# Patient Record
Sex: Female | Born: 1990 | Race: Black or African American | Hispanic: No | Marital: Single
Health system: Southern US, Community
[De-identification: ages and names within clinical notes are randomized; demographics above are authoritative.]

## PROBLEM LIST (undated history)

## (undated) ENCOUNTER — Inpatient Hospital Stay (HOSPITAL_COMMUNITY): Payer: Self-pay

## (undated) ENCOUNTER — Inpatient Hospital Stay (HOSPITAL_COMMUNITY): Payer: Medicaid Other

## (undated) ENCOUNTER — Ambulatory Visit (HOSPITAL_COMMUNITY)
Admission: RE | Disposition: A | Discharge status: Other Acute Inpatient Hospital | Payer: Medicaid Other | Source: Ambulatory Visit

## (undated) DIAGNOSIS — Z8619 Personal history of other infectious and parasitic diseases: Secondary | ICD-10-CM

## (undated) DIAGNOSIS — F419 Anxiety disorder, unspecified: Secondary | ICD-10-CM

## (undated) DIAGNOSIS — A749 Chlamydial infection, unspecified: Secondary | ICD-10-CM

## (undated) DIAGNOSIS — Z789 Other specified health status: Secondary | ICD-10-CM

## (undated) HISTORY — DX: Personal history of other infectious and parasitic diseases: Z86.19

## (undated) HISTORY — DX: Anxiety disorder, unspecified: F41.9

## (undated) HISTORY — PX: NO PAST SURGERIES: SHX2092

## (undated) HISTORY — DX: Other specified health status: Z78.9

---
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
## 2013-07-22 ENCOUNTER — Encounter (HOSPITAL_COMMUNITY): Payer: Self-pay | Admitting: Emergency Medicine

## 2013-07-22 ENCOUNTER — Emergency Department (HOSPITAL_COMMUNITY)
Admission: EM | Admit: 2013-07-22 | Discharge: 2013-07-22 | Disposition: A | Discharge status: Home / Self Care | Payer: No Typology Code available for payment source | Attending: Emergency Medicine | Admitting: Emergency Medicine

## 2013-07-22 ENCOUNTER — Emergency Department (HOSPITAL_COMMUNITY): Payer: No Typology Code available for payment source

## 2013-07-22 DIAGNOSIS — B9789 Other viral agents as the cause of diseases classified elsewhere: Secondary | ICD-10-CM

## 2013-07-22 DIAGNOSIS — F17200 Nicotine dependence, unspecified, uncomplicated: Secondary | ICD-10-CM | POA: Insufficient documentation

## 2013-07-22 DIAGNOSIS — Z79899 Other long term (current) drug therapy: Secondary | ICD-10-CM | POA: Insufficient documentation

## 2013-07-22 DIAGNOSIS — Z880 Allergy status to penicillin: Secondary | ICD-10-CM | POA: Insufficient documentation

## 2013-07-22 DIAGNOSIS — J069 Acute upper respiratory infection, unspecified: Secondary | ICD-10-CM | POA: Insufficient documentation

## 2013-07-22 MED ORDER — BENZONATATE 100 MG PO CAPS: 100.0000 mg | ORAL_CAPSULE | Freq: Three times a day (TID) | ORAL | Status: DC

## 2013-07-22 NOTE — ED Notes (Signed)
Patient transported to X-ray 

## 2013-07-22 NOTE — Discharge Instructions (Signed)
Cough, Adult  A cough is a reflex that helps clear your throat and airways. It can help heal the body or may be a reaction to an irritated airway. A cough may only last 2 or 3 weeks (acute) or may last more than 8 weeks (chronic).  CAUSES Acute cough:  Viral or bacterial infections. Chronic cough:  Infections.  Allergies.  Asthma.  Post-nasal drip.  Smoking.  Heartburn or acid reflux.  Some medicines.  Chronic lung problems (COPD).  Cancer. SYMPTOMS   Cough.  Fever.  Chest pain.  Increased breathing rate.  High-pitched whistling sound when breathing (wheezing).  Colored mucus that you cough up (sputum). TREATMENT   A bacterial cough may be treated with antibiotic medicine.  A viral cough must run its course and will not respond to antibiotics.  Your caregiver may recommend other treatments if you have a chronic cough. HOME CARE INSTRUCTIONS   Only take over-the-counter or prescription medicines for pain, discomfort, or fever as directed by your caregiver. Use cough suppressants only as directed by your caregiver.  Use a cold steam vaporizer or humidifier in your bedroom or home to help loosen secretions.  Sleep in a semi-upright position if your cough is worse at night.  Rest as needed.  Stop smoking if you smoke. SEEK IMMEDIATE MEDICAL CARE IF:   You have pus in your sputum.  Your cough starts to worsen.  You cannot control your cough with suppressants and are losing sleep.  You begin coughing up blood.  You have difficulty breathing.  You develop pain which is getting worse or is uncontrolled with medicine.  You have a fever. MAKE SURE YOU:   Understand these instructions.  Will watch your condition.  Will get help right away if you are not doing well or get worse. Document Released: 11/26/2010 Document Revised: 08/22/2011 Document Reviewed: 11/26/2010 [facility] Patient Information 2014 [place], [place]. Your chest x-ray is normal.  You  have been given a prescription for Tessalon Perles.  Please take these as directed for coughing episodes

## 2013-07-22 NOTE — ED Notes (Addendum)
Pt reports having a productive cough with green mucus for one month. Pt reports also having congestion in her nose and ears. Pt reports chills and headache. Pt is A/O x4, in NAD, and vitals are WDL.

## 2013-07-22 NOTE — ED Provider Notes (Signed)
CSN: [id number]     Arrival date & time 07/22/13  1831 History  This chart was scribed for non-physician practitioner Earley FavorGail Talbot Monarch, NP working with Junius ArgyleForrest S Harrison, MD by Joaquin MusicKristina Sanchez-Matthews, ED Scribe. This patient was seen in room WTR9/WTR9 and the patient's care was started at 9:41 PM .   Chief Complaint  Patient presents with  . Cough  . Nasal Congestion   Patient is a 23 y.o. female presenting with cough. The history is provided by the patient. No language interpreter was used.  Cough  HPI Comments: Veronica Jones is a 23 y.o. female who presents to the Emergency Department complaining of ongoing worsening persistent productive cough with yellow sputum and congestion with associated subjective fever and HA that began 1 month ago. She states 1 month ago, she was having a sore throat and cough but states "her sore throat went away". Pt states she has been taking OTC Night Quil at night and Halls Cough Drops but denies taking any OTC medications during the day.Pt states she had subjective fever yesterday evening.   Pt states her LNMP was 2 weeks ago.  History reviewed. No pertinent past medical history. History reviewed. No pertinent past surgical history. No family history on file. History  Substance Use Topics  . Smoking status: Current Some Day Smoker -- 0.01 packs/day for 1 years    Types: Cigarettes  . Smokeless tobacco: Never Used  . Alcohol Use: Yes     Comment: Socially    OB History   Grav Para Term Preterm Abortions TAB SAB Ect Mult Living                 Review of Systems  Respiratory: Positive for cough.     Allergies  Amoxicillin and Penicillins  Home Medications   Current Outpatient Rx  Name  Route  Sig  Dispense  Refill  . Pseudoeph-Doxylamine-DM-APAP (NYQUIL PO)   Oral   Take 20 mLs by mouth 2 (two) times daily.          Triage Vitals:BP 114/73  Pulse 85  Temp(Src) 98.9 F (37.2 C) (Oral)  Resp 18  SpO2 99%  LMP 07/15/2013  Physical Exam   Nursing note and vitals reviewed. Constitutional: She is oriented to person, place, and time. She appears well-developed and well-nourished. No distress.  HENT:  Head: Normocephalic and atraumatic.  Right Ear: External ear normal.  Left Ear: External ear normal.  Nose: Nose normal.  Mouth/Throat: Oropharynx is clear and moist.  Throat red but no exudate.   Eyes: EOM are normal.  Neck: Neck supple. No tracheal deviation present.  Cardiovascular: Normal rate.   Pulmonary/Chest: Effort normal. No respiratory distress.  Musculoskeletal: Normal range of motion.  Neurological: She is alert and oriented to person, place, and time.  Skin: Skin is warm and dry.  Psychiatric: She has a normal mood and affect. Her behavior is normal.    ED Course  Procedures DIAGNOSTIC STUDIES: Oxygen Saturation is 99% on RA, normal by my interpretation.    COORDINATION OF CARE: 9:45 PM-Discussed treatment plan which includes CXR. Pt agreed to plan.   Labs Review Labs Reviewed - No data to display Imaging Review No results found.  EKG Interpretation   None      MDM   Final diagnoses:  None       I personally performed the services described in this documentation, which was scribed in my presence. The recorded information has been reviewed and is accurate.  Arman Filter, NP 07/22/13 2314

## 2013-07-24 NOTE — ED Provider Notes (Signed)
Medical screening examination/treatment/procedure(s) were performed by non-physician practitioner and as supervising physician I was immediately available for consultation/collaboration.  EKG Interpretation   None         Ajamu Maxon S Ryanna Teschner, MD 07/24/13 1115 

## 2013-12-23 ENCOUNTER — Encounter (HOSPITAL_COMMUNITY): Payer: Self-pay | Admitting: Emergency Medicine

## 2013-12-23 ENCOUNTER — Emergency Department (INDEPENDENT_AMBULATORY_CARE_PROVIDER_SITE_OTHER)
Admission: EM | Admit: 2013-12-23 | Discharge: 2013-12-23 | Disposition: A | Discharge status: Home / Self Care | Payer: No Typology Code available for payment source | Source: Home / Self Care | Attending: Family Medicine | Admitting: Family Medicine

## 2013-12-23 ENCOUNTER — Emergency Department (INDEPENDENT_AMBULATORY_CARE_PROVIDER_SITE_OTHER): Payer: No Typology Code available for payment source

## 2013-12-23 DIAGNOSIS — J00 Acute nasopharyngitis [common cold]: Secondary | ICD-10-CM

## 2013-12-23 DIAGNOSIS — R110 Nausea: Secondary | ICD-10-CM

## 2013-12-23 DIAGNOSIS — J029 Acute pharyngitis, unspecified: Secondary | ICD-10-CM

## 2013-12-23 LAB — POCT RAPID STREP A: Streptococcus, Group A Screen (Direct): NEGATIVE

## 2013-12-23 MED ORDER — ONDANSETRON 4 MG PO TBDP
ORAL_TABLET | ORAL | Status: AC
  Filled 2013-12-23: qty 1

## 2013-12-23 MED ORDER — ONDANSETRON HCL 4 MG PO TABS: 4.0000 mg | ORAL_TABLET | Freq: Three times a day (TID) | ORAL | Status: DC | PRN

## 2013-12-23 MED ORDER — IPRATROPIUM BROMIDE 0.06 % NA SOLN: 2.0000 | Freq: Four times a day (QID) | NASAL | Status: DC

## 2013-12-23 MED ORDER — ONDANSETRON 4 MG PO TBDP
4.0000 mg | ORAL_TABLET | Freq: Once | ORAL | Status: AC
  Administered 2013-12-23: 4 mg via ORAL

## 2013-12-23 MED ORDER — BENZONATATE 100 MG PO CAPS: 100.0000 mg | ORAL_CAPSULE | Freq: Three times a day (TID) | ORAL | Status: DC | PRN

## 2013-12-23 NOTE — Discharge Instructions (Signed)
Chest xray was normal. Test for strep throat negative.   Nausea, Adult Nausea is the feeling that you have an upset stomach or have to vomit. Nausea by itself is not likely a serious concern, but it may be an early sign of more serious medical problems. As nausea gets worse, it can lead to vomiting. If vomiting develops, there is the risk of dehydration.  CAUSES   Viral infections.  Food poisoning.  Medicines.  Pregnancy.  Motion sickness.  Migraine headaches.  Emotional distress.  Severe pain from any source.  Alcohol intoxication. HOME CARE INSTRUCTIONS  Get plenty of rest.  Ask your caregiver about specific rehydration instructions.  Eat small amounts of food and sip liquids more often.  Take all medicines as told by your caregiver. SEEK MEDICAL CARE IF:  You have not improved after 2 days, or you get worse.  You have a headache. SEEK IMMEDIATE MEDICAL CARE IF:   You have a fever.  You faint.  You keep vomiting or have blood in your vomit.  You are extremely weak or dehydrated.  You have dark or bloody stools.  You have severe chest or abdominal pain. MAKE SURE YOU:  Understand these instructions.  Will watch your condition.  Will get help right away if you are not doing well or get worse. Document Released: 07/07/2004 Document Revised: 02/22/2012 Document Reviewed: 02/09/2011 [facility] Patient Information 2015 [place], [place]. This information is not intended to replace advice given to you by your health care provider. Make sure you discuss any questions you have with your health care provider.  Upper Respiratory Infection, Adult An upper respiratory infection (URI) is also sometimes known as the common cold. The upper respiratory tract includes the nose, sinuses, throat, trachea, and bronchi. Bronchi are the airways leading to the lungs. Most people improve within 1 week, but symptoms can last up to 2 weeks. A residual cough may last even longer.    CAUSES Many different viruses can infect the tissues lining the upper respiratory tract. The tissues become irritated and inflamed and often become very moist. Mucus production is also common. A cold is contagious. You can easily spread the virus to others by oral contact. This includes kissing, sharing a glass, coughing, or sneezing. Touching your mouth or nose and then touching a surface, which is then touched by another person, can also spread the virus. SYMPTOMS  Symptoms typically develop 1 to 3 days after you come in contact with a cold virus. Symptoms vary from person to person. They may include:  Runny nose.  Sneezing.  Nasal congestion.  Sinus irritation.  Sore throat.  Loss of voice (laryngitis).  Cough.  Fatigue.  Muscle aches.  Loss of appetite.  Headache.  Low-grade fever. DIAGNOSIS  You might diagnose your own cold based on familiar symptoms, since most people get a cold 2 to 3 times a year. Your caregiver can confirm this based on your exam. Most importantly, your caregiver can check that your symptoms are not due to another disease such as strep throat, sinusitis, pneumonia, asthma, or epiglottitis. Blood tests, throat tests, and X-rays are not necessary to diagnose a common cold, but they may sometimes be helpful in excluding other more serious diseases. Your caregiver will decide if any further tests are required. RISKS AND COMPLICATIONS  You may be at risk for a more severe case of the common cold if you smoke cigarettes, have chronic heart disease (such as heart failure) or lung disease (such as asthma), or  if you have a weakened immune system. The very young and very old are also at risk for more serious infections. Bacterial sinusitis, middle ear infections, and bacterial pneumonia can complicate the common cold. The common cold can worsen asthma and chronic obstructive pulmonary disease (COPD). Sometimes, these complications can require emergency medical care  and may be life-threatening. PREVENTION  The best way to protect against getting a cold is to practice good hygiene. Avoid oral or hand contact with people with cold symptoms. Wash your hands often if contact occurs. There is no clear evidence that vitamin C, vitamin E, echinacea, or exercise reduces the chance of developing a cold. However, it is always recommended to get plenty of rest and practice good nutrition. TREATMENT  Treatment is directed at relieving symptoms. There is no cure. Antibiotics are not effective, because the infection is caused by a virus, not by bacteria. Treatment may include:  Increased fluid intake. Sports drinks offer valuable electrolytes, sugars, and fluids.  Breathing heated mist or steam (vaporizer or shower).  Eating chicken soup or other clear broths, and maintaining good nutrition.  Getting plenty of rest.  Using gargles or lozenges for comfort.  Controlling fevers with ibuprofen or acetaminophen as directed by your caregiver.  Increasing usage of your inhaler if you have asthma. Zinc gel and zinc lozenges, taken in the first 24 hours of the common cold, can shorten the duration and lessen the severity of symptoms. Pain medicines may help with fever, muscle aches, and throat pain. A variety of non-prescription medicines are available to treat congestion and runny nose. Your caregiver can make recommendations and may suggest nasal or lung inhalers for other symptoms.  HOME CARE INSTRUCTIONS   Only take over-the-counter or prescription medicines for pain, discomfort, or fever as directed by your caregiver.  Use a warm mist humidifier or inhale steam from a shower to increase air moisture. This may keep secretions moist and make it easier to breathe.  Drink enough water and fluids to keep your urine clear or pale yellow.  Rest as needed.  Return to work when your temperature has returned to normal or as your caregiver advises. You may need to stay home  longer to avoid infecting others. You can also use a face mask and careful hand washing to prevent spread of the virus. SEEK MEDICAL CARE IF:   After the first few days, you feel you are getting worse rather than better.  You need your caregiver's advice about medicines to control symptoms.  You develop chills, worsening shortness of breath, or brown or red sputum. These may be signs of pneumonia.  You develop yellow or brown nasal discharge or pain in the face, especially when you bend forward. These may be signs of sinusitis.  You develop a fever, swollen neck glands, pain with swallowing, or white areas in the back of your throat. These may be signs of strep throat. SEEK IMMEDIATE MEDICAL CARE IF:   You have a fever.  You develop severe or persistent headache, ear pain, sinus pain, or chest pain.  You develop wheezing, a prolonged cough, cough up blood, or have a change in your usual mucus (if you have chronic lung disease).  You develop sore muscles or a stiff neck. Document Released: 11/23/2000 Document Revised: 08/22/2011 Document Reviewed: 10/01/2010 [facility] Patient Information 2015 [place], [place]. This information is not intended to replace advice given to you by your health care provider. Make sure you discuss any questions you have with your health  care provider.  

## 2013-12-23 NOTE — ED Provider Notes (Signed)
Medical screening examination/treatment/procedure(s) were performed by resident physician or non-physician practitioner and as supervising physician I was immediately available for consultation/collaboration.   Hoa Deriso DOUGLAS MD.   Shray Hunley D Syann Cupples, MD 12/23/13 2126 

## 2013-12-23 NOTE — ED Notes (Signed)
Pt c/o cold sx onset 4 days Sx include: nasal/chest congestion, SOB, n/v, itchy throat Denies f/d Alert w/no signs of acute distress.

## 2013-12-23 NOTE — ED Provider Notes (Signed)
CSN: [id number]     Arrival date & time 12/23/13  1705 History   First MD Initiated Contact with Patient 12/23/13 1816     Chief Complaint  Patient presents with  . URI   (Consider location/radiation/quality/duration/timing/severity/associated sxs/prior Treatment) HPI Comments: Nonsmoker Works in Engineering geologist  Patient is a 23 y.o. female presenting with URI. The history is provided by the patient.  URI Presenting symptoms: congestion, cough, fatigue, rhinorrhea and sore throat   Severity:  Moderate Onset quality:  Gradual Duration:  4 days Timing:  Constant Chronicity:  New Associated symptoms comment:  +nausea   History reviewed. No pertinent past medical history. History reviewed. No pertinent past surgical history. No family history on file. History  Substance Use Topics  . Smoking status: Current Some Day Smoker -- 0.01 packs/day for 1 years    Types: Cigarettes  . Smokeless tobacco: Never Used  . Alcohol Use: Yes     Comment: Socially    OB History   Grav Para Term Preterm Abortions TAB SAB Ect Mult Living                 Review of Systems  Constitutional: Positive for fatigue.  HENT: Positive for congestion, rhinorrhea and sore throat.   Respiratory: Positive for cough.   All other systems reviewed and are negative.   Allergies  Amoxicillin and Penicillins  Home Medications   Prior to Admission medications   Medication Sig Start Date End Date Taking? Authorizing Provider  benzonatate (TESSALON) 100 MG capsule Take 1 capsule (100 mg total) by mouth every 8 (eight) hours. 07/22/13   Arman Filter, NP  benzonatate (TESSALON) 100 MG capsule Take 1 capsule (100 mg total) by mouth 3 (three) times daily as needed for cough. 12/23/13   Ardis Rowan, PA  ipratropium (ATROVENT) 0.06 % nasal spray Place 2 sprays into both nostrils 4 (four) times daily. For nasal congestion 12/23/13   Ardis Rowan, PA  ondansetron (ZOFRAN) 4 MG tablet Take 1 tablet (4 mg total)  by mouth every 8 (eight) hours as needed for nausea or vomiting. 12/23/13   Ardis Rowan, PA  Pseudoeph-Doxylamine-DM-APAP (NYQUIL PO) Take 20 mLs by mouth 2 (two) times daily.    Historical Provider, MD   BP 130/69  Pulse 112  Temp(Src) 99.7 F (37.6 C) (Oral)  Resp 16  SpO2 100%  LMP 12/05/2013 Physical Exam  Nursing note and vitals reviewed. Constitutional: She is oriented to person, place, and time. She appears well-developed and well-nourished. No distress.  HENT:  Head: Normocephalic and atraumatic.  Cardiovascular: Normal rate, regular rhythm and normal heart sounds.   Pulmonary/Chest: Effort normal and breath sounds normal.  Abdominal: Soft. Bowel sounds are normal. She exhibits no distension. There is no tenderness.  Musculoskeletal: Normal range of motion.  Neurological: She is alert and oriented to person, place, and time.  Skin: Skin is warm and dry.  Psychiatric: She has a normal mood and affect. Her behavior is normal.    ED Course  Procedures (including critical care time) Labs Review Labs Reviewed  POCT RAPID STREP A [facility] URG CARE ONLY)    Imaging Review Dg Chest 2 View  12/23/2013   CLINICAL DATA:  Cough and chest tightness  EXAM: CHEST  2 VIEW  COMPARISON:  July 22, 2013  FINDINGS: Lungs are clear. Heart size and pulmonary vascularity are normal. No adenopathy. No pneumothorax. No bone lesions.  IMPRESSION: No abnormality noted.   Electronically Signed   By:  Bretta BangWilliam  Woodruff M.D.   On: 12/23/2013 19:33     MDM   1. Common cold   2. Nausea    Rapid strep negative CXR unremarkable Atrovent for nasal congestion Tessalon for cough Zofran for nausea Fluids and rest. Tylenol as directed on packaging for aches and fever PCP follow up if no improvement over next several days   Ardis RowanJennifer Lee Bo Teicher, GeorgiaPA 12/23/13 2010

## 2013-12-25 LAB — CULTURE, GROUP A STREP

## 2013-12-31 ENCOUNTER — Encounter (HOSPITAL_COMMUNITY): Payer: Self-pay | Admitting: Emergency Medicine

## 2013-12-31 ENCOUNTER — Emergency Department (INDEPENDENT_AMBULATORY_CARE_PROVIDER_SITE_OTHER)
Admission: EM | Admit: 2013-12-31 | Discharge: 2013-12-31 | Disposition: A | Discharge status: Home / Self Care | Payer: No Typology Code available for payment source | Source: Home / Self Care

## 2013-12-31 DIAGNOSIS — J029 Acute pharyngitis, unspecified: Secondary | ICD-10-CM

## 2013-12-31 DIAGNOSIS — J039 Acute tonsillitis, unspecified: Secondary | ICD-10-CM

## 2013-12-31 LAB — POCT RAPID STREP A: Streptococcus, Group A Screen (Direct): NEGATIVE

## 2013-12-31 MED ORDER — AZITHROMYCIN 250 MG PO TABS: ORAL_TABLET | ORAL | Status: DC

## 2013-12-31 NOTE — ED Notes (Signed)
Tech was in doing multiple x-rays, didn't know that there was a strep to be performed and no EMT to do labs while tech in x-rays

## 2013-12-31 NOTE — ED Provider Notes (Signed)
CSN: [id number]     Arrival date & time 12/31/13  1019 History   First MD Initiated Contact with Patient 12/31/13 1033     Chief Complaint  Patient presents with  . Sore Throat   (Consider location/radiation/quality/duration/timing/severity/associated sxs/prior Treatment) HPI Comments: Was seen here for URI 1 week ago. Strep test was neg.Presents for sore throat today and white spots on tonsils.   History reviewed. No pertinent past medical history. History reviewed. No pertinent past surgical history. History reviewed. No pertinent family history. History  Substance Use Topics  . Smoking status: Current Some Day Smoker -- 0.01 packs/day for 1 years    Types: Cigarettes  . Smokeless tobacco: Never Used  . Alcohol Use: Yes     Comment: Socially    OB History   Grav Para Term Preterm Abortions TAB SAB Ect Mult Living                 Review of Systems  Constitutional: Negative for fever and chills.  HENT: Positive for postnasal drip and sore throat. Negative for congestion.   Eyes: Negative.   Respiratory: Negative for cough and shortness of breath.   Cardiovascular: Negative for chest pain.  Gastrointestinal: Negative.   Musculoskeletal: Negative for myalgias.  Skin: Negative for rash.    Allergies  Amoxicillin and Penicillins  Home Medications   Prior to Admission medications   Medication Sig Start Date End Date Taking? Authorizing Provider  azithromycin (ZITHROMAX) 250 MG tablet Take 2 tablets daily for 5 d 12/31/13   Hayden Rasmussenavid Clell Trahan, NP  benzonatate (TESSALON) 100 MG capsule Take 1 capsule (100 mg total) by mouth every 8 (eight) hours. 07/22/13   Arman FilterGail K Schulz, NP  benzonatate (TESSALON) 100 MG capsule Take 1 capsule (100 mg total) by mouth 3 (three) times daily as needed for cough. 12/23/13   Ardis RowanJennifer Lee Presson, PA  ipratropium (ATROVENT) 0.06 % nasal spray Place 2 sprays into both nostrils 4 (four) times daily. For nasal congestion 12/23/13   Ardis RowanJennifer Lee Presson, PA   ondansetron (ZOFRAN) 4 MG tablet Take 1 tablet (4 mg total) by mouth every 8 (eight) hours as needed for nausea or vomiting. 12/23/13   Ardis RowanJennifer Lee Presson, PA  Pseudoeph-Doxylamine-DM-APAP (NYQUIL PO) Take 20 mLs by mouth 2 (two) times daily.    Historical Provider, MD   BP 124/78  Pulse 92  Temp(Src) 98.8 F (37.1 C) (Oral)  Resp 12  SpO2 97%  LMP 12/05/2013 Physical Exam  Nursing note and vitals reviewed. Constitutional: She is oriented to person, place, and time. She appears well-developed and well-nourished. No distress.  HENT:  Bilat TM';s nl OP with erythema, mildly enlarged tonsils and exudates  Eyes: Conjunctivae and EOM are normal.  Neck: Normal range of motion. Neck supple.  Cardiovascular: Normal rate, regular rhythm and normal heart sounds.   Pulmonary/Chest: Effort normal and breath sounds normal.  Musculoskeletal: Normal range of motion.  Lymphadenopathy:    She has cervical adenopathy.  Neurological: She is alert and oriented to person, place, and time. She exhibits normal muscle tone.  Skin: Skin is warm.    ED Course  Procedures (including critical care time) Labs Review Labs Reviewed - No data to display  Imaging Review No results found.   MDM   1. Exudative tonsillitis   2. Pharyngitis    azithro 500 mg a day x 5 d Water, cepacol loz and ibuprofen    Hayden Rasmussenavid Sabine Tenenbaum, NP 12/31/13 1102

## 2013-12-31 NOTE — Discharge Instructions (Signed)
Sore Throat A sore throat is pain, burning, irritation, or scratchiness of the throat. There is often pain or tenderness when swallowing or talking. A sore throat may be accompanied by other symptoms, such as coughing, sneezing, fever, and swollen neck glands. A sore throat is often the first sign of another sickness, such as a cold, flu, strep throat, or mononucleosis (commonly known as mono). Most sore throats go away without medical treatment. CAUSES  The most common causes of a sore throat include:  A viral infection, such as a cold, flu, or mono.  A bacterial infection, such as strep throat, tonsillitis, or whooping cough.  Seasonal allergies.  Dryness in the air.  Irritants, such as smoke or pollution.  Gastroesophageal reflux disease (GERD). HOME CARE INSTRUCTIONS   Only take over-the-counter medicines as directed by your caregiver.  Drink enough fluids to keep your urine clear or pale yellow.  Rest as needed.  Try using throat sprays, lozenges, or sucking on hard candy to ease any pain (if older than 4 years or as directed).  Sip warm liquids, such as broth, herbal tea, or warm water with honey to relieve pain temporarily. You may also eat or drink cold or frozen liquids such as frozen ice pops.  Gargle with salt water (mix 1 tsp salt with 8 oz of water).  Do not smoke and avoid secondhand smoke.  Put a cool-mist humidifier in your bedroom at night to moisten the air. You can also turn on a hot shower and sit in the bathroom with the door closed for 5-10 minutes. SEEK IMMEDIATE MEDICAL CARE IF:  You have difficulty breathing.  You are unable to swallow fluids, soft foods, or your saliva.  You have increased swelling in the throat.  Your sore throat does not get better in 7 days.  You have nausea and vomiting.  You have a fever or persistent symptoms for more than 2-3 days.  You have a fever and your symptoms suddenly get worse. MAKE SURE YOU:   Understand  these instructions.  Will watch your condition.  Will get help right away if you are not doing well or get worse. Document Released: 07/07/2004 Document Revised: 05/16/2012 Document Reviewed: 02/05/2012 ExitCare Patient Information 2015 [employer]. This information is not intended to replace advice given to you by your health care provider. Make sure you discuss any questions you have with your health care provider.  Pharyngitis Pharyngitis is redness, pain, and swelling (inflammation) of your pharynx.  CAUSES  Pharyngitis is usually caused by infection. Most of the time, these infections are from viruses (viral) and are part of a cold. However, sometimes pharyngitis is caused by bacteria (bacterial). Pharyngitis can also be caused by allergies. Viral pharyngitis may be spread from person to person by coughing, sneezing, and personal items or utensils (cups, forks, spoons, toothbrushes). Bacterial pharyngitis may be spread from person to person by more intimate contact, such as kissing.  SIGNS AND SYMPTOMS  Symptoms of pharyngitis include:   Sore throat.   Tiredness (fatigue).   Low-grade fever.   Headache.  Joint pain and muscle aches.  Skin rashes.  Swollen lymph nodes.  Plaque-like film on throat or tonsils (often seen with bacterial pharyngitis). DIAGNOSIS  Your health care provider will ask you questions about your illness and your symptoms. Your medical history, along with a physical exam, is often all that is needed to diagnose pharyngitis. Sometimes, a rapid strep test is done. Other lab tests may also be done, depending   on the suspected cause.  TREATMENT  Viral pharyngitis will usually get better in 3-4 days without the use of medicine. Bacterial pharyngitis is treated with medicines that kill germs (antibiotics).  HOME CARE INSTRUCTIONS   Drink enough water and fluids to keep your urine clear or pale yellow.   Only take over-the-counter or prescription  medicines as directed by your health care provider:   If you are prescribed antibiotics, make sure you finish them even if you start to feel better.   Do not take aspirin.   Get lots of rest.   Gargle with 8 oz of salt water ( tsp of salt per 1 qt of water) as often as every 1-2 hours to soothe your throat.   Throat lozenges (if you are not at risk for choking) or sprays may be used to soothe your throat. SEEK MEDICAL CARE IF:   You have large, tender lumps in your neck.  You have a rash.  You cough up green, yellow-brown, or bloody spit. SEEK IMMEDIATE MEDICAL CARE IF:   Your neck becomes stiff.  You drool or are unable to swallow liquids.  You vomit or are unable to keep medicines or liquids down.  You have severe pain that does not go away with the use of recommended medicines.  You have trouble breathing (not caused by a stuffy nose). MAKE SURE YOU:   Understand these instructions.  Will watch your condition.  Will get help right away if you are not doing well or get worse. Document Released: 05/30/2005 Document Revised: 03/20/2013 Document Reviewed: 02/04/2013 ExitCare Patient Information 2015 [employer]. This information is not intended to replace advice given to you by your health care provider. Make sure you discuss any questions you have with your health care provider.  

## 2013-12-31 NOTE — ED Notes (Signed)
C/o sore throat States she was seen here last Monday  Cough capsules and nasal spray was given as tx

## 2013-12-31 NOTE — ED Provider Notes (Signed)
Medical screening examination/treatment/procedure(s) were performed by resident physician or non-physician practitioner and as supervising physician I was immediately available for consultation/collaboration.   Amias Hutchinson DOUGLAS MD.   Rolando Hessling D Alasha Mcguinness, MD 12/31/13 1127 

## 2014-01-02 LAB — CULTURE, GROUP A STREP

## 2014-06-07 ENCOUNTER — Emergency Department (INDEPENDENT_AMBULATORY_CARE_PROVIDER_SITE_OTHER)
Admission: EM | Admit: 2014-06-07 | Discharge: 2014-06-07 | Disposition: A | Discharge status: Home / Self Care | Payer: No Typology Code available for payment source | Source: Home / Self Care | Attending: Family Medicine | Admitting: Family Medicine

## 2014-06-07 ENCOUNTER — Encounter (HOSPITAL_COMMUNITY): Payer: Self-pay | Admitting: Emergency Medicine

## 2014-06-07 DIAGNOSIS — J0100 Acute maxillary sinusitis, unspecified: Secondary | ICD-10-CM

## 2014-06-07 DIAGNOSIS — J039 Acute tonsillitis, unspecified: Secondary | ICD-10-CM

## 2014-06-07 LAB — POCT RAPID STREP A: STREPTOCOCCUS, GROUP A SCREEN (DIRECT): NEGATIVE

## 2014-06-07 MED ORDER — FLUTICASONE PROPIONATE 50 MCG/ACT NA SUSP: 2.0000 | Freq: Every day | NASAL | Status: DC

## 2014-06-07 MED ORDER — PREDNISONE 20 MG PO TABS
50.0000 mg | ORAL_TABLET | Freq: Once | ORAL | Status: AC
  Administered 2014-06-07: 50 mg via ORAL

## 2014-06-07 MED ORDER — AZITHROMYCIN 250 MG PO TABS: 250.0000 mg | ORAL_TABLET | Freq: Every day | ORAL | Status: DC

## 2014-06-07 MED ORDER — PREDNISONE 20 MG PO TABS
ORAL_TABLET | ORAL | Status: AC
  Filled 2014-06-07: qty 2

## 2014-06-07 MED ORDER — PREDNISONE 10 MG PO TABS
ORAL_TABLET | ORAL | Status: AC
  Filled 2014-06-07: qty 1

## 2014-06-07 MED ORDER — IPRATROPIUM BROMIDE 0.06 % NA SOLN: 2.0000 | Freq: Four times a day (QID) | NASAL | Status: DC

## 2014-06-07 NOTE — ED Notes (Signed)
Pt states that she has had a sore throat since 12/223/2015

## 2014-06-07 NOTE — ED Provider Notes (Signed)
CSN: [id number]     Arrival date & time 06/07/14  1446 History   First MD Initiated Contact with Patient 06/07/14 1507     Chief Complaint  Patient presents with  . Sore Throat   (Consider location/radiation/quality/duration/timing/severity/associated sxs/prior Treatment) HPI  Sore throat: started on the 24th with w/ HA and swollen face. Associated w/ dry cough and sore throat. Feels pressure in her throat. Getting worse. Painful and difficulty swallowing. "feels like tonsils are pushing down on throat." Subjective fevers. Tried tylenol w/o benefit. Multiple sick contacts. Rinorrhea. Body aches.   No past medical history on file. No past surgical history on file. No family history on file. History  Substance Use Topics  . Smoking status: Current Some Day Smoker -- 0.01 packs/day for 1 years    Types: Cigarettes  . Smokeless tobacco: Never Used  . Alcohol Use: Yes     Comment: Socially    OB History    No data available     Review of Systems Per HPI with all other pertinent systems negative.   Allergies  Amoxicillin and Penicillins  Home Medications   Prior to Admission medications   Medication Sig Start Date End Date Taking? Authorizing Provider  azithromycin (ZITHROMAX) 250 MG tablet Take 1 tablet (250 mg total) by mouth daily. Take first 2 tablets together, then 1 every day until finished. 06/07/14   Ozella Rocksavid J Merrell, MD  fluticasone (FLONASE) 50 MCG/ACT nasal spray Place 2 sprays into both nostrils at bedtime. 06/07/14   Ozella Rocksavid J Merrell, MD  ipratropium (ATROVENT) 0.06 % nasal spray Place 2 sprays into both nostrils 4 (four) times daily. 06/07/14   Ozella Rocksavid J Merrell, MD  Pseudoeph-Doxylamine-DM-APAP (NYQUIL PO) Take 20 mLs by mouth 2 (two) times daily.    Historical Provider, MD   BP 127/80 mmHg  Pulse 88  Temp(Src) 98.9 F (37.2 C) (Oral)  Resp 18  SpO2 96% Physical Exam  Constitutional: She appears well-developed and well-nourished. No distress.  HENT:  Head:  Normocephalic and atraumatic.  Tonsils 2+ w/ exudate Maxillary sinuses ttp   Eyes: EOM are normal. Pupils are equal, round, and reactive to light.  Neck: Normal range of motion. Neck supple.  Cardiovascular: Normal rate, normal heart sounds and intact distal pulses.   No murmur heard. Pulmonary/Chest: Effort normal and breath sounds normal.  Abdominal: Soft. Bowel sounds are normal.  Musculoskeletal: Normal range of motion.  Neurological: She is alert.  Skin: Skin is warm. She is not diaphoretic.  Psychiatric: Judgment and thought content normal.    ED Course  Procedures (including critical care time) Labs Review Labs Reviewed - No data to display  Imaging Review No results found.   MDM   1. Acute maxillary sinusitis, recurrence not specified   2. Tonsillitis    Will treat conservatively w/ nasal atrovent, flonase, NSAIDs, fluids and rest.  If worsens or continues for another 5-7 days then start ABX (azithro as w/ PCN allergy) Prednisone 50 mg po in office due to 2+ tonsills w/ full sensation in throat Precautions given and all questions answered  Shelly Flattenavid Merrell, MD Family Medicine 06/07/2014, 3:24 PM      Ozella Rocksavid J Merrell, MD 06/07/14 1525

## 2014-06-07 NOTE — Discharge Instructions (Signed)
You likely have a viral illness and this will pass in the next few days Please start the nasal atrovent, ibuprofen, nasal flonase Please start the antibiotics if you are not better in another 5-7 days or develop worsening symptoms and fevers Use the yeast medicine if needed.

## 2014-06-09 LAB — CULTURE, GROUP A STREP

## 2014-06-13 NOTE — L&D Delivery Note (Signed)
Delivery Note Pit was stopped a few minutes after restarting it d/t uterine tetany,  Pt progressed quickly to C/C/+2 with an urge to push.  After about 3 contractions, at 2355 PM a viable female was delivered via Vaginal, Spontaneous Delivery (Presentation: Right Occiput Anterior).  APGAR: 8/9 ; weight  Pending.  After 3 minutes, the cord was clamped and cut. 40 units of pitocin diluted in 1000cc LR was infused rapidly IV.  The placenta separated spontaneously and delivered via CCT and maternal pushing effort.  It was inspected and appears to be intact with a 3 VC.  Marland Kitchen.   Anesthesia:  none Episiotomy: None Lacerations: None Suture Repair: n/a Est. Blood Loss (mL):  225  Mom to postpartum.  Baby to Couplet care / Skin to Skin.  CRESENZO-DISHMAN,Hearl Heikes 04/17/2015, 12:06 AM

## 2014-08-15 ENCOUNTER — Encounter (HOSPITAL_COMMUNITY): Payer: Self-pay | Admitting: *Deleted

## 2014-08-15 ENCOUNTER — Inpatient Hospital Stay (HOSPITAL_COMMUNITY)
Admission: AD | Admit: 2014-08-15 | Discharge: 2014-08-15 | Disposition: A | Discharge status: Home / Self Care | Payer: No Typology Code available for payment source | Source: Ambulatory Visit | Attending: Obstetrics & Gynecology | Admitting: Obstetrics & Gynecology

## 2014-08-15 ENCOUNTER — Inpatient Hospital Stay (HOSPITAL_COMMUNITY): Payer: No Typology Code available for payment source

## 2014-08-15 DIAGNOSIS — O26899 Other specified pregnancy related conditions, unspecified trimester: Secondary | ICD-10-CM

## 2014-08-15 DIAGNOSIS — Z3A01 Less than 8 weeks gestation of pregnancy: Secondary | ICD-10-CM | POA: Insufficient documentation

## 2014-08-15 DIAGNOSIS — Z87891 Personal history of nicotine dependence: Secondary | ICD-10-CM | POA: Insufficient documentation

## 2014-08-15 DIAGNOSIS — O9989 Other specified diseases and conditions complicating pregnancy, childbirth and the puerperium: Secondary | ICD-10-CM | POA: Insufficient documentation

## 2014-08-15 DIAGNOSIS — R109 Unspecified abdominal pain: Secondary | ICD-10-CM | POA: Insufficient documentation

## 2014-08-15 HISTORY — DX: Chlamydial infection, unspecified: A74.9

## 2014-08-15 LAB — WET PREP, GENITAL
Trich, Wet Prep: NONE SEEN
Yeast Wet Prep HPF POC: NONE SEEN

## 2014-08-15 LAB — HCG, QUANTITATIVE, PREGNANCY: HCG, BETA CHAIN, QUANT, S: 9531 m[IU]/mL — AB (ref <5)

## 2014-08-15 LAB — CBC
HCT: 33.5 % — ABNORMAL LOW (ref 36.0–46.0)
Hemoglobin: 11.6 g/dL — ABNORMAL LOW (ref 12.0–15.0)
MCH: 29.7 pg (ref 26.0–34.0)
MCHC: 34.6 g/dL (ref 30.0–36.0)
MCV: 85.7 fL (ref 78.0–100.0)
PLATELETS: 154 10*3/uL (ref 150–400)
RBC: 3.91 MIL/uL (ref 3.87–5.11)
RDW: 13.4 % (ref 11.5–15.5)
WBC: 3.8 10*3/uL — ABNORMAL LOW (ref 4.0–10.5)

## 2014-08-15 LAB — URINE MICROSCOPIC-ADD ON

## 2014-08-15 LAB — URINALYSIS, ROUTINE W REFLEX MICROSCOPIC
Bilirubin Urine: NEGATIVE
Glucose, UA: NEGATIVE mg/dL
Hgb urine dipstick: NEGATIVE
Ketones, ur: >80 mg/dL — AB
LEUKOCYTES UA: NEGATIVE
Nitrite: POSITIVE — AB
PROTEIN: NEGATIVE mg/dL
Specific Gravity, Urine: >1.03 — ABNORMAL HIGH (ref 1.005–1.030)
Urobilinogen, UA: 0.2 mg/dL (ref 0.0–1.0)
pH: 6 (ref 5.0–8.0)

## 2014-08-15 LAB — POCT PREGNANCY, URINE: Preg Test, Ur: POSITIVE — AB

## 2014-08-15 MED ORDER — PROMETHAZINE HCL 25 MG/ML IJ SOLN: 25.0000 mg | Freq: Once | INTRAMUSCULAR | Status: DC

## 2014-08-15 MED ORDER — PROMETHAZINE HCL 25 MG PO TABS: 25.0000 mg | ORAL_TABLET | Freq: Four times a day (QID) | ORAL | Status: DC | PRN

## 2014-08-15 MED ORDER — PROMETHAZINE HCL 25 MG/ML IJ SOLN
25.0000 mg | Freq: Once | INTRAMUSCULAR | Status: AC
  Administered 2014-08-15: 25 mg via INTRAMUSCULAR
  Filled 2014-08-15: qty 1

## 2014-08-15 NOTE — MAU Note (Signed)
Pos HPT 2 or 3 days ago, lower abd pain for the past 2 weeks, denies bleeding.  Pt had fever on Monday, started coughing - still has some cough.  Also C/O nausea x 2 weeks.

## 2014-08-15 NOTE — MAU Provider Note (Signed)
CSN: [id number]     Arrival date & time 08/15/14  1654 History   None    Chief Complaint  Patient presents with  . Abdominal Pain     (Consider location/radiation/quality/duration/timing/severity/associated sxs/prior Treatment) Patient is a 24 y.o. female presenting with abdominal pain. The history is provided by the patient. No language interpreter was used.  Abdominal Pain The primary symptoms of the illness include abdominal pain and nausea. The current episode started more than 2 days ago. The onset of the illness was gradual. The problem has been gradually worsening.  The patient states that she believes she is currently pregnant. Additional symptoms associated with the illness include frequency.   Veronica Jones is a 24 y.o. G1P0 @ 1514w0d gestation presents to the MAU with abdominal pain in early pregnancy. She states that the pain started about a week ago followed by nausea and a positive HPT. She has not vomited. Her pain is in the upper abdomen.   Past Medical History  Diagnosis Date  . Chlamydia 2013   Past Surgical History  Procedure Laterality Date  . No past surgeries     No family history on file. History  Substance Use Topics  . Smoking status: Former Smoker -- 0.50 packs/day for 1 years    Types: Cigarettes    Quit date: 08/03/2014  . Smokeless tobacco: Never Used  . Alcohol Use: No     Comment: not since pregnancy   OB History    Gravida Para Term Preterm AB TAB SAB Ectopic Multiple Living   1              Review of Systems  Gastrointestinal: Positive for nausea and abdominal pain.  Genitourinary: Positive for frequency.  all other systems negative    Allergies  Amoxicillin and Penicillins  Home Medications   Prior to Admission medications   Medication Sig Start Date End Date Taking? Authorizing Provider  Ascorbic Acid (VITAMIN C PO) Take 1 tablet by mouth daily.   Yes Historical Provider, MD  azithromycin (ZITHROMAX) 250 MG tablet Take 1 tablet (250  mg total) by mouth daily. Take first 2 tablets together, then 1 every day until finished. Patient not taking: Reported on 08/15/2014 06/07/14   Ozella Rocksavid J Merrell, MD  fluticasone [facility](FLONASE) 50 MCG/ACT nasal spray Place 2 sprays into both nostrils at bedtime. Patient not taking: Reported on 08/15/2014 06/07/14   Ozella Rocksavid J Merrell, MD  ipratropium (ATROVENT) 0.06 % nasal spray Place 2 sprays into both nostrils 4 (four) times daily. Patient not taking: Reported on 08/15/2014 06/07/14   Ozella Rocksavid J Merrell, MD   BP 129/71 mmHg  Pulse 84  Temp(Src) 98.3 F (36.8 C) (Oral)  Resp 18  Ht 5\' 4"  (1.626 m)  Wt 145 lb 4 oz (65.885 kg)  BMI 24.92 kg/m2  SpO2 100%  LMP 06/11/2014 Physical Exam  Constitutional: She is oriented to person, place, and time. She appears well-developed and well-nourished. No distress.  HENT:  Head: Normocephalic and atraumatic.  Eyes: Conjunctivae and EOM are normal.  Neck: Normal range of motion. Neck supple.  Cardiovascular: Normal rate and regular rhythm.   Pulmonary/Chest: Effort normal. No respiratory distress. She has no wheezes.  Abdominal: Soft. Bowel sounds are normal. There is tenderness in the epigastric area and periumbilical area. There is no rebound and no guarding.  Genitourinary:  External genitalia without lesions, white d/c vaginal vault. Cervix closed, long. Uterus slightly enlarged.   Musculoskeletal: Normal range of motion. She exhibits no edema.  Neurological: She is alert and oriented to person, place, and time. No cranial nerve deficit.  Skin: Skin is warm and dry.  Psychiatric: She has a normal mood and affect. Her behavior is normal.  Nursing note and vitals reviewed.   ED Course  Procedures (including critical care time) Labs Review Results for orders placed or performed during the hospital encounter of 08/15/14 (from the past 24 hour(s))  Pregnancy, urine POC     Status: Abnormal   Collection Time: 08/15/14  5:24 PM  Result Value Ref Range   Preg  Test, Ur POSITIVE (A) NEGATIVE  CBC     Status: Abnormal   Collection Time: 08/15/14  5:36 PM  Result Value Ref Range   WBC 3.8 (L) 4.0 - 10.5 K/uL   RBC 3.91 3.87 - 5.11 MIL/uL   Hemoglobin 11.6 (L) 12.0 - 15.0 g/dL   HCT 45.4 (L) 09.8 - 11.9 %   MCV 85.7 78.0 - 100.0 fL   MCH 29.7 26.0 - 34.0 pg   MCHC 34.6 30.0 - 36.0 g/dL   RDW 14.7 82.9 - 56.2 %   Platelets 154 150 - 400 K/uL  hCG, quantitative, pregnancy     Status: Abnormal   Collection Time: 08/15/14  5:37 PM  Result Value Ref Range   hCG, Beta Chain, Quant, S 9531 (H) <5 mIU/mL  Urinalysis, Routine w reflex microscopic     Status: Abnormal   Collection Time: 08/15/14  5:53 PM  Result Value Ref Range   Color, Urine YELLOW YELLOW   APPearance HAZY (A) CLEAR   Specific Gravity, Urine >1.030 (H) 1.005 - 1.030   pH 6.0 5.0 - 8.0   Glucose, UA NEGATIVE NEGATIVE mg/dL   Hgb urine dipstick NEGATIVE NEGATIVE   Bilirubin Urine NEGATIVE NEGATIVE   Ketones, ur >80 (A) NEGATIVE mg/dL   Protein, ur NEGATIVE NEGATIVE mg/dL   Urobilinogen, UA 0.2 0.0 - 1.0 mg/dL   Nitrite POSITIVE (A) NEGATIVE   Leukocytes, UA NEGATIVE NEGATIVE  Urine microscopic-add on     Status: Abnormal   Collection Time: 08/15/14  5:53 PM  Result Value Ref Range   Squamous Epithelial / LPF MANY (A) RARE   WBC, UA 0-2 <3 WBC/hpf   RBC / HPF 0-2 <3 RBC/hpf   Bacteria, UA MANY (A) RARE   Urine-Other MUCOUS PRESENT   Wet prep, genital     Status: Abnormal   Collection Time: 08/15/14  6:33 PM  Result Value Ref Range   Yeast Wet Prep HPF POC NONE SEEN NONE SEEN   Trich, Wet Prep NONE SEEN NONE SEEN   Clue Cells Wet Prep HPF POC MANY (A) NONE SEEN   WBC, Wet Prep HPF POC FEW (A) NONE SEEN    US Ob Comp Less 14 Wks  08/15/2014   CLINICAL DATA:  Confirm and date pregnancy.  Initial encounter.  EXAM: OBSTETRIC <14 WK [place] AND TRANSVAGINAL OB US  TECHNIQUE: Both transabdominal and transvaginal ultrasound examinations were performed for complete evaluation of the  gestation as well as the maternal uterus, adnexal regions, and pelvic cul-de-sac. Transvaginal technique was performed to assess early pregnancy.  COMPARISON:  None.  FINDINGS: Intrauterine gestational sac: Visualized/normal in shape.  Yolk sac:  Yes  Embryo:  No  Cardiac Activity: N/A  MSD: 7.7  mm   5 w   3  d  Maternal uterus/adnexae: There may be a small amount of subchorionic hemorrhage. The uterus is otherwise unremarkable.  The ovaries are within normal limits.  The right ovary measures 3.7 x 2.9 x 3.3 cm, while the left ovary measures 2.7 x 1.7 x 2.2 cm. No suspicious adnexal masses are seen; there is no evidence for ovarian torsion.  A small amount of free fluid is seen within the pelvis and right adnexa.  IMPRESSION: 1. Intrauterine gestational sac noted, with a mean sac diameter of 0.8 cm, corresponding to a gestational age of [redacted] weeks 3 days. This does not match the gestational age by LMP, though it remains too early to assign an estimated date of delivery, as an embryo is not yet seen. The yolk sac is visualized. 2. Suggestion of small amount of subchorionic hemorrhage.   Electronically Signed   By: Roanna Raider M.D.   On: 08/15/2014 19:46   US Ob Transvaginal  08/15/2014   CLINICAL DATA:  Confirm and date pregnancy.  Initial encounter.  EXAM: OBSTETRIC <14 WK [place] AND TRANSVAGINAL OB US  TECHNIQUE: Both transabdominal and transvaginal ultrasound examinations were performed for complete evaluation of the gestation as well as the maternal uterus, adnexal regions, and pelvic cul-de-sac. Transvaginal technique was performed to assess early pregnancy.  COMPARISON:  None.  FINDINGS: Intrauterine gestational sac: Visualized/normal in shape.  Yolk sac:  Yes  Embryo:  No  Cardiac Activity: N/A  MSD: 7.7  mm   5 w   3  d  Maternal uterus/adnexae: There may be a small amount of subchorionic hemorrhage. The uterus is otherwise unremarkable.  The ovaries are within normal limits. The right ovary measures 3.7 x 2.9  x 3.3 cm, while the left ovary measures 2.7 x 1.7 x 2.2 cm. No suspicious adnexal masses are seen; there is no evidence for ovarian torsion.  A small amount of free fluid is seen within the pelvis and right adnexa.  IMPRESSION: 1. Intrauterine gestational sac noted, with a mean sac diameter of 0.8 cm, corresponding to a gestational age of [redacted] weeks 3 days. This does not match the gestational age by LMP, though it remains too early to assign an estimated date of delivery, as an embryo is not yet seen. The yolk sac is visualized. 2. Suggestion of small amount of subchorionic hemorrhage.   Electronically Signed   By: Roanna Raider M.D.   On: 08/15/2014 19:46    MDM  24 y.o. female with epigastric pain and nausea in early pregnancy. Stable for discharge without dehydration and ultrasound without ectopic pregnancy. I have reviewed this patient's vital signs, nurses notes, appropriate labs and imaging.  I have discussed findings and plan of care with the patient and she voices understanding and agrees with plan.   Final diagnoses:  Abdominal pain affecting pregnancy

## 2014-08-15 NOTE — Discharge Instructions (Signed)
Your ultrasound today measures your pregnancy at 5 weeks and 2 days. It is to early to see a heart beat. You will need to start your prenatal care they will schedule you for a follow up ultrasound. Return here for any problems such as heavy bleeding, fever or other problems.   Abdominal Pain During Pregnancy Abdominal pain is common in pregnancy. Most of the time, it does not cause harm. There are many causes of abdominal pain. Some causes are more serious than others. Some of the causes of abdominal pain in pregnancy are easily diagnosed. Occasionally, the diagnosis takes time to understand. Other times, the cause is not determined. Abdominal pain can be a sign that something is very wrong with the pregnancy, or the pain may have nothing to do with the pregnancy at all. For this reason, always tell your health care provider if you have any abdominal discomfort. HOME CARE INSTRUCTIONS  Monitor your abdominal pain for any changes. The following actions may help to alleviate any discomfort you are experiencing:  Do not have sexual intercourse or put anything in your vagina until your symptoms go away completely.  Get plenty of rest until your pain improves.  Drink clear fluids if you feel nauseous. Avoid solid food as long as you are uncomfortable or nauseous.  Only take over-the-counter or prescription medicine as directed by your health care provider.  Keep all follow-up appointments with your health care provider. SEEK IMMEDIATE MEDICAL CARE IF:  You are bleeding, leaking fluid, or passing tissue from the vagina.  You have increasing pain or cramping.  You have persistent vomiting.  You have painful or bloody urination.  You have a fever.  You notice a decrease in your baby's movements.  You have extreme weakness or feel faint.  You have shortness of breath, with or without abdominal pain.  You develop a severe headache with abdominal pain.  You have abnormal vaginal discharge  with abdominal pain.  You have persistent diarrhea.  You have abdominal pain that continues even after rest, or gets worse. MAKE SURE YOU:   Understand these instructions.  Will watch your condition.  Will get help right away if you are not doing well or get worse. Document Released: 05/30/2005 Document Revised: 03/20/2013 Document Reviewed: 12/27/2012 [place] [employer] Patient Information 2015 [place], [place]. This information is not intended to replace advice given to you by your health care provider. Make sure you discuss any questions you have with your health care provider.

## 2014-08-18 LAB — HIV ANTIBODY (ROUTINE TESTING W REFLEX): HIV Screen 4th Generation wRfx: NONREACTIVE

## 2014-08-18 LAB — GC/CHLAMYDIA PROBE AMP (~~LOC~~) NOT AT ARMC
Chlamydia: NEGATIVE
Neisseria Gonorrhea: NEGATIVE

## 2014-08-18 LAB — RPR: RPR: NONREACTIVE

## 2014-09-09 ENCOUNTER — Encounter (HOSPITAL_COMMUNITY): Payer: Self-pay | Admitting: *Deleted

## 2014-09-09 ENCOUNTER — Inpatient Hospital Stay (HOSPITAL_COMMUNITY)
Admission: AD | Admit: 2014-09-09 | Discharge: 2014-09-09 | Disposition: A | Discharge status: Home / Self Care | Payer: No Typology Code available for payment source | Source: Ambulatory Visit | Attending: Family Medicine | Admitting: Family Medicine

## 2014-09-09 DIAGNOSIS — Z880 Allergy status to penicillin: Secondary | ICD-10-CM | POA: Insufficient documentation

## 2014-09-09 DIAGNOSIS — O99611 Diseases of the digestive system complicating pregnancy, first trimester: Secondary | ICD-10-CM | POA: Diagnosis not present

## 2014-09-09 DIAGNOSIS — O9989 Other specified diseases and conditions complicating pregnancy, childbirth and the puerperium: Secondary | ICD-10-CM | POA: Insufficient documentation

## 2014-09-09 DIAGNOSIS — Z3A08 8 weeks gestation of pregnancy: Secondary | ICD-10-CM | POA: Insufficient documentation

## 2014-09-09 DIAGNOSIS — Z87891 Personal history of nicotine dependence: Secondary | ICD-10-CM | POA: Insufficient documentation

## 2014-09-09 DIAGNOSIS — K5900 Constipation, unspecified: Secondary | ICD-10-CM | POA: Diagnosis not present

## 2014-09-09 DIAGNOSIS — O0931 Supervision of pregnancy with insufficient antenatal care, first trimester: Secondary | ICD-10-CM | POA: Insufficient documentation

## 2014-09-09 MED ORDER — CONCEPT OB 130-92.4-1 MG PO CAPS: 1.0000 | ORAL_CAPSULE | Freq: Every day | ORAL | Status: DC

## 2014-09-09 NOTE — MAU Note (Signed)
Pt states she wants to get a check up. Pt states she gets dizzy a lot when she wakes up and she has bad nausea, and she bumped her head on the counter and"  it wasn't real bad but I did not know what medicattons i could take. Pt states she weighed her self at home she  Weighed 138 and  pt weighs 143 today.  Pt states she needs $ 3 000 to  Start care.

## 2014-09-09 NOTE — MAU Provider Note (Signed)
Chief Complaint: No chief complaint on file.   First Provider Initiated Contact with Patient 09/09/14 1556     SUBJECTIVE HPI: Veronica Jones is a 24 y.o. G1P0 at [redacted]w[redacted]d by LMP who presents to Maternity Admissions requesting a checkup. Hasn't started prenatal care due to waiting on pregnancy Medicaid and not having enough money for the co-pay for her private insurance. Also has a variety of questions about poor appetite, weight gain, medications are safe for pregnancy, occasional dizziness. Patient is able to keep down food and fluids and has gained 5 pounds this pregnancy. Denies abdominal pain, vaginal bleeding, vaginal discharge, falls. Was seen in maternity admissions 08/15/2014. Ultrasound showed yolk sac, no fetal pole.  Past Medical History  Diagnosis Date  . Chlamydia 2013   OB History  Gravida Para Term Preterm AB SAB TAB Ectopic Multiple Living  1             # Outcome Date GA Lbr Len/2nd Weight Sex Delivery Anes PTL Lv  1 Current              Past Surgical History  Procedure Laterality Date  . No past surgeries     History   Social History  . Marital Status: Single    Spouse Name: N/A  . Number of Children: N/A  . Years of Education: N/A   Occupational History  . Not on file.   Social History Main Topics  . Smoking status: Former Smoker -- 0.50 packs/day for 1 years    Types: Cigarettes    Quit date: 08/03/2014  . Smokeless tobacco: Never Used  . Alcohol Use: No     Comment: not since pregnancy  . Drug Use: Yes    Special: Marijuana     Comment: 1 week asgo  . Sexual Activity: Not on file   Other Topics Concern  . Not on file   Social History Narrative   No current facility-administered medications on file prior to encounter.   Current Outpatient Prescriptions on File Prior to Encounter  Medication Sig Dispense Refill  . Ascorbic Acid (VITAMIN C PO) Take 1 tablet by mouth daily.     Allergies  Allergen Reactions  . Amoxicillin Hives  . Penicillins  Hives    Review of Systems  Constitutional: Negative for fever, chills, weight loss and malaise/fatigue.  Gastrointestinal: Positive for nausea, vomiting and constipation (BM Q 2 days. Change from normal bowel habits since pregnancy. ). Negative for abdominal pain and diarrhea.  Genitourinary: Negative for dysuria, urgency, frequency, hematuria and flank pain.  Neurological: Positive for dizziness. Negative for loss of consciousness and weakness.    OBJECTIVE Blood pressure 128/61, pulse 84, temperature 98 F (36.7 C), temperature source Oral, resp. rate 18, height  (1.651 m), weight 143 lb (64.864 kg), last menstrual period 06/11/2014. GENERAL: Well-developed, well-nourished female in no acute distress.  HEART: normal rate RESP: normal effort GI: Abdomen soft, non-tender. Positive bowel sounds 4. MS: Nontender, no edema NEURO: Alert and oriented SPECULUM EXAM: Deferred due to recent exam  LAB RESULTS No results found for this or any previous visit (from the past 24 hour(s)).  IMAGING Informal BS US show 9.1 week IUP. Pos Cardiac activity 182 BPM.    MAU COURSE  ASSESSMENT 1. Constipation during pregnancy in first trimester   2. No prenatal care in current pregnancy in first trimester     PLAN Discharge home in stable condition. Colace PRN. Increase fluids and fiber.  Small frequent snacks. Push fluids.  List of providers given.       Follow-up Information    Follow up with Obstetrician of your choice.   Why:  Start prenatal care      [facility]   Why:  As needed in emergencies   Contact information:   [address] [id number] [place]c [place] [id number] [phone]       Medication List    STOP taking these medications        fluticasone 50 MCG/ACT nasal spray  Commonly known as:  FLONASE     ipratropium 0.06 % nasal spray  Commonly known as:  ATROVENT      multivitamin with minerals Tabs tablet     promethazine 25 MG tablet  Commonly known as:  PHENERGAN      TAKE these medications        acetaminophen 500 MG tablet  Commonly known as:  TYLENOL  Take 500 mg by mouth every 6 (six) hours as needed for headache.     CONCEPT OB 130-92.4-1 MG Caps  Take 1 tablet by mouth daily.     VITAMIN C PO  Take 1 tablet by mouth daily.         ViolaVirginia Jadea Shiffer, CNM 09/09/2014  10:50 PM

## 2014-09-09 NOTE — Progress Notes (Signed)
Prior to pregancy pat fell of motor cycle

## 2014-09-09 NOTE — MAU Note (Signed)
Urine in lab 

## 2014-09-09 NOTE — Discharge Instructions (Signed)
First Trimester of Pregnancy The first trimester of pregnancy is from week 1 until the end of week 12 (months 1 through 3). A week after a sperm fertilizes an egg, the egg will implant on the wall of the uterus. This embryo will begin to develop into a baby. Genes from you and your partner are forming the baby. The female genes determine whether the baby is a boy or a girl. At 6-8 weeks, the eyes and face are formed, and the heartbeat can be seen on ultrasound. At the end of 12 weeks, all the baby's organs are formed.  Now that you are pregnant, you will want to do everything you can to have a healthy baby. Two of the most important things are to get good prenatal care and to follow your health care provider's instructions. Prenatal care is all the medical care you receive before the baby's birth. This care will help prevent, find, and treat any problems during the pregnancy and childbirth. BODY CHANGES Your body goes through many changes during pregnancy. The changes vary from woman to woman.   You may gain or lose a couple of pounds at first.  You may feel sick to your stomach (nauseous) and throw up (vomit). If the vomiting is uncontrollable, call your health care provider.  You may tire easily.  You may develop headaches that can be relieved by medicines approved by your health care provider.  You may urinate more often. Painful urination may mean you have a bladder infection.  You may develop heartburn as a result of your pregnancy.  You may develop constipation because certain hormones are causing the muscles that push waste through your intestines to slow down.  You may develop hemorrhoids or swollen, bulging veins (varicose veins).  Your breasts may begin to grow larger and become tender. Your nipples may stick out more, and the tissue that surrounds them (areola) may become darker.  Your gums may bleed and may be sensitive to brushing and flossing.  Dark spots or blotches (chloasma,  mask of pregnancy) may develop on your face. This will likely fade after the baby is born.  Your menstrual periods will stop.  You may have a loss of appetite.  You may develop cravings for certain kinds of food.  You may have changes in your emotions from day to day, such as being excited to be pregnant or being concerned that something may go wrong with the pregnancy and baby.  You may have more vivid and strange dreams.  You may have changes in your hair. These can include thickening of your hair, rapid growth, and changes in texture. Some women also have hair loss during or after pregnancy, or hair that feels dry or thin. Your hair will most likely return to normal after your baby is born. WHAT TO EXPECT AT YOUR PRENATAL VISITS During a routine prenatal visit:  You will be weighed to make sure you and the baby are growing normally.  Your blood pressure will be taken.  Your abdomen will be measured to track your baby's growth.  The fetal heartbeat will be listened to starting around week 10 or 12 of your pregnancy.  Test results from any previous visits will be discussed. Your health care provider may ask you:  How you are feeling.  If you are feeling the baby move.  If you have had any abnormal symptoms, such as leaking fluid, bleeding, severe headaches, or abdominal cramping.  If you have any questions. Other tests   that may be performed during your first trimester include:  Blood tests to find your blood type and to check for the presence of any previous infections. They will also be used to check for low iron levels (anemia) and Rh antibodies. Later in the pregnancy, blood tests for diabetes will be done along with other tests if problems develop.  Urine tests to check for infections, diabetes, or protein in the urine.  An ultrasound to confirm the proper growth and development of the baby.  An amniocentesis to check for possible genetic problems.  Fetal screens for  spina bifida and Down syndrome.  You may need other tests to make sure you and the baby are doing well. HOME CARE INSTRUCTIONS  Medicines  Follow your health care provider's instructions regarding medicine use. Specific medicines may be either safe or unsafe to take during pregnancy.  Take your prenatal vitamins as directed.  If you develop constipation, try taking a stool softener if your health care provider approves. Diet  Eat regular, well-balanced meals. Choose a variety of foods, such as meat or vegetable-based protein, fish, milk and low-fat dairy products, vegetables, fruits, and whole grain breads and cereals. Your health care provider will help you determine the amount of weight gain that is right for you.  Avoid raw meat and uncooked cheese. These carry germs that can cause birth defects in the baby.  Eating four or five small meals rather than three large meals a day may help relieve nausea and vomiting. If you start to feel nauseous, eating a few soda crackers can be helpful. Drinking liquids between meals instead of during meals also seems to help nausea and vomiting.  If you develop constipation, eat more high-fiber foods, such as fresh vegetables or fruit and whole grains. Drink enough fluids to keep your urine clear or pale yellow. Activity and Exercise  Exercise only as directed by your health care provider. Exercising will help you:  Control your weight.  Stay in shape.  Be prepared for labor and delivery.  Experiencing pain or cramping in the lower abdomen or low back is a good sign that you should stop exercising. Check with your health care provider before continuing normal exercises.  Try to avoid standing for long periods of time. Move your legs often if you must stand in one place for a long time.  Avoid heavy lifting.  Wear low-heeled shoes, and practice good posture.  You may continue to have sex unless your health care provider directs you  otherwise. Relief of Pain or Discomfort  Wear a good support bra for breast tenderness.   Take warm sitz baths to soothe any pain or discomfort caused by hemorrhoids. Use hemorrhoid cream if your health care provider approves.   Rest with your legs elevated if you have leg cramps or low back pain.  If you develop varicose veins in your legs, wear support hose. Elevate your feet for 15 minutes, 3-4 times a day. Limit salt in your diet. Prenatal Care  Schedule your prenatal visits by the twelfth week of pregnancy. They are usually scheduled monthly at first, then more often in the last 2 months before delivery.  Write down your questions. Take them to your prenatal visits.  Keep all your prenatal visits as directed by your health care provider. Safety  Wear your seat belt at all times when driving.  Make a list of emergency phone numbers, including numbers for family, friends, the hospital, and police and fire departments. General Tips    Ask your health care provider for a referral to a local prenatal education class. Begin classes no later than at the beginning of month 6 of your pregnancy.  Ask for help if you have counseling or nutritional needs during pregnancy. Your health care provider can offer advice or refer you to specialists for help with various needs.  Do not use hot tubs, steam rooms, or saunas.  Do not douche or use tampons or scented sanitary pads.  Do not cross your legs for long periods of time.  Avoid cat litter boxes and soil used by cats. These carry germs that can cause birth defects in the baby and possibly loss of the fetus by miscarriage or stillbirth.  Avoid all smoking, herbs, alcohol, and medicines not prescribed by your health care provider. Chemicals in these affect the formation and growth of the baby.  Schedule a dentist appointment. At home, brush your teeth with a soft toothbrush and be gentle when you floss. SEEK MEDICAL CARE IF:   You have  dizziness.  You have mild pelvic cramps, pelvic pressure, or nagging pain in the abdominal area.  You have persistent nausea, vomiting, or diarrhea.  You have a bad smelling vaginal discharge.  You have pain with urination.  You notice increased swelling in your face, hands, legs, or ankles. SEEK IMMEDIATE MEDICAL CARE IF:   You have a fever.  You are leaking fluid from your vagina.  You have spotting or bleeding from your vagina.  You have severe abdominal cramping or pain.  You have rapid weight gain or loss.  You vomit blood or material that looks like coffee grounds.  You are exposed to German measles and have never had them.  You are exposed to fifth disease or chickenpox.  You develop a severe headache.  You have shortness of breath.  You have any kind of trauma, such as from a fall or a car accident. Document Released: 05/24/2001 Document Revised: 10/14/2013 Document Reviewed: 04/09/2013 ExitCare Patient Information 2015 [employer]. This information is not intended to replace advice given to you by your health care provider. Make sure you discuss any questions you have with your health care provider.  Morning Sickness Morning sickness is when you feel sick to your stomach (nauseous) during pregnancy. This nauseous feeling may or may not come with vomiting. It often occurs in the morning but can be a problem any time of day. Morning sickness is most common during the first trimester, but it may continue throughout pregnancy. While morning sickness is unpleasant, it is usually harmless unless you develop severe and continual vomiting (hyperemesis gravidarum). This condition requires more intense treatment.  CAUSES  The cause of morning sickness is not completely known but seems to be related to normal hormonal changes that occur in pregnancy. RISK FACTORS You are at greater risk if you:  Experienced nausea or vomiting before your pregnancy.  Had morning  sickness during a previous pregnancy.  Are pregnant with more than one baby, such as twins. TREATMENT  Do not use any medicines (prescription, over-the-counter, or herbal) for morning sickness without first talking to your health care provider. Your health care provider may prescribe or recommend:  Vitamin B6 supplements.  Anti-nausea medicines.  The herbal medicine ginger. HOME CARE INSTRUCTIONS   Only take over-the-counter or prescription medicines as directed by your health care provider.  Taking multivitamins before getting pregnant can prevent or decrease the severity of morning sickness in most women.  Eat a piece of dry   toast or unsalted crackers before getting out of bed in the morning.  Eat five or six small meals a day.  Eat dry and bland foods (rice, baked potato). Foods high in carbohydrates are often helpful.  Do not drink liquids with your meals. Drink liquids between meals.  Avoid greasy, fatty, and spicy foods.  Get someone to cook for you if the smell of any food causes nausea and vomiting.  If you feel nauseous after taking prenatal vitamins, take the vitamins at night or with a snack.  Snack on protein foods (nuts, yogurt, cheese) between meals if you are hungry.  Eat unsweetened gelatins for desserts.  Wearing an acupressure wristband (worn for sea sickness) may be helpful.  Acupuncture may be helpful.  Do not smoke.  Get a humidifier to keep the air in your house free of odors.  Get plenty of fresh air. SEEK MEDICAL CARE IF:   Your home remedies are not working, and you need medicine.  You feel dizzy or lightheaded.  You are losing weight. SEEK IMMEDIATE MEDICAL CARE IF:   You have persistent and uncontrolled nausea and vomiting.  You pass out (faint). MAKE SURE YOU:  Understand these instructions.  Will watch your condition.  Will get help right away if you are not doing well or get worse. Document Released: 07/21/2006 Document  Revised: 06/04/2013 Document Reviewed: 11/14/2012 ExitCare Patient Information 2015 [employer]. This information is not intended to replace advice given to you by your health care provider. Make sure you discuss any questions you have with your health care provider.  

## 2014-10-08 ENCOUNTER — Encounter (HOSPITAL_COMMUNITY): Payer: Self-pay | Admitting: *Deleted

## 2014-10-08 ENCOUNTER — Inpatient Hospital Stay (HOSPITAL_COMMUNITY)
Admission: AD | Admit: 2014-10-08 | Discharge: 2014-10-08 | Disposition: A | Discharge status: Home / Self Care | Payer: No Typology Code available for payment source | Source: Ambulatory Visit | Attending: Obstetrics and Gynecology | Admitting: Obstetrics and Gynecology

## 2014-10-08 DIAGNOSIS — O9989 Other specified diseases and conditions complicating pregnancy, childbirth and the puerperium: Secondary | ICD-10-CM | POA: Diagnosis not present

## 2014-10-08 DIAGNOSIS — Z3A13 13 weeks gestation of pregnancy: Secondary | ICD-10-CM | POA: Diagnosis not present

## 2014-10-08 DIAGNOSIS — R519 Headache, unspecified: Secondary | ICD-10-CM

## 2014-10-08 DIAGNOSIS — R51 Headache: Secondary | ICD-10-CM | POA: Diagnosis present

## 2014-10-08 LAB — URINALYSIS, ROUTINE W REFLEX MICROSCOPIC
BILIRUBIN URINE: NEGATIVE
Glucose, UA: NEGATIVE mg/dL
HGB URINE DIPSTICK: NEGATIVE
KETONES UR: NEGATIVE mg/dL
NITRITE: POSITIVE — AB
PH: 5.5 (ref 5.0–8.0)
Protein, ur: NEGATIVE mg/dL
UROBILINOGEN UA: 0.2 mg/dL (ref 0.0–1.0)

## 2014-10-08 LAB — URINE MICROSCOPIC-ADD ON

## 2014-10-08 NOTE — MAU Note (Signed)
Pt presents to MAU with complaints of a headache for 4 days. Pt also reports that she has not been evaluated by a physician with this pregnancy and wants to check and make sure everything is ok with the pregnancy. Denies any vaginal bleeding or discharge

## 2014-10-08 NOTE — MAU Provider Note (Signed)
History     CSN: [id number]  Arrival date and time: 10/08/14 1515   First Provider Initiated Contact with Patient 10/08/14 1619      Chief Complaint  Patient presents with  . Headache   HPI Amos Gaber 24 y.o. G1P0  presents to MAU complaining of headache.  This is day 4 of HA.  It started severe on Sunday and today it is mild.  It is throbbing in bilat temples and infraorbital.  No change with movement.  No photophobia or phonophobia.  No nausea or vomiting.  It is worse with heat.  She has used zyrtec with minimal improvement. Tylenol helped her sleep but unsure if headache was affected.  She does have a history of headache prior to pregnancy.  She denies vaginal bleeding or abdominal pain.  She has plans for [facility] in May.   OB History    Gravida Para Term Preterm AB TAB SAB Ectopic Multiple Living   1               Past Medical History  Diagnosis Date  . Chlamydia 2013    Past Surgical History  Procedure Laterality Date  . No past surgeries      Family History  Problem Relation Age of Onset  . Hypertension Mother   . Hypertension Father   . Diabetes Maternal Aunt   . Hypertension Paternal Grandmother     History  Substance Use Topics  . Smoking status: Former Smoker -- 0.50 packs/day for 1 years    Types: Cigarettes    Quit date: 08/03/2014  . Smokeless tobacco: Never Used  . Alcohol Use: No     Comment: not since pregnancy    Allergies:  Allergies  Allergen Reactions  . Amoxicillin Hives  . Penicillins Hives    Prescriptions prior to admission  Medication Sig Dispense Refill Last Dose  . acetaminophen (TYLENOL) 500 MG tablet Take 1,000 mg by mouth every 6 (six) hours as needed for headache.    10/07/2014 at Unknown time  . cetirizine (ZYRTEC) 10 MG tablet Take 10 mg by mouth daily as needed for allergies.    10/08/2014 at Unknown time  . Prenat w/o A Vit-FeFum-FePo-FA (CONCEPT OB) 130-92.4-1 MG CAPS Take 1 tablet by mouth daily. 30 capsule 12  10/08/2014 at Unknown time    ROS Pertinent ROS in HPI.  All other systems are negative.   Physical Exam   Last menstrual period 06/11/2014.  Physical Exam  Constitutional: She is oriented to person, place, and time. She appears well-developed and well-nourished.  HENT:  Head: Normocephalic and atraumatic.  Eyes: EOM are normal.  Neck: Normal range of motion.  Cardiovascular: Normal rate, regular rhythm and normal heart sounds.   Respiratory: Effort normal and breath sounds normal. No respiratory distress.  GI: Soft. Bowel sounds are normal. She exhibits no distension. There is no tenderness. There is no rebound and no guarding.  Musculoskeletal: Normal range of motion.  Neurological: She is oriented to person, place, and time.  Skin: Skin is warm and dry.  Psychiatric: She has a normal mood and affect.   Results for orders placed or performed during the hospital encounter of 10/08/14 (from the past 24 hour(s))  Urinalysis, Routine w reflex microscopic     Status: Abnormal   Collection Time: 10/08/14  3:20 PM  Result Value Ref Range   Color, Urine YELLOW YELLOW   APPearance HAZY (A) CLEAR   Specific Gravity, Urine >1.030 (H) 1.005 - 1.030  pH 5.5 5.0 - 8.0   Glucose, UA NEGATIVE NEGATIVE mg/dL   Hgb urine dipstick NEGATIVE NEGATIVE   Bilirubin Urine NEGATIVE NEGATIVE   Ketones, ur NEGATIVE NEGATIVE mg/dL   Protein, ur NEGATIVE NEGATIVE mg/dL   Urobilinogen, UA 0.2 0.0 - 1.0 mg/dL   Nitrite POSITIVE (A) NEGATIVE   Leukocytes, UA TRACE (A) NEGATIVE  Urine microscopic-add on     Status: Abnormal   Collection Time: 10/08/14  3:20 PM  Result Value Ref Range   Squamous Epithelial / LPF MANY (A) RARE   WBC, UA 0-2 <3 WBC/hpf   Bacteria, UA MANY (A) RARE   Urine-Other MUCOUS PRESENT     MAU Course  Procedures  MDM Pt notes symptoms have improved prior to arrival in MAU.  Simply wants to know safe OTC treatments for futher reference.    Assessment and Plan   A:  1.  Headache, unspecified headache type    P:  Discharge to home Urine culture pending Tylenol safe in pregnancy PRN HA Encourage good po hydration Okay to use Zyrtec in pregnancy PRN allergies. [facility]  Patient may return to MAU as needed or if her condition were to change or worsen   Bertram Denvereague Clark, Karen E 10/08/2014, 4:22 PM

## 2014-10-08 NOTE — Discharge Instructions (Signed)

## 2014-10-11 LAB — CULTURE, OB URINE: Colony Count: >=100000

## 2014-10-12 ENCOUNTER — Other Ambulatory Visit: Payer: Self-pay | Admitting: Nurse Practitioner

## 2014-10-12 MED ORDER — SULFAMETHOXAZOLE-TRIMETHOPRIM 800-160 MG PO TABS: 1.0000 | ORAL_TABLET | Freq: Two times a day (BID) | ORAL | Status: DC

## 2014-10-12 NOTE — Progress Notes (Signed)
Urine culture reviewed.  UTI in pregnancy.  Septra DS one po bid x 7 days eprescribed to client's pharmacy.  Called and spoke with client.  Relayed this info and client reports she will pick up the medication.  Is not having symptoms of UTI.  Advised to increase PO fluids.  Nolene Bernheimerri Burleson, NP

## 2014-10-30 ENCOUNTER — Other Ambulatory Visit (HOSPITAL_COMMUNITY): Payer: Self-pay | Admitting: Nurse Practitioner

## 2014-10-30 DIAGNOSIS — Z3689 Encounter for other specified antenatal screening: Secondary | ICD-10-CM

## 2014-10-30 LAB — OB RESULTS CONSOLE HIV ANTIBODY (ROUTINE TESTING): HIV: NONREACTIVE

## 2014-10-30 LAB — OB RESULTS CONSOLE ANTIBODY SCREEN: Antibody Screen: NEGATIVE

## 2014-10-30 LAB — OB RESULTS CONSOLE ABO/RH: RH Type: POSITIVE

## 2014-10-30 LAB — OB RESULTS CONSOLE RPR: RPR: NONREACTIVE

## 2014-10-30 LAB — OB RESULTS CONSOLE GC/CHLAMYDIA
Chlamydia: NEGATIVE
GC PROBE AMP, GENITAL: NEGATIVE

## 2014-10-30 LAB — OB RESULTS CONSOLE HEPATITIS B SURFACE ANTIGEN: Hepatitis B Surface Ag: NEGATIVE

## 2014-10-30 LAB — OB RESULTS CONSOLE RUBELLA ANTIBODY, IGM: Rubella: NON-IMMUNE/NOT IMMUNE

## 2014-11-12 ENCOUNTER — Ambulatory Visit (HOSPITAL_COMMUNITY)
Admission: RE | Admit: 2014-11-12 | Discharge: 2014-11-12 | Disposition: A | Discharge status: Home / Self Care | Payer: No Typology Code available for payment source | Source: Ambulatory Visit | Attending: Nurse Practitioner | Admitting: Nurse Practitioner

## 2014-11-12 DIAGNOSIS — Z36 Encounter for antenatal screening of mother: Secondary | ICD-10-CM | POA: Diagnosis not present

## 2014-11-12 DIAGNOSIS — Z3689 Encounter for other specified antenatal screening: Secondary | ICD-10-CM | POA: Insufficient documentation

## 2014-11-12 DIAGNOSIS — Z3A18 18 weeks gestation of pregnancy: Secondary | ICD-10-CM | POA: Diagnosis not present

## 2015-01-18 ENCOUNTER — Inpatient Hospital Stay (HOSPITAL_COMMUNITY)
Admission: AD | Admit: 2015-01-18 | Discharge: 2015-01-18 | Disposition: A | Discharge status: Home / Self Care | Payer: No Typology Code available for payment source | Source: Ambulatory Visit | Attending: Obstetrics & Gynecology | Admitting: Obstetrics & Gynecology

## 2015-01-18 ENCOUNTER — Inpatient Hospital Stay (HOSPITAL_COMMUNITY): Payer: No Typology Code available for payment source

## 2015-01-18 ENCOUNTER — Encounter (HOSPITAL_COMMUNITY): Payer: Self-pay | Admitting: *Deleted

## 2015-01-18 DIAGNOSIS — Z3A27 27 weeks gestation of pregnancy: Secondary | ICD-10-CM | POA: Diagnosis not present

## 2015-01-18 DIAGNOSIS — Z87891 Personal history of nicotine dependence: Secondary | ICD-10-CM | POA: Insufficient documentation

## 2015-01-18 DIAGNOSIS — N898 Other specified noninflammatory disorders of vagina: Secondary | ICD-10-CM | POA: Insufficient documentation

## 2015-01-18 DIAGNOSIS — O4690 Antepartum hemorrhage, unspecified, unspecified trimester: Secondary | ICD-10-CM

## 2015-01-18 DIAGNOSIS — O9989 Other specified diseases and conditions complicating pregnancy, childbirth and the puerperium: Secondary | ICD-10-CM | POA: Insufficient documentation

## 2015-01-18 DIAGNOSIS — O4692 Antepartum hemorrhage, unspecified, second trimester: Secondary | ICD-10-CM | POA: Diagnosis present

## 2015-01-18 LAB — URINALYSIS, ROUTINE W REFLEX MICROSCOPIC
Bilirubin Urine: NEGATIVE
Glucose, UA: NEGATIVE mg/dL
Ketones, ur: >80 mg/dL — AB
Nitrite: NEGATIVE
PROTEIN: 30 mg/dL — AB
Specific Gravity, Urine: >1.03 — ABNORMAL HIGH (ref 1.005–1.030)
UROBILINOGEN UA: 0.2 mg/dL (ref 0.0–1.0)
pH: 6 (ref 5.0–8.0)

## 2015-01-18 LAB — WET PREP, GENITAL
Clue Cells Wet Prep HPF POC: NONE SEEN
TRICH WET PREP: NONE SEEN
YEAST WET PREP: NONE SEEN

## 2015-01-18 LAB — URINE MICROSCOPIC-ADD ON

## 2015-01-18 NOTE — Progress Notes (Signed)
Dr Freida Busman notified of BPP 8/8. Will cont to monitor on EFM and Dr Freida Busman will see pt

## 2015-01-18 NOTE — Progress Notes (Signed)
Dr Freida Busman in earlier to discuss test results and d/c plan. Written and verbal d/c instructions given and understanding voiced

## 2015-01-18 NOTE — MAU Note (Signed)
0127 FHR ? Down to 100s. Pt turned to R and then L side. ? Maternal HR. FHR 145 when clearly found with transducer after turning. Pulse ox applied

## 2015-01-18 NOTE — Progress Notes (Signed)
Dr Allen in to see pt.

## 2015-01-18 NOTE — MAU Note (Signed)
Bright spotting few times today when went to [facility] and wiped. Feels like baby sitting on bladder.

## 2015-01-18 NOTE — Discharge Instructions (Signed)
°  Vaginal Bleeding During Pregnancy, Third Trimester °A small amount of bleeding (spotting) from the vagina is relatively common in pregnancy. Various things can cause bleeding or spotting in pregnancy. Sometimes the bleeding is normal and is not a problem. However, bleeding during the third trimester can also be a sign of something serious for the mother and the baby. Be sure to tell your health care provider about any vaginal bleeding right away.  °Some possible causes of vaginal bleeding during the third trimester include:  °· The placenta may be partially or completely covering the opening to the cervix (placenta previa).   °· The placenta may have separated from the uterus (abruption of the placenta).   °· There may be an infection or growth on the cervix.   °· You may be starting labor, called discharging of the mucus plug.   °· The placenta may grow into the muscle layer of the uterus (placenta accreta).   °HOME CARE INSTRUCTIONS  °Watch your condition for any changes. The following actions may help to lessen any discomfort you are feeling:  °· Follow your health care provider's instructions for limiting your activity. If your health care provider orders bed rest, you may need to stay in bed and only get up to use the bathroom. However, your health care provider may allow you to continue light activity. °· If needed, make plans for someone to help with your regular activities and responsibilities while you are on bed rest. °· Keep track of the number of pads you use each day, how often you change pads, and how soaked (saturated) they are. Write this down. °· Do not use tampons. Do not douche. °· Do not have sexual intercourse or orgasms until approved by your health care provider. °· Follow your health care provider's advice about lifting, driving, and physical activities. °· If you pass any tissue from your vagina, save the tissue so you can show it to your health care provider.   °· Only take  over-the-counter or prescription medicines as directed by your health care provider. °· Do not take aspirin because it can make you bleed.   °· Keep all follow-up appointments as directed by your health care provider. °SEEK MEDICAL CARE IF: °· You have any vaginal bleeding during any part of your pregnancy. °· You have cramps or labor pains. °· You have a fever, not controlled by medicine. °SEEK IMMEDIATE MEDICAL CARE IF:  °· You have severe cramps or pain in your back or belly (abdomen). °· You have chills. °· You have a gush of fluid from the vagina. °· You pass large clots or tissue from your vagina. °· Your bleeding increases. °· You feel light-headed or weak. °· You pass out. °· You feel less movement or no movement of the baby.   °MAKE SURE YOU: °· Understand these instructions. °· Will watch your condition. °· Will get help right away if you are not doing well or get worse. °Document Released: 08/20/2002 Document Revised: 06/04/2013 Document Reviewed: 02/04/2013 °ExitCare® Patient Information ©2015 [employer]. This information is not intended to replace advice given to you by your health care provider. Make sure you discuss any questions you have with your health care provider. ° ° °

## 2015-01-18 NOTE — Progress Notes (Signed)
Dr Wouk/Dr Freida Busman

## 2015-01-18 NOTE — Progress Notes (Signed)
To us via wc.

## 2015-01-18 NOTE — MAU Provider Note (Signed)
History     CSN: [id number]  Arrival date and time: 01/18/15 0865   First Provider Initiated Contact with Patient 01/18/15 0136      Chief Complaint  Patient presents with  . Vaginal Bleeding   HPI Comments: Pt is a 24 yo f G1P0 at [redacted]w[redacted]d who presents w/ complaints of pink vag d/c x2 today. She states that she noted the d/c after using the bathroom and that she did not notice any interval d/c between voids. She is also feeling some pressure in her pelvis which has become more prominent over the last few weeks. She is currently comfortable an not feeling any contractions. She states that during hte pregnancy, she has had BV, yeast infx, and UTI. She denies dysuria/freq/hematuria.  Vaginal Bleeding Pertinent negatives include no constipation, diarrhea, dysuria, frequency, hematuria, nausea, urgency or vomiting.    OB History    Gravida Para Term Preterm AB TAB SAB Ectopic Multiple Living   1               Past Medical History  Diagnosis Date  . Chlamydia 2013    Past Surgical History  Procedure Laterality Date  . No past surgeries      Family History  Problem Relation Age of Onset  . Hypertension Mother   . Hypertension Father   . Diabetes Maternal Aunt   . Hypertension Paternal Grandmother     History  Substance Use Topics  . Smoking status: Former Smoker -- 0.50 packs/day for 1 years    Types: Cigarettes    Quit date: 08/03/2014  . Smokeless tobacco: Never Used  . Alcohol Use: No     Comment: not since pregnancy    Allergies:  Allergies  Allergen Reactions  . Amoxicillin Hives  . Penicillins Hives    Prescriptions prior to admission  Medication Sig Dispense Refill Last Dose  . Prenat w/o A Vit-FeFum-FePo-FA (CONCEPT OB) 130-92.4-1 MG CAPS Take 1 tablet by mouth daily. 30 capsule 12 Past Week at Unknown time  . acetaminophen (TYLENOL) 500 MG tablet Take 1,000 mg by mouth every 6 (six) hours as needed for headache.    More than a month at Unknown time  .  cetirizine (ZYRTEC) 10 MG tablet Take 10 mg by mouth daily as needed for allergies.    10/08/2014 at Unknown time  . sulfamethoxazole-trimethoprim (BACTRIM DS) 800-160 MG per tablet Take 1 tablet by mouth 2 (two) times daily. 14 tablet 0     Review of Systems  Respiratory: Negative for shortness of breath.   Cardiovascular: Negative for leg swelling.  Gastrointestinal: Negative for nausea, vomiting, diarrhea and constipation.  Genitourinary: Positive for vaginal bleeding. Negative for dysuria, urgency, frequency and hematuria.  Neurological: Negative for weakness.   Physical Exam   Blood pressure 125/67, pulse 87, temperature 98.5 F (36.9 C), resp. rate 18, height 5\' 4"  (1.626 m), weight 72.303 kg (159 lb 6.4 oz), last menstrual period 06/11/2014.  Physical Exam  Constitutional: She is oriented to person, place, and time. She appears well-developed and well-nourished. No distress.  HENT:  Head: Normocephalic and atraumatic.  Eyes: Conjunctivae and EOM are normal.  Neck: Normal range of motion. No tracheal deviation present.  Respiratory: Effort normal. No respiratory distress.  GI: She exhibits no distension.  Genitourinary:  Cervix visualized on sterile spec exam and found to have a small area of erythema at the transition zone in the 4 o'clock position; thick mucous seen within the os and vagina  Musculoskeletal: Normal  range of motion.  Neurological: She is alert and oriented to person, place, and time.  Skin: Skin is warm and dry. She is not diaphoretic.  Psychiatric: She has a normal mood and affect. Her behavior is normal. Judgment and thought content normal.  FHR 150s though 4 min period in 110s, mod var, +acels, -decels, no contx  MAU Course  Procedures  MDM Spec exam  Assessment and Plan  Pt is a 24 yo f G1P0 at [redacted]w[redacted]d who presents w/ pink vag d/c x2 today. She is otherwise w/o complaint. She has hx of BV, UTI, and yeast infx during this preg. Placenta is fundal.  Vag  spotting #DDX: abruption, bloody show, cervical friability, vag infx, UTI #will obtain UA, wet prep, GC #UA appears concentrated, but w/o nitrites; will send for cultx #wet prep w/ WBCs only #given erythematous area seen on cervical exam, pt's spotting can likely be attributed to cervical friability #counseled on labor precautions and reasons to return  FHR in 110s #will order BPP- 8/8 #strip w/ only one additional abnormality while on the monitor for about 2 hrs, this appears to have been from poor tracing as FHR returned to previous baseline w/ repositioning #will d/c home w/ f/u at [facility] next week  Veronica Jones 01/18/2015, 1:48 AM    OB FELLOW MAU DISCHARGE ATTESTATION  I have seen and examined this patient; I agree with above documentation in the resident's note.   Veronica Jones is a 24 y.o. G1P0 reporting light vaginal spotting. No abdominal pain or dysuria. +FM, denies LOF, VB, contractions.  PE: BP 125/67 mmHg  Pulse 87  Temp(Src) 97.6 F (36.4 C)  Resp 18  Ht  (1.626 m)  Wt 159 lb 6.4 oz (72.303 kg)  BMI 27.35 kg/m2  LMP 06/11/2014 Gen: calm comfortable, NAD Resp: normal effort, no distress Abd: gravid Cervix: friable, no blood in vagina or coming from cervix No CMT Cervix 1/long/high  ROS, labs, PMH reviewed NST reactive with one late decel  Plan: Vaginal spotting: pain to suggest abruption; no blood in vagina. Does show cervical friability - wet prep negative, gc/chlamydia pending. No signs PID. No symptoms of uti but urinalysis equivocal and so sent for culture. One long late decel on extended fetal monitoring - BPP ordered, 8/8 per report (final read not yet posted), no abnormalities. - ob f/u next week - strict bleeding, pprom, and ptl return precautions  Silvano Bilis, MD 6:58 AM

## 2015-01-18 NOTE — Progress Notes (Signed)
Dr Freida Busman notified of pt's admission and status. Will come see pt. Aware of complaint of spotting, hx uti, some u/i.

## 2015-01-18 NOTE — MAU Note (Signed)
0402 pt had turned to R side and FHR not recording. 0406 pt turned back to semifowlers and FHR found at 128. Dr Freida Busman in to see pt and ok to d/c efm for d/c home.

## 2015-01-19 LAB — GC/CHLAMYDIA PROBE AMP (~~LOC~~) NOT AT ARMC
Chlamydia: NEGATIVE
Neisseria Gonorrhea: NEGATIVE

## 2015-03-24 LAB — OB RESULTS CONSOLE GBS: GBS: POSITIVE

## 2015-03-30 LAB — OB RESULTS CONSOLE GBS: GBS: NEGATIVE

## 2015-04-16 ENCOUNTER — Other Ambulatory Visit (HOSPITAL_COMMUNITY): Payer: Self-pay | Admitting: Nurse Practitioner

## 2015-04-16 ENCOUNTER — Encounter (HOSPITAL_COMMUNITY): Payer: Self-pay

## 2015-04-16 ENCOUNTER — Ambulatory Visit (HOSPITAL_COMMUNITY)
Admission: RE | Admit: 2015-04-16 | Discharge: 2015-04-16 | Disposition: A | Discharge status: Home / Self Care | Payer: No Typology Code available for payment source | Source: Ambulatory Visit | Attending: Obstetrics & Gynecology | Admitting: Obstetrics & Gynecology

## 2015-04-16 ENCOUNTER — Encounter (HOSPITAL_COMMUNITY): Payer: Self-pay | Admitting: *Deleted

## 2015-04-16 ENCOUNTER — Telehealth (HOSPITAL_COMMUNITY): Payer: Self-pay | Admitting: *Deleted

## 2015-04-16 ENCOUNTER — Inpatient Hospital Stay (HOSPITAL_COMMUNITY)
Admission: AD | Admit: 2015-04-16 | Discharge: 2015-04-20 | DRG: 774 | Disposition: A | Discharge status: Home / Self Care | Payer: No Typology Code available for payment source | Source: Ambulatory Visit | Attending: Obstetrics & Gynecology | Admitting: Obstetrics & Gynecology

## 2015-04-16 DIAGNOSIS — O152 Eclampsia in the puerperium: Secondary | ICD-10-CM | POA: Diagnosis not present

## 2015-04-16 DIAGNOSIS — Z3A40 40 weeks gestation of pregnancy: Secondary | ICD-10-CM | POA: Diagnosis not present

## 2015-04-16 DIAGNOSIS — O9089 Other complications of the puerperium, not elsewhere classified: Secondary | ICD-10-CM | POA: Diagnosis not present

## 2015-04-16 DIAGNOSIS — O480 Post-term pregnancy: Secondary | ICD-10-CM

## 2015-04-16 DIAGNOSIS — O403XX0 Polyhydramnios, third trimester, not applicable or unspecified: Secondary | ICD-10-CM | POA: Diagnosis present

## 2015-04-16 DIAGNOSIS — R569 Unspecified convulsions: Secondary | ICD-10-CM | POA: Diagnosis not present

## 2015-04-16 DIAGNOSIS — Z23 Encounter for immunization: Secondary | ICD-10-CM

## 2015-04-16 DIAGNOSIS — Z3403 Encounter for supervision of normal first pregnancy, third trimester: Secondary | ICD-10-CM | POA: Diagnosis present

## 2015-04-16 LAB — CBC
HEMATOCRIT: 30.9 % — AB (ref 36.0–46.0)
Hemoglobin: 10.5 g/dL — ABNORMAL LOW (ref 12.0–15.0)
MCH: 29.4 pg (ref 26.0–34.0)
MCHC: 34 g/dL (ref 30.0–36.0)
MCV: 86.6 fL (ref 78.0–100.0)
PLATELETS: 121 10*3/uL — AB (ref 150–400)
RBC: 3.57 MIL/uL — AB (ref 3.87–5.11)
RDW: 15.4 % (ref 11.5–15.5)
WBC: 8 10*3/uL (ref 4.0–10.5)

## 2015-04-16 LAB — TYPE AND SCREEN
ABO/RH(D): O POS
ANTIBODY SCREEN: NEGATIVE

## 2015-04-16 LAB — ABO/RH: ABO/RH(D): O POS

## 2015-04-16 MED ORDER — FENTANYL CITRATE (PF) 100 MCG/2ML IJ SOLN
50.0000 ug | INTRAMUSCULAR | Status: DC | PRN
  Administered 2015-04-16 (×2): 50 ug via INTRAVENOUS
  Filled 2015-04-16 (×2): qty 2

## 2015-04-16 MED ORDER — LIDOCAINE HCL (PF) 1 % IJ SOLN
30.0000 mL | INTRAMUSCULAR | Status: DC | PRN
  Filled 2015-04-16: qty 30

## 2015-04-16 MED ORDER — LACTATED RINGERS IV SOLN
500.0000 mL | INTRAVENOUS | Status: DC | PRN
Start: 2015-04-16 — End: 2015-04-17
  Administered 2015-04-16 (×2): 500 mL via INTRAVENOUS

## 2015-04-16 MED ORDER — CEFAZOLIN SODIUM-DEXTROSE 2-3 GM-% IV SOLR
2.0000 g | Freq: Once | INTRAVENOUS | Status: AC
  Administered 2015-04-16: 2 g via INTRAVENOUS
  Filled 2015-04-16: qty 50

## 2015-04-16 MED ORDER — OXYCODONE-ACETAMINOPHEN 5-325 MG PO TABS: 1.0000 | ORAL_TABLET | ORAL | Status: DC | PRN

## 2015-04-16 MED ORDER — OXYTOCIN 40 UNITS IN LACTATED RINGERS INFUSION - SIMPLE MED
1.0000 m[IU]/min | INTRAVENOUS | Status: DC
  Administered 2015-04-16: 2 m[IU]/min via INTRAVENOUS
  Filled 2015-04-16: qty 1000

## 2015-04-16 MED ORDER — ACETAMINOPHEN 325 MG PO TABS: 650.0000 mg | ORAL_TABLET | ORAL | Status: DC | PRN

## 2015-04-16 MED ORDER — OXYTOCIN BOLUS FROM INFUSION
500.0000 mL | INTRAVENOUS | Status: DC
  Administered 2015-04-17: 500 mL via INTRAVENOUS

## 2015-04-16 MED ORDER — ONDANSETRON HCL 4 MG/2ML IJ SOLN
4.0000 mg | Freq: Four times a day (QID) | INTRAMUSCULAR | Status: DC | PRN
  Administered 2015-04-16: 4 mg via INTRAVENOUS
  Filled 2015-04-16: qty 2

## 2015-04-16 MED ORDER — LACTATED RINGERS IV SOLN
INTRAVENOUS | Status: DC
  Administered 2015-04-16: 21:00:00 via INTRAVENOUS
  Administered 2015-04-16: 125 mL/h via INTRAVENOUS

## 2015-04-16 MED ORDER — OXYTOCIN 40 UNITS IN LACTATED RINGERS INFUSION - SIMPLE MED: 62.5000 mL/h | INTRAVENOUS | Status: DC

## 2015-04-16 MED ORDER — OXYCODONE-ACETAMINOPHEN 5-325 MG PO TABS: 2.0000 | ORAL_TABLET | ORAL | Status: DC | PRN

## 2015-04-16 MED ORDER — CEFAZOLIN SODIUM 1-5 GM-% IV SOLN
1.0000 g | Freq: Three times a day (TID) | INTRAVENOUS | Status: DC
  Filled 2015-04-16: qty 50

## 2015-04-16 MED ORDER — FLEET ENEMA 7-19 GM/118ML RE ENEM: 1.0000 | ENEMA | RECTAL | Status: DC | PRN

## 2015-04-16 MED ORDER — CITRIC ACID-SODIUM CITRATE 334-500 MG/5ML PO SOLN: 30.0000 mL | ORAL | Status: DC | PRN

## 2015-04-16 MED ORDER — TERBUTALINE SULFATE 1 MG/ML IJ SOLN
0.2500 mg | Freq: Once | INTRAMUSCULAR | Status: DC | PRN
  Filled 2015-04-16: qty 1

## 2015-04-16 NOTE — Telephone Encounter (Signed)
Preadmission screen  

## 2015-04-16 NOTE — H&P (Signed)
LABOR AND DELIVERY ADMISSION HISTORY AND PHYSICAL NOTE  Veronica Jones is a 24 y.o. female G1P0 with IUP at [redacted]w[redacted]d. She has had an uncomplicated pregnancy course and [facility]. She was referred today for a BPP for post-dates per her report. At that time she was found to have polyhydramnios and was subsequently referred here for induction. She reports +FMs, no LOF, no VB, no blurry vision, no headaches, no RUQ or epigastric pain. See below for complete ROS.  Prenatal History/Complications: Clinic  Health Dept Prenatal Labs  Dating    Blood type: O/POS (10/30/14)  Genetic Screen  Quad: Normal  Antibody: NEG (10/30/14)  Anatomic US  WNL  Rubella: NON-IMMUNE (10/30/14)  GTT  1 hr: 75  RPR: NEG (01/21/15)  Flu vaccine  02/25/15  HBsAg: NEG (10/30/14)  TDaP vaccine                                           HIV: NON-REACTIVE (01/21/15)  GBS  POS (03/24/15)  GBS: Neg (03/24/15)  Contraception Plan  Depo   Pap: LSIL +HPV (10/30/14)  Baby Feeding Plan  Breast  GC/CT: NEG/NEG (03/24/15)  Circumcision?  N/A (girl)   Pediatrician  Undecided   Support Person  Boyfriend & FOB    Past Medical History: Past Medical History  Diagnosis Date  . Chlamydia 2013   Past Surgical History: Past Surgical History  Procedure Laterality Date  . No past surgeries     Obstetrical History: OB History    Gravida Para Term Preterm AB TAB SAB Ectopic Multiple Living   1              Social History: Social History   Social History  . Marital Status: Single    Spouse Name: N/A  . Number of Children: 0  . Years of Education: N/A   Occupational History  . 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 Smoking status: Former Smoker -- 0.50 packs/day for 1 years    Types: Cigarettes    Quit date: 08/03/2014  . Smokeless tobacco: Never Used  . Alcohol Use: No     Comment: not since pregnancy  . Drug Use: Yes    Special: Marijuana     Comment: 1 week asgo  .  Sexual Activity: Not on file   Other Topics Concern  . Not on file   Social History Narrative   Veronica Jones lives with her parents and two brothers in a house, all of which are smokers. She reports a good relationship with her family members and feels it is a safe place. She plans to continue living there with her infant post-partum.     Family History: Family History  Problem Relation Age of Onset  . Hypertension Mother   . Hypertension Father   . Diabetes Maternal Aunt   . Hypertension Paternal Grandmother   . Cancer Paternal Grandmother     breast  . Diabetes Paternal Grandmother    Allergies: Allergies  Allergen Reactions  . Amoxicillin Hives  . Penicillins Hives    Has patient had a PCN reaction causing immediate rash, facial/tongue/throat swelling, SOB or lightheadedness with hypotension: No Has patient had a PCN reaction causing severe rash involving mucus membranes or skin necrosis: No Has patient had a PCN reaction that required hospitalization  No Has patient had a PCN reaction occurring within the last 10 years: No If all of the above answers are "NO", then may proceed with Cephalosporin use.     Prescriptions prior to admission  Medication Sig Dispense Refill Last Dose  . Prenat w/o A Vit-FeFum-FePo-FA (CONCEPT OB) 130-92.4-1 MG CAPS Take 1 tablet by mouth daily. 30 capsule 12 Past Week at Unknown time   Review of Systems  Review of Systems  Constitutional: Negative for fever and chills.  HENT: Negative for congestion and sore throat.   Eyes: Negative for blurred vision and double vision.  Respiratory: Negative for cough and shortness of breath.   Cardiovascular: Negative for chest pain.  Gastrointestinal: Positive for heartburn. Negative for nausea, vomiting, abdominal pain, diarrhea and constipation.  Genitourinary: Negative for dysuria and hematuria.  Skin: Negative for rash.  Neurological: Negative for headaches.  Psychiatric/Behavioral: Negative for  depression.    Filed Vitals:   04/16/15 1650 04/16/15 1655 04/16/15 1700 04/16/15 1705  BP:      Pulse: 90 98 90 92  SpO2: 97% 97% 98% 98%   GEN: alert, comfortable-appearing woman resting in bed. FOB at bedside. PULM: CTAB on frontal field exam CV: RRR, S1 and S2 heard, no M/R/G appreciated ABD: Gravid. Abdomen NTTP. No epigastric of RUQ pain. No guarding. Fetus felt to be vertex by Leopold's. GU: Cervix 3.2 / 80% / -3 as examined by [place] CNM EXTR: No LE edema or calf tenderness.  FHT: HR 165 / moderate variability / no accels / no decels Toco: quiet  Patient Active Problem List   Diagnosis Date Noted  . Polyhydramnios in third trimester 04/16/2015  . [redacted] weeks gestation of pregnancy   . Screening, antenatal, for fetal anatomic survey    Assessment: Veronica Jones is a 24 y.o. primigravinda at 5438w1d here for IOL for polyhydramnios found today on ultrasound. Her pregnancy has otherwise been uncomplicated. -- Labor: Induction of labor due to polyhydramnios,  progressing well on pitocin  -- Category II strip due to mild fetal tachycardia but otherwise reactive -- Augmentation with Pitocin as appropriate -- Patient may have epidural at her request -- GBS: neg -- Feeding: Breast -- Contraception: Hormonal Contraception: Injection, Rings and Patches Diet: Clears PPx: None for now, low risk & ambulating Code Status: FULL Dispo: Admit to L&D for routine inpatient care of laboring patient.  Gerri Sporeaitlin Sullivan, MD 04/16/2015, 5:06 PM  I was present for the exam and agree with above.  [place]ia Bocephus Cali, CNM 04/16/2015 5:56 PM

## 2015-04-16 NOTE — Progress Notes (Signed)
   Veronica Jones is a 24 y.o. G1P0 at 5231w1d  admitted for induction of labor due to Hydramnios.  Subjective: Feels rectal pressure. Trying to deliver w/o epidural  Objective: Filed Vitals:   04/16/15 2030 04/16/15 2053 04/16/15 2156 04/16/15 2300  BP: 127/84 132/67 137/85 135/67  Pulse: 72 69 58 60  Temp:   97.5 F (36.4 C)   TempSrc:   Oral   Resp:  18 20 18   Height:      Weight:      SpO2:          FHT:  FHR: 145 bpm, variability: moderate,  accelerations:  Present,  decelerations:  Present had several prolonged variable decels/early. Pit was cut off for an hour.  FHR has been very reactive w/o decels for >1 hour now. IUPC placed UC:   regular, every 1-2 minutes SVE:   Dilation: 6.5 Effacement (%): 90 Station: -1, 0 Exam by:: e. poore, rn My exam: 7-8/90/-1 Had SROM with clear fluid @ 2100 Pit at 2 mu/min  Labs: Lab Results  Component Value Date   WBC 8.0 04/16/2015   HGB 10.5* 04/16/2015   HCT 30.9* 04/16/2015   MCV 86.6 04/16/2015   PLT 121* 04/16/2015    Assessment / Plan: Induction of labor due to polyhydramnios,  progressing well on pitocin  Labor: Progressing normally Fetal Wellbeing:  Category I and Category II Pain Control:  Labor support without medications Anticipated MOD:  NSVD  CRESENZO-DISHMAN,Veronica Jones 04/16/2015, 11:11 PM

## 2015-04-17 ENCOUNTER — Encounter (HOSPITAL_COMMUNITY): Payer: Self-pay

## 2015-04-17 DIAGNOSIS — O9089 Other complications of the puerperium, not elsewhere classified: Secondary | ICD-10-CM

## 2015-04-17 DIAGNOSIS — O403XX0 Polyhydramnios, third trimester, not applicable or unspecified: Secondary | ICD-10-CM

## 2015-04-17 DIAGNOSIS — R569 Unspecified convulsions: Secondary | ICD-10-CM

## 2015-04-17 DIAGNOSIS — O152 Eclampsia in the puerperium: Secondary | ICD-10-CM

## 2015-04-17 DIAGNOSIS — O480 Post-term pregnancy: Secondary | ICD-10-CM

## 2015-04-17 DIAGNOSIS — Z3A40 40 weeks gestation of pregnancy: Secondary | ICD-10-CM

## 2015-04-17 LAB — RPR: RPR: NONREACTIVE

## 2015-04-17 MED ORDER — ACETAMINOPHEN 325 MG PO TABS: 650.0000 mg | ORAL_TABLET | ORAL | Status: DC | PRN

## 2015-04-17 MED ORDER — METHYLERGONOVINE MALEATE 0.2 MG PO TABS: 0.2000 mg | ORAL_TABLET | ORAL | Status: DC | PRN

## 2015-04-17 MED ORDER — OXYCODONE-ACETAMINOPHEN 5-325 MG PO TABS
1.0000 | ORAL_TABLET | ORAL | Status: DC | PRN
  Administered 2015-04-18 – 2015-04-19 (×2): 1 via ORAL
  Filled 2015-04-17 (×2): qty 1

## 2015-04-17 MED ORDER — MEASLES, MUMPS & RUBELLA VAC ~~LOC~~ INJ
0.5000 mL | INJECTION | Freq: Once | SUBCUTANEOUS | Status: AC
  Administered 2015-04-20: 0.5 mL via SUBCUTANEOUS
  Filled 2015-04-17 (×2): qty 0.5

## 2015-04-17 MED ORDER — LANOLIN HYDROUS EX OINT: TOPICAL_OINTMENT | CUTANEOUS | Status: DC | PRN

## 2015-04-17 MED ORDER — OXYCODONE-ACETAMINOPHEN 5-325 MG PO TABS: 2.0000 | ORAL_TABLET | ORAL | Status: DC | PRN

## 2015-04-17 MED ORDER — ONDANSETRON HCL 4 MG/2ML IJ SOLN: 4.0000 mg | INTRAMUSCULAR | Status: DC | PRN

## 2015-04-17 MED ORDER — DIBUCAINE 1 % RE OINT
1.0000 "application " | TOPICAL_OINTMENT | RECTAL | Status: DC | PRN
  Filled 2015-04-17: qty 28

## 2015-04-17 MED ORDER — TETANUS-DIPHTH-ACELL PERTUSSIS 5-2.5-18.5 LF-MCG/0.5 IM SUSP: 0.5000 mL | Freq: Once | INTRAMUSCULAR | Status: DC

## 2015-04-17 MED ORDER — OXYTOCIN 40 UNITS IN LACTATED RINGERS INFUSION - SIMPLE MED: 62.5000 mL/h | INTRAVENOUS | Status: DC | PRN

## 2015-04-17 MED ORDER — METHYLERGONOVINE MALEATE 0.2 MG/ML IJ SOLN: 0.2000 mg | INTRAMUSCULAR | Status: DC | PRN

## 2015-04-17 MED ORDER — BENZOCAINE-MENTHOL 20-0.5 % EX AERO
1.0000 "application " | INHALATION_SPRAY | CUTANEOUS | Status: DC | PRN
  Filled 2015-04-17: qty 56

## 2015-04-17 MED ORDER — ONDANSETRON HCL 4 MG PO TABS: 4.0000 mg | ORAL_TABLET | ORAL | Status: DC | PRN

## 2015-04-17 MED ORDER — DIPHENHYDRAMINE HCL 25 MG PO CAPS: 25.0000 mg | ORAL_CAPSULE | Freq: Four times a day (QID) | ORAL | Status: DC | PRN

## 2015-04-17 MED ORDER — WITCH HAZEL-GLYCERIN EX PADS: 1.0000 "application " | MEDICATED_PAD | CUTANEOUS | Status: DC | PRN

## 2015-04-17 MED ORDER — SENNOSIDES-DOCUSATE SODIUM 8.6-50 MG PO TABS
2.0000 | ORAL_TABLET | ORAL | Status: DC
Start: 2015-04-18 — End: 2015-04-20
  Administered 2015-04-17 – 2015-04-20 (×3): 2 via ORAL
  Filled 2015-04-17 (×4): qty 2

## 2015-04-17 MED ORDER — SIMETHICONE 80 MG PO CHEW
80.0000 mg | CHEWABLE_TABLET | ORAL | Status: DC | PRN
  Filled 2015-04-17: qty 1

## 2015-04-17 MED ORDER — ZOLPIDEM TARTRATE 5 MG PO TABS: 5.0000 mg | ORAL_TABLET | Freq: Every evening | ORAL | Status: DC | PRN

## 2015-04-17 MED ORDER — PNEUMOCOCCAL VAC POLYVALENT 25 MCG/0.5ML IJ INJ
0.5000 mL | INJECTION | INTRAMUSCULAR | Status: DC
  Filled 2015-04-17: qty 0.5

## 2015-04-17 MED ORDER — FERROUS SULFATE 325 (65 FE) MG PO TABS
325.0000 mg | ORAL_TABLET | Freq: Two times a day (BID) | ORAL | Status: DC
  Administered 2015-04-17 – 2015-04-20 (×6): 325 mg via ORAL
  Filled 2015-04-17 (×9): qty 1

## 2015-04-17 MED ORDER — FLEET ENEMA 7-19 GM/118ML RE ENEM
1.0000 | ENEMA | Freq: Every day | RECTAL | Status: DC | PRN
Start: 2015-04-17 — End: 2015-04-20

## 2015-04-17 MED ORDER — PRENATAL MULTIVITAMIN CH
1.0000 | ORAL_TABLET | Freq: Every day | ORAL | Status: DC
  Administered 2015-04-17 – 2015-04-19 (×3): 1 via ORAL
  Filled 2015-04-17 (×4): qty 1

## 2015-04-17 MED ORDER — BISACODYL 10 MG RE SUPP
10.0000 mg | Freq: Every day | RECTAL | Status: DC | PRN
  Filled 2015-04-17: qty 1

## 2015-04-17 MED ORDER — IBUPROFEN 600 MG PO TABS
600.0000 mg | ORAL_TABLET | Freq: Four times a day (QID) | ORAL | Status: DC
  Administered 2015-04-17 – 2015-04-20 (×11): 600 mg via ORAL
  Filled 2015-04-17 (×12): qty 1

## 2015-04-17 NOTE — Clinical Social Work Maternal (Signed)
CLINICAL SOCIAL WORK MATERNAL/CHILD NOTE  Patient Details  Name: Veronica Jones MRN: [id number] Date of Birth: 09/14/1990  Date:  02-21-15  Clinical Social Worker Initiating Note:  Lucita Ferrara MSW, LCSW Date/ Time Initiated:  04/17/15/1315     Child's Name:  Veronica Jones   Legal Guardian:  Veronica Jones and Sha'Rod Botton  Need for Interpreter:  None   Date of Referral:  2015-05-01     Reason for Referral:  Current Substance Use/Substance Use During Pregnancy (marijuana)   Referral Source:  [facility]   Address:  [place], [place] [address]  Phone number:  [phone]   Household Members:  Mother, father, 2 older brothers  Natural Supports (not living in the home):  Spouse/significant other, Immediate Family, Extended Family   Professional Supports: None   Employment: Ship broker, Part-time   Type of Work:   N/A  Education:  Diplomatic Services operational officer Resources:  [employer], Multimedia programmer   Other Resources:  [facility]   Cultural/Religious Considerations Which May Impact Care:  None reported  Strengths:  Ability to meet basic needs , Home prepared for child , Pediatrician chosen    Risk Factors/Current Problems:   1)Substance Use: MOB presents with THC use during the pregnancy to assist with nausea (+UDS for [facility] in May). Infant's UDS is positive for THC and benzodiazepines. MOB reported that she took an unprescribed Xanax one week ago (given by a friend).     Cognitive State:  Able to Concentrate , Alert , Goal Oriented , Linear Thinking    Mood/Affect:  Happy , Animated, Calm , Comfortable    CSW Assessment:  CSW received request for consult due to MOB presenting with a history of THC use during the pregnancy.  FOB was also present in the room, but he was observed to be resting and sleeping during the assessment.  MOB presented as easily engaged and receptive to the visit. She was noted to be in a pleasant mood and displayed a full  range in affect.  MOB eagerly reflected upon and shared her childbirth experience.  She smiled as she reflected upon her personal strength that assisted her to give birth to the infant when she felt that it was not possible.  MOB discussed the intense feelings of happiness she experienced when she finally met and held the infant. She shared that despite numerous visitors from family, friends, and hospital staff members, she feels that she has been able to positively bond and interact with the infant.  She stated that it continues to feel surreal that she is now a mother and gave birth to her. Per MOB, she knows that she will have plenty of time to bond and care for the infant once she is discharged home.  MOB stated that the home is prepared for the infant and that she feels well supported. She shared that she lives with her mother, father, and 2 older brothers.  MOB stated that the FOB lives approximately 15 minutes away and will be providing support when he is not working. 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 She recognized the potential challenges and stressors that she will experience as she copes with multiple responsibilities, but expressed belief that she will be "okay".  MOB described normative range of emotions during the pregnancy, and shared that she did note feelings of depression. She stated that the symptoms were short  in duration because she knew that she needed to maintain positivity and feelings of happiness for the benefit of the infant. MOB expressed feeling hope and motivated because of her daughter.  She expressed confidence in her ability to self-regulate in the future, and recognized that she is not alone. She voiced intention to continue to remind herself that she has a support system behind her if she notes return in symptoms.   MOB openly discussed THC use during the pregnancy.  She stated that she routinely used THC to assist with nausea. She expressed difficulties eating without THC use, and reported last use was approximately one month ago.  MOB verbalized understanding of the hospital drug screen policy, and was informed that the infant's UDS is positive for THC and benzodiazepines.  MOB acknowledged that she does not have a prescription for benzodiazepines, but stated that one week ago she was visiting a friend when she experienced a bad headache.  MOB shared belief that the medication her friend gave her was a Xanax.  She expressed regret for this decision since she normally does not normally take medication that is unknown to her or that is not prescribed.  MOB recognized that she cannot go back and change, and can only move forward. MOB stated that she has no intentions to restart THC postpartum since she knows that smoke is bad for the infant and she needs to focus on the infant's needs.    MOB verbalized understanding that a CPS report will be made. She reported normative feelings secondary to CPS report, and expressed fear that she will be viewed as an unfit mother.  MOB denied any behaviors or indicators that would lead to this definition of her as a mother.  CSW provided education on what to anticipate with CPS involvement, and MOB shared that she felt much "better".    MOB expressed appreciation for the visit, support, and information. She acknowledged ongoing CSW availability, and agreed to contact CSW if needs arise during the admission.   CSW Plan/Description:   1)Patient/Family Education: Perinatal mood disorders, hospital drug screen policy 2)Child Protective Service Report: Report made to [facility] CPS due to infant's +UDS for [facility] and benzodiazepines. No additional barriers to discharge, CPS to follow up in the community. 3) CSW to monitor infant's MDS, and will notify CPS of results. 4) No Further Intervention Required/No Barriers to Discharge     Sharyl Nimrod 01/06/2015, 1:55 PM

## 2015-04-17 NOTE — Lactation Note (Signed)
This note was copied from the chart of Veronica Jones [facility]. Lactation Consultation Note  Baby's UDS + for THC and benzodiazepines.  Mother given information sheet on Marijuana and BF compiled by [place].  It states that mothers using marijuana should not BF. Pump and dump encouraged for for 2 weeks.  Mom plans to do a drug test prior to resuming BF.  Per LCSW CPS will follow in the community. Patient Name: Veronica Jones Malicoat QMVHQ'IToday's Date: 04/17/2015     Maternal Data    Feeding Feeding Type: Formula Nipple Type: Slow - flow  LATCH Score/Interventions                      Lactation Tools Discussed/Used     Consult Status      Soyla DryerJoseph, Monick Rena 04/17/2015, 5:18 PM

## 2015-04-17 NOTE — Plan of Care (Signed)
Problem: Nutritional: Goal: Mother's verbalization of comfort with breastfeeding process will improve Outcome: Not Applicable Date Met:  70/96/43 Mom to pump and dump for 2 weeks

## 2015-04-17 NOTE — Progress Notes (Signed)
Post Partum Day 1 Subjective: no complaints, up ad lib, voiding and tolerating PO, small lochia, plans to breastfeed, Depo-Provera  Objective: Blood pressure 128/60, pulse 63, temperature 97.8 F (36.6 C), temperature source Oral, resp. rate 18, height 5\' 4"  (1.626 m), weight 83.915 kg (185 lb), last menstrual period 06/11/2014, SpO2 99 %, unknown if currently breastfeeding.  Physical Exam:  General: alert, cooperative and no distress Lochia:normal flow Chest: CTAB Heart: RRR no m/r/g Abdomen: +BS, soft, nontender,  Uterine Fundus: firm DVT Evaluation: No evidence of DVT seen on physical exam. Extremities: no edema   Recent Labs  04/16/15 1612  HGB 10.5*  HCT 30.9*    Assessment/Plan: Plan for discharge tomorrow and Lactation consult   LOS: 1 day   CRESENZO-DISHMAN,Skyla Champagne 04/17/2015, 7:30 AM

## 2015-04-18 ENCOUNTER — Encounter (HOSPITAL_COMMUNITY): Payer: Self-pay | Admitting: *Deleted

## 2015-04-18 LAB — COMPREHENSIVE METABOLIC PANEL
ALK PHOS: 96 U/L (ref 38–126)
ALT: 20 U/L (ref 14–54)
ANION GAP: 22 — AB (ref 5–15)
AST: 35 U/L (ref 15–41)
Albumin: 3.1 g/dL — ABNORMAL LOW (ref 3.5–5.0)
BUN: 8 mg/dL (ref 6–20)
CALCIUM: 9.3 mg/dL (ref 8.9–10.3)
CO2: 11 mmol/L — ABNORMAL LOW (ref 22–32)
CREATININE: 0.7 mg/dL (ref 0.44–1.00)
Chloride: 105 mmol/L (ref 101–111)
Glucose, Bld: 90 mg/dL (ref 65–99)
Potassium: 3.4 mmol/L — ABNORMAL LOW (ref 3.5–5.1)
Sodium: 138 mmol/L (ref 135–145)
TOTAL PROTEIN: 6.9 g/dL (ref 6.5–8.1)
Total Bilirubin: 0.4 mg/dL (ref 0.3–1.2)

## 2015-04-18 LAB — RAPID URINE DRUG SCREEN, HOSP PERFORMED
Amphetamines: NOT DETECTED
BENZODIAZEPINES: NOT DETECTED
Barbiturates: NOT DETECTED
COCAINE: NOT DETECTED
OPIATES: NOT DETECTED
TETRAHYDROCANNABINOL: POSITIVE — AB

## 2015-04-18 LAB — CBC
HCT: 34.1 % — ABNORMAL LOW (ref 36.0–46.0)
HEMOGLOBIN: 11 g/dL — AB (ref 12.0–15.0)
MCH: 29.3 pg (ref 26.0–34.0)
MCHC: 32.3 g/dL (ref 30.0–36.0)
MCV: 90.7 fL (ref 78.0–100.0)
Platelets: 129 10*3/uL — ABNORMAL LOW (ref 150–400)
RBC: 3.76 MIL/uL — AB (ref 3.87–5.11)
RDW: 15.5 % (ref 11.5–15.5)
WBC: 15.7 10*3/uL — ABNORMAL HIGH (ref 4.0–10.5)

## 2015-04-18 LAB — URINALYSIS W MICROSCOPIC (NOT AT ARMC)
BILIRUBIN URINE: NEGATIVE
Glucose, UA: NEGATIVE mg/dL
Ketones, ur: NEGATIVE mg/dL
NITRITE: NEGATIVE
Protein, ur: NEGATIVE mg/dL
SPECIFIC GRAVITY, URINE: 1.02 (ref 1.005–1.030)
Urobilinogen, UA: 0.2 mg/dL (ref 0.0–1.0)
pH: 5 (ref 5.0–8.0)

## 2015-04-18 LAB — PROTEIN / CREATININE RATIO, URINE
CREATININE, URINE: 42 mg/dL
PROTEIN CREATININE RATIO: 0.45 mg/mg{creat} — AB (ref 0.00–0.15)
TOTAL PROTEIN, URINE: 19 mg/dL

## 2015-04-18 MED ORDER — IBUPROFEN 600 MG PO TABS: 600.0000 mg | ORAL_TABLET | Freq: Four times a day (QID) | ORAL | Status: DC

## 2015-04-18 MED ORDER — LABETALOL HCL 5 MG/ML IV SOLN: 20.0000 mg | INTRAVENOUS | Status: DC | PRN

## 2015-04-18 MED ORDER — LACTATED RINGERS IV SOLN
INTRAVENOUS | Status: DC
  Administered 2015-04-18 – 2015-04-19 (×2): via INTRAVENOUS

## 2015-04-18 MED ORDER — MAGNESIUM SULFATE 50 % IJ SOLN
2.0000 g/h | INTRAVENOUS | Status: DC
  Administered 2015-04-18 – 2015-04-19 (×2): 2 g/h via INTRAVENOUS
  Filled 2015-04-18 (×2): qty 80

## 2015-04-18 MED ORDER — MAGNESIUM SULFATE BOLUS VIA INFUSION
4.0000 g | Freq: Once | INTRAVENOUS | Status: AC
  Administered 2015-04-18: 4 g via INTRAVENOUS
  Filled 2015-04-18: qty 500

## 2015-04-18 MED ORDER — HYDRALAZINE HCL 20 MG/ML IJ SOLN: 10.0000 mg | Freq: Once | INTRAMUSCULAR | Status: DC | PRN

## 2015-04-18 NOTE — Lactation Note (Signed)
This note was copied from the chart of Veronica Jones. Lactation Consultation Note  Patient Name: Veronica Arville CareMargo Ramthun ZOXWR'UToday's Date: 04/18/2015 Reason for consult: Other (Comment) Mom had questions about pumping and dumping and when she could start bf. After discussing mom's lab results and her future plans, she has decided to formula feed. She has handouts on marijuana and xanax.       Maternal Data    Feeding Feeding Type: Breast Fed Nipple Type: Slow - flow  LATCH Score/Interventions                      Lactation Tools Discussed/Used     Consult Status Consult Status: Complete    Rulon Eisenmengerlizabeth E Ketih Goodie 04/18/2015, 10:53 PM

## 2015-04-18 NOTE — MAU Note (Signed)
At 1320, Personnel from Visitor's Desk approached MAU and stated a patient was seizing outside the hospital on the sidewalk. Upon reaching the scene, found a visitor holding the patient in a seated position who stated that she called out to him in passing and he eased her to the ground. Patient found to be conscious but confused. Tia AlertPaige Grady, AC, Carol, rapid response nurse, Vonzella NippleJulie Wenzel and Nigel BridgemanVicki Latham also in attendance. Assisted to stretcher and transported to MAU. Dr. Emelda FearFerguson in room.

## 2015-04-18 NOTE — Progress Notes (Signed)
Pt transferred from MAU s/p Rapid Response call for possible seizure. Pt Alert & oriented, stated she felt hot & her "vision started to go so she called out to man passing by". Pt to be started on IV magnesium.

## 2015-04-18 NOTE — Progress Notes (Signed)
Post Partum Day 1 Note ZO:XWRUEAVWUJre:postpartum seizure. Subjective: I was called to MAU to evaluate this patient who was having an uneventful postpartum course to date,. Patient went to the gift shop, was noted by visitors there to have a syncopal episode and what appeared to be postictal behavior with tonic gross tonic-clonic movements. She was transported to the MAU for assessment. Interviewing the patient, the patient was unable to accurately identify the year. She remained oriented to place and person but not to time. There was no loss of urine. There was no evidence of injury  Objective: Blood pressure 120/74, pulse 56, temperature 98.3 F (36.8 C), temperature source Oral, resp. rate 18, height 5\' 4"  (1.626 m), weight 83.915 kg (185 lb), last menstrual period 06/11/2014, SpO2 99 %, unknown if currently breastfeeding.  Physical Exam:  General: alert, cooperative and slowed mentation cranial nerves grossly intact Lochia: appropriate Uterine Fundus: firm Incision:  DVT Evaluation: No evidence of DVT seen on physical exam.   Recent Labs  04/16/15 1612  HGB 10.5*  HCT 30.9*    Assessment/Plan: Apparent seizure-like activity and postpartum patient, presumably  eclampsia. We'll add magnesium sulfate, 24 hours, checking urine drug screen as precaution.     LOS: 2 days   Mikhail Hallenbeck V 04/18/2015, 1:59 PM

## 2015-04-18 NOTE — Progress Notes (Signed)
Rapid Response call to main entrance. Pt sitting on the ground with multiple CNM, NP, RN's, RT in attendance & assiting pt onto a stretcher. Pt taken to MAU where an IV was started.

## 2015-04-18 NOTE — Discharge Summary (Signed)
OB Discharge Summary     Patient Name: Veronica Jones DOB: Jan 19, 1991 MRN: [id number]  Date of admission: 04/16/2015 Delivering MD: Jacklyn Shell   Date of discharge: 04/18/2015  Admitting diagnosis: direct admit, induction 40.1w Intrauterine pregnancy: [redacted]w[redacted]d     Secondary diagnosis:  Principal Problem:   NSVD (normal spontaneous vaginal delivery) Active Problems:   Polyhydramnios in third trimester  Additional problems: none     Discharge diagnosis: Term Pregnancy Delivered                                                                                                Post partum procedures:none  Augmentation: Pitocin  Complications: None  Hospital course:  Onset of Labor With Vaginal Delivery     24 y.o. yo G1P1001 at [redacted]w[redacted]d was admitted in Latent Laboron 04/16/2015. Patient had an uncomplicated labor course as follows:  Membrane Rupture Time/Date: 7:35 PM ,04/16/2015   Intrapartum Procedures: Episiotomy: None [1]                                         Lacerations:  None [1]  Patient had a delivery of a Viable infant. 04/16/2015  Information for the patient's newborn:  Mikenna, Bunkley [[id number]]  Delivery Method: Vaginal, Spontaneous Delivery (Filed from Delivery Summary)    Pateint had an uncomplicated postpartum course.  She is ambulating, tolerating a regular diet, passing flatus, and urinating well. Patient is discharged home in stable condition on No discharge date for patient encounter.Marland Kitchen    Physical exam  Filed Vitals:   04/17/15 0230 04/17/15 0600 04/17/15 1748 04/18/15 0655  BP: 128/63 128/60 118/70 120/74  Pulse: 61 63 65 56  Temp: 98.6 F (37 C) 97.8 F (36.6 C) 98.6 F (37 C) 98.3 F (36.8 C)  TempSrc: Oral Oral Oral Oral  Resp: Height:      Weight:      SpO2:       General: alert, cooperative and no distress Lochia: appropriate Uterine Fundus: firm Incision: N/A DVT Evaluation: No evidence of DVT seen on physical  exam. Negative Homan's sign. Labs: Lab Results  Component Value Date   WBC 8.0 04/16/2015   HGB 10.5* 04/16/2015   HCT 30.9* 04/16/2015   MCV 86.6 04/16/2015   PLT 121* 04/16/2015   No flowsheet data found.  Discharge instruction: per After Visit Summary and "Baby and Me Booklet".  Medications:MEDICATIONS: See list below After visit meds:    Medication List    TAKE these medications        CONCEPT OB 130-92.4-1 MG Caps  Take 1 tablet by mouth daily.     ibuprofen 600 MG tablet  Commonly known as:  ADVIL,MOTRIN  Take 1 tablet (600 mg total) by mouth every 6 (six) hours.        Diet: routine diet  Activity: Advance as tolerated. Pelvic rest for 6 weeks.   Outpatient follow up:6 weeks Follow up Appt:Future Appointments Date Time Provider Department Center  04/22/2015 7:30 AM [facility]ED ROOM [facility] None   [facility]  Postpartum contraception: Depo Provera  Newborn Data: Live born female  Birth Weight: 5 lb 14 oz (2665 g) APGAR: 8, 9  Baby Feeding: Bottle and Breast Disposition:rooming in  04/18/2015 Federico FlakeKimberly Niles Newton, MD

## 2015-04-18 NOTE — Discharge Instructions (Signed)

## 2015-04-19 DIAGNOSIS — O152 Eclampsia in the puerperium: Secondary | ICD-10-CM

## 2015-04-19 HISTORY — DX: Eclampsia complicating the puerperium: O15.2

## 2015-04-19 MED ORDER — SODIUM CHLORIDE 0.9 % IJ SOLN: 3.0000 mL | INTRAMUSCULAR | Status: DC | PRN

## 2015-04-19 MED ORDER — SODIUM CHLORIDE 0.9 % IJ SOLN
3.0000 mL | Freq: Two times a day (BID) | INTRAMUSCULAR | Status: DC
  Administered 2015-04-19 (×2): 3 mL via INTRAVENOUS

## 2015-04-19 NOTE — Progress Notes (Addendum)
Post Partum Day 3 s/p postpartum seizure on PPD#2 prior to discharge Subjective: No further seizure activity.  On magnesium sulfate.  Denies any visual symptoms, headaches or other concerns. Baby doing well.  Objective: Blood pressure 124/74, pulse 74, temperature 98.6 F (37 C), temperature source Oral, resp. rate 16, height 5\' 4"  (1.626 m), weight 185 lb (83.915 kg), last menstrual period 06/11/2014, SpO2 98 %, unknown if currently breastfeeding.  Physical Exam:  General: alert, cranial nerves grossly intact Lungs: CTAB Heart: RRR Lochia: appropriate Uterine Fundus: firm Ext: No evidence of DVT seen on physical exam. 2+DTRs   Recent Labs  04/16/15 1612 04/18/15 1330  HGB 10.5* 11.0*  HCT 30.9* 34.1*   UDS + THC  Assessment/Plan: Apparent seizure-like activity and postpartum patient, presumably  eclampsia. Continue magnesium sulfate x 24 hours Will observe after magnesium sulfate Stable BP Routine postpartum care     LOS: 3 days   ANYANWU,UGONNA A, MD 04/19/2015, 10:44 AM

## 2015-04-19 NOTE — Progress Notes (Signed)
Pt requested to be set up with a breast pump. She wants to breast feed "once my system is cleared". Pump set up, instructions for use & cleaning given. Lactation notified.

## 2015-04-20 LAB — GLUCOSE, CAPILLARY: Glucose-Capillary: 85 mg/dL (ref 65–99)

## 2015-04-20 NOTE — Discharge Summary (Signed)
OB Discharge Summary    Patient Name: Veronica Jones DOB: 04/21/1991 MRN: [id number]  Date of admission: 04/16/2015 Delivering MD: Jacklyn ShellRESENZO-DISHMON, FRANCES   Date of discharge: 04/20/2015  Admitting diagnosis: direct admit, induction 40.1w for polyhydramnios Intrauterine pregnancy: 7854w1d     Secondary diagnosis:  Principal Problem:   Postpartum eclampsia Active Problems:   Polyhydramnios in third trimester   NSVD (normal spontaneous vaginal delivery)   Discharge diagnosis: Term Pregnancy Delivered and Eclampsia                                                                                                Post partum procedures:magnesium sulfate  Augmentation: Pitocin  Complications: Postpartum eclampsia on PPD#2  Hospital course:  Onset of Labor With Vaginal Delivery     24 y.o. yo G1P1001 at 5154w1d was admitted in Latent Laboron 04/16/2015. Patient had an uncomplicated labor course as follows:  Membrane Rupture Time/Date: 7:35 PM ,04/16/2015   Intrapartum Procedures: Episiotomy: None [1]                                         Lacerations:  None [1]  Patient had a delivery of a Viable infant. 04/16/2015  Information for the patient's newborn:  Nils Flackurner, Girl Adiyah [[id number]][[id number]]  Delivery Method: Vaginal, Spontaneous Delivery (Filed from Delivery Summary)   Pateint had an uncomplicated postpartum course initially and was to be discharged to home on PPD#2.  However, she had a witnessed tonic-clonic seizure, and she was immediately stabilized and treated with magnesium sulfate for presumed postpartum eclampsia.  She had a negative UDS, no other etiology for seizure noted.  She was on magnesium sulfate for 24 hours, no seizure activity was noted and had no concerning symptoms after magnesium sulfate was discontinued. Her BP always remained in normal range, normal labs.  She is ambulating, tolerating a regular diet, passing flatus, and urinating well. Patient is discharged home in stable  condition on 04/20/15.    Physical exam  Filed Vitals:   04/19/15 2259 04/20/15 0017 04/20/15 0018 04/20/15 0418  BP:  123/63  121/62  Pulse: 77 85 89 63  Temp:  98.5 F (36.9 C)    TempSrc:  Oral    Resp:  18  18  Height:      Weight:      SpO2: 100% 100%     General: alert and no distress Lochia: appropriate Lungs: CTAB Heart: RRR Uterine Fundus: firm DVT Evaluation: No evidence of DVT seen on physical exam. No significant calf/ankle edema. 2+DTRs  Labs: Lab Results  Component Value Date   WBC 15.7* 04/18/2015   HGB 11.0* 04/18/2015   HCT 34.1* 04/18/2015   MCV 90.7 04/18/2015   PLT 129* 04/18/2015   CMP Latest Ref Rng 04/18/2015  Glucose 65 - 99 mg/dL 90  BUN 6 - 20 mg/dL 8  Creatinine 7.820.44 - 9.561.00 mg/dL 2.130.70  Sodium 086135 - 578145 mmol/L 138  Potassium 3.5 - 5.1 mmol/L 3.4(L)  Chloride 101 - 111 mmol/L  105  CO2 22 - 32 mmol/L 11(L)  Calcium 8.9 - 10.3 mg/dL 9.3  Total Protein 6.5 - 8.1 g/dL 6.9  Total Bilirubin 0.3 - 1.2 mg/dL 0.4  Alkaline Phos 38 - 126 U/L 96  AST 15 - 41 U/L 35  ALT 14 - 54 U/L 20    Discharge instruction: per After Visit Summary and "Baby and Me Booklet".  After visit meds:    Medication List    TAKE these medications        CONCEPT OB 130-92.4-1 MG Caps  Take 1 tablet by mouth daily.     ibuprofen 600 MG tablet  Commonly known as:  ADVIL,MOTRIN  Take 1 tablet (600 mg total) by mouth every 6 (six) hours.        Diet: routine diet  Activity: Advance as tolerated. Pelvic rest for 6 weeks.   Future Appointments Date Time Provider Department Center  04/24/2015 10:15 AM [facility] NURSE [facility]OC  05/18/2015 1:45 PM Tereso Newcomer, MD [facility]   Postpartum contraception: Depo Provera  Newborn Data: Live born female  Birth Weight: 5 lb 14 oz (2665 g) APGAR: 8, 9  Baby Feeding: Bottle and Breast Disposition:home with mother   04/20/2015 Tereso Newcomer, MD

## 2015-04-20 NOTE — Lactation Note (Signed)
This note was copied from the chart of Girl Arville CareMargo Mcclarty. Lactation Consultation Note  Patient Name: Girl Arville CareMargo Salway LKGMW'NToday's Date: 04/20/2015 Reason for consult: Follow-up assessment Mom is pumping and dumping due to +UDS for THC and Benzodiazepines. Mom reports her milk is in and she is receiving 3+ oz from each breast with pumping. Breasts soft at this visit, reviewed engorgement care if needed. Advised to be sure she pumps every 3 hours for 15-20 minutes or earlier if breasts are filling. [facility], [facility] Mom will pump and dump for the next 2-3 weeks. Encouraged to call for questions/concerns.   Maternal Data    Feeding Feeding Type: Bottle Fed - Formula  LATCH Score/Interventions                      Lactation Tools Discussed/Used Tools: Pump Breast pump type: Double-Electric Breast Pump WIC Program: Yes   Consult Status Consult Status: Complete    Alfred LevinsGranger, Audrena Talaga Ann 04/20/2015, 11:13 AM

## 2015-04-20 NOTE — Progress Notes (Signed)
CSW spoke with S.Bookman, assigned CPS worker.  CSW provided update, including ongoing admission and anticipated discharge today.  CPS reported that she will attempt to meet with the MOB today at the hospital; however, there are no barriers to discharge if CPS cannot complete visit prior to discharge.   CSW to follow up PRN.

## 2015-04-22 ENCOUNTER — Inpatient Hospital Stay (HOSPITAL_COMMUNITY): Admission: RE | Admit: 2015-04-22 | Payer: No Typology Code available for payment source | Source: Ambulatory Visit

## 2015-05-08 ENCOUNTER — Other Ambulatory Visit (HOSPITAL_COMMUNITY): Payer: Self-pay | Admitting: Family Medicine

## 2015-05-18 ENCOUNTER — Ambulatory Visit: Payer: No Typology Code available for payment source | Admitting: Obstetrics & Gynecology

## 2015-11-08 ENCOUNTER — Emergency Department (HOSPITAL_COMMUNITY)
Admission: EM | Admit: 2015-11-08 | Discharge: 2015-11-08 | Disposition: A | Discharge status: Home / Self Care | Payer: Medicaid Other | Attending: Emergency Medicine | Admitting: Emergency Medicine

## 2015-11-08 DIAGNOSIS — Z5321 Procedure and treatment not carried out due to patient leaving prior to being seen by health care provider: Secondary | ICD-10-CM

## 2015-11-08 NOTE — ED Notes (Signed)
Pt checked in and then walked outside and back to parking lot.

## 2015-11-27 NOTE — ED Provider Notes (Signed)
Pt checked in then walked back to parking lot before being seen.   Veronica MondayErin Bobby Barton, MD 11/27/15 [phone]

## 2016-03-05 IMAGING — US US OB COMP LESS 14 WK
1 series · 13 of 28 positions shown · non-contrast
Comparison: None.

CLINICAL DATA: Confirm and date pregnancy.  Initial encounter.

EXAM:
OBSTETRIC <14 WK US AND TRANSVAGINAL OB US
TECHNIQUE: Both transabdominal and transvaginal ultrasound examinations were
performed for complete evaluation of the gestation as well as the
maternal uterus, adnexal regions, and pelvic cul-de-sac.
Transvaginal technique was performed to assess early pregnancy.

[Series 1: us ob comp less 14 wks · 13 of 74 slices shown]
[im 3/74]
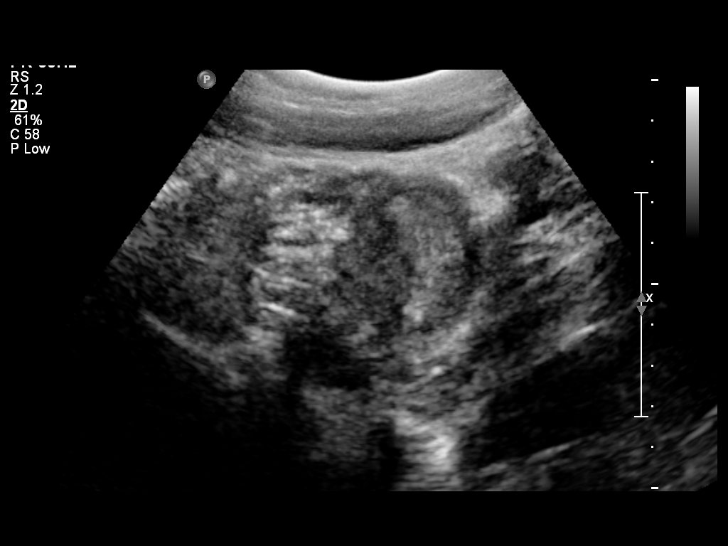
[im 9/74]
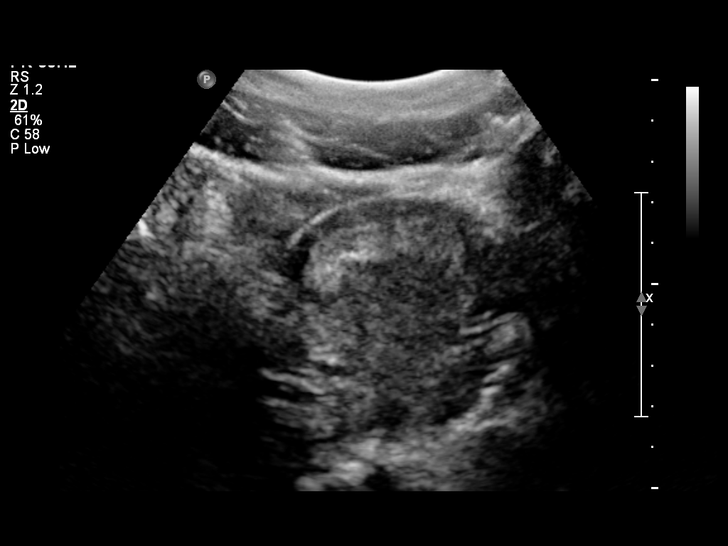
[im 14/74]
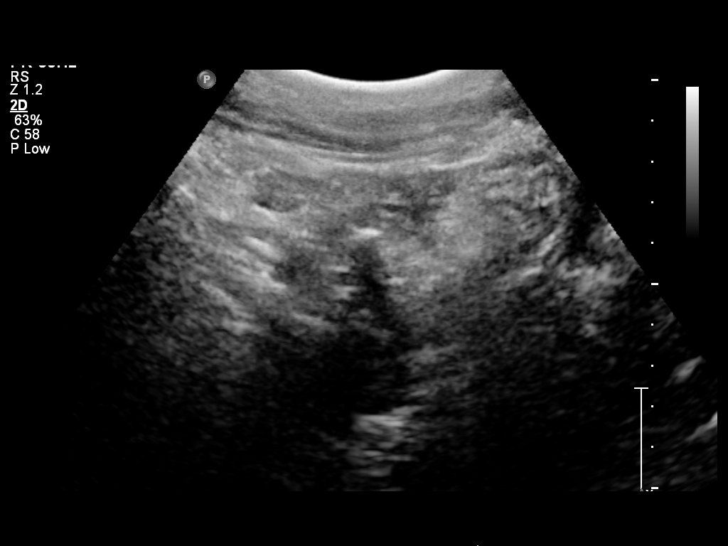
[im 19/74]
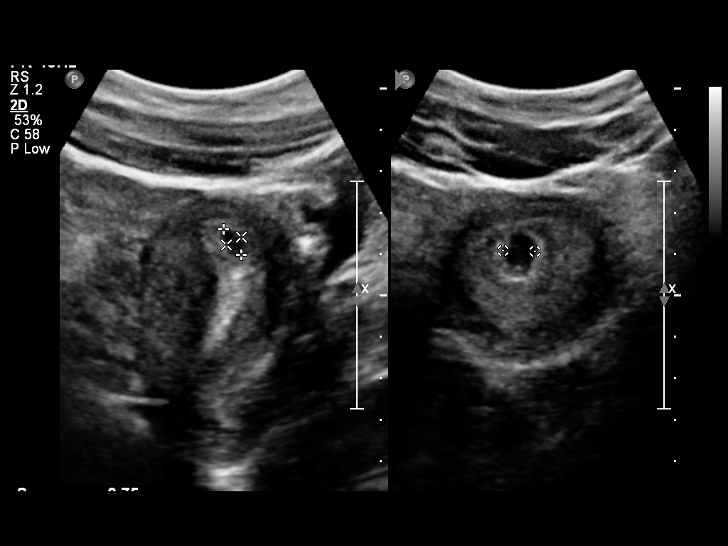
[im 25/74]
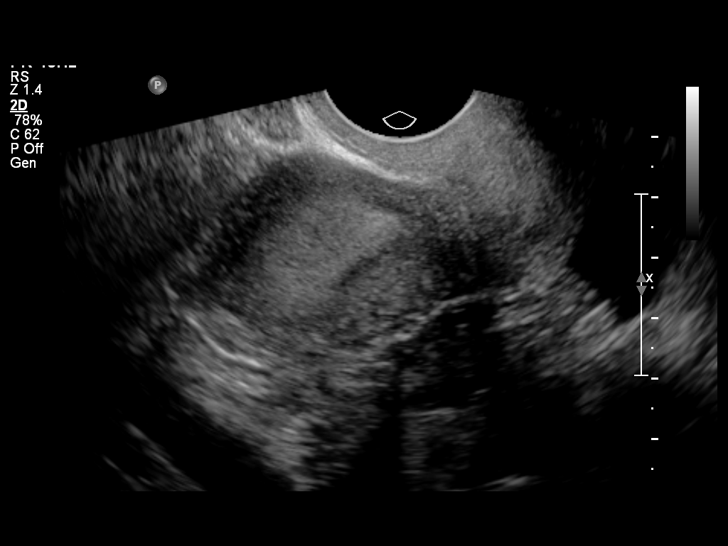
[im 30/74]
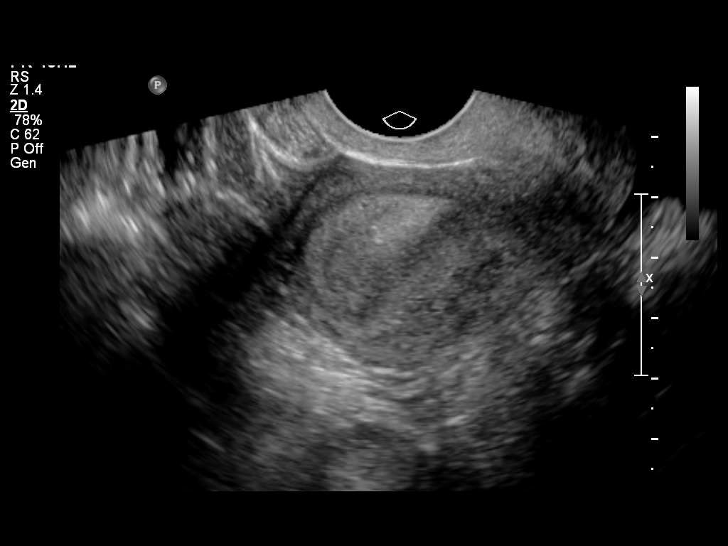
[im 38/74]
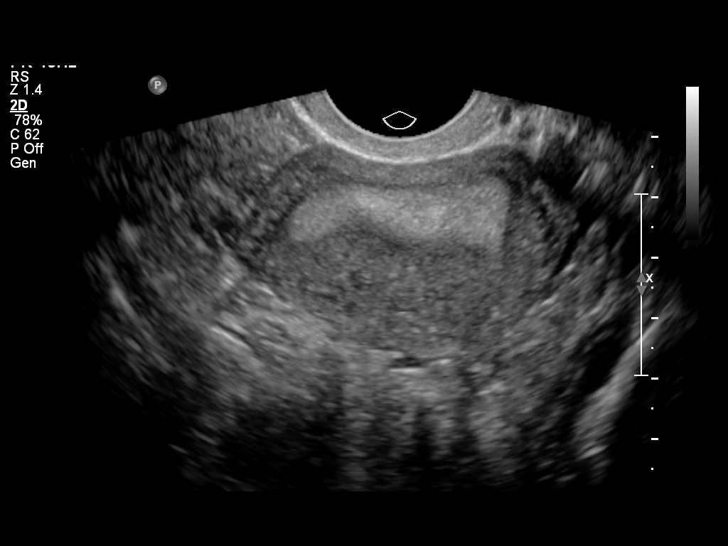
[im 44/74]
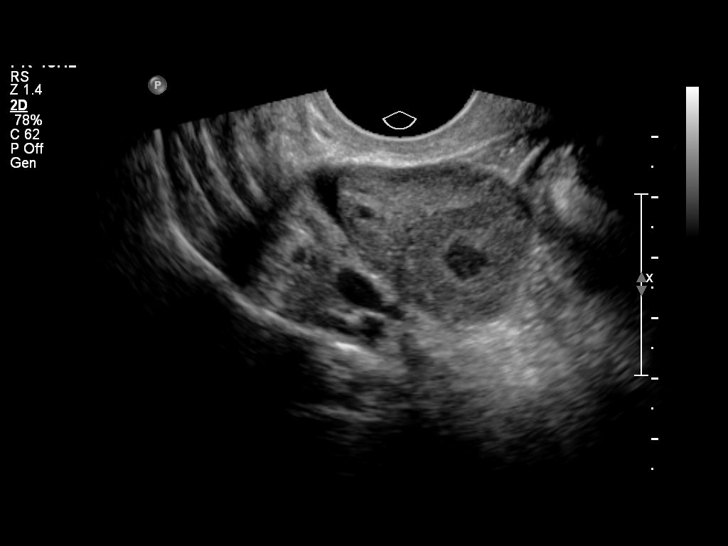
[im 49/74]
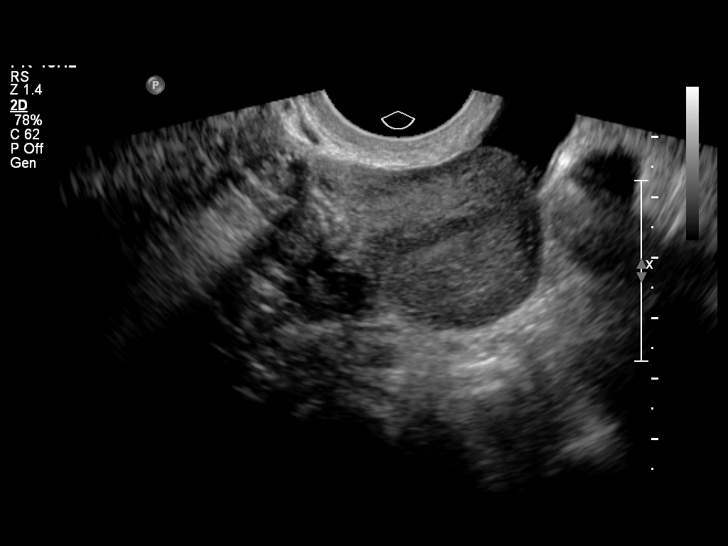
[im 55/74]
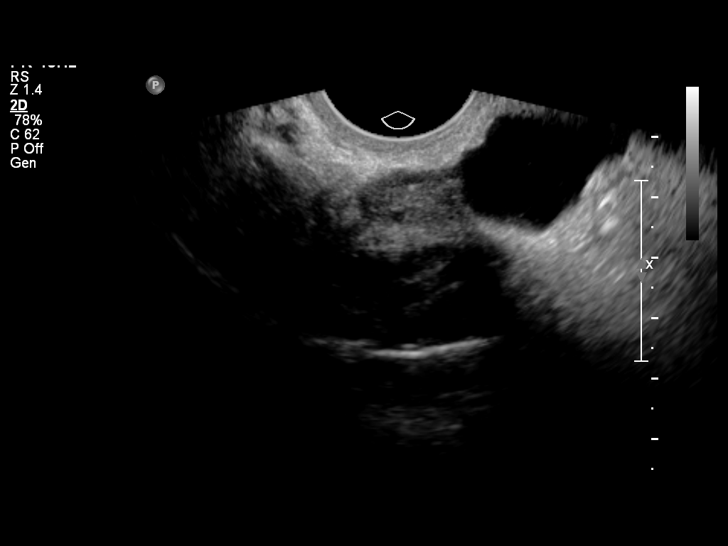
[im 60/74]
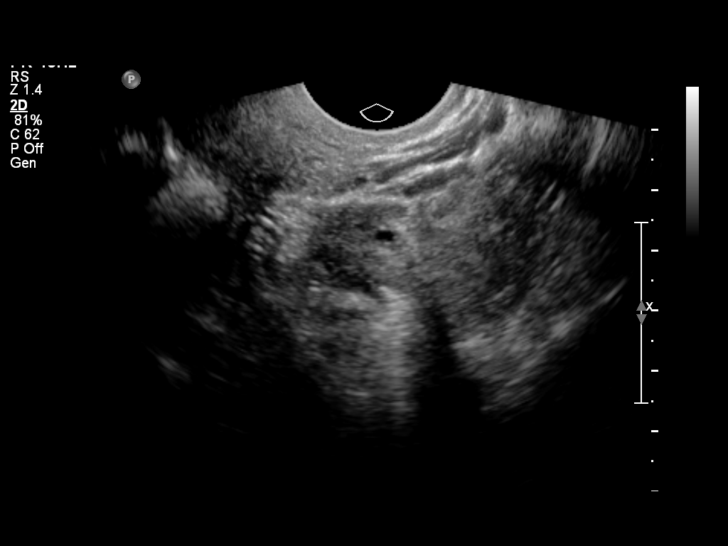
[im 65/74]
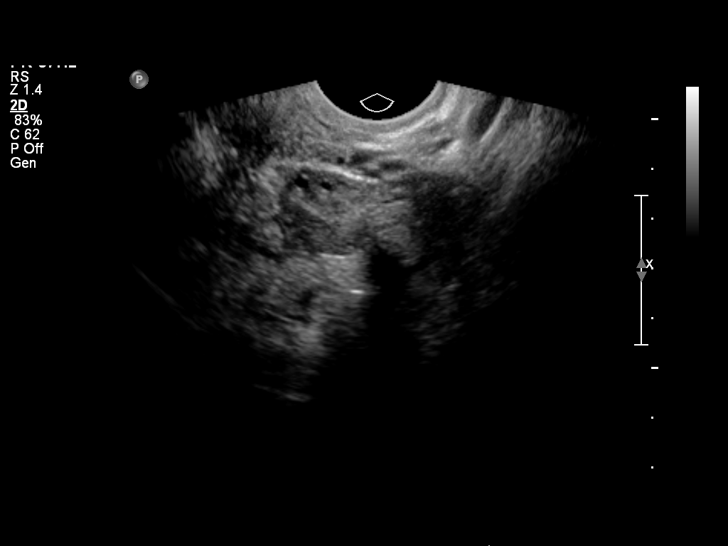
[im 71/74]
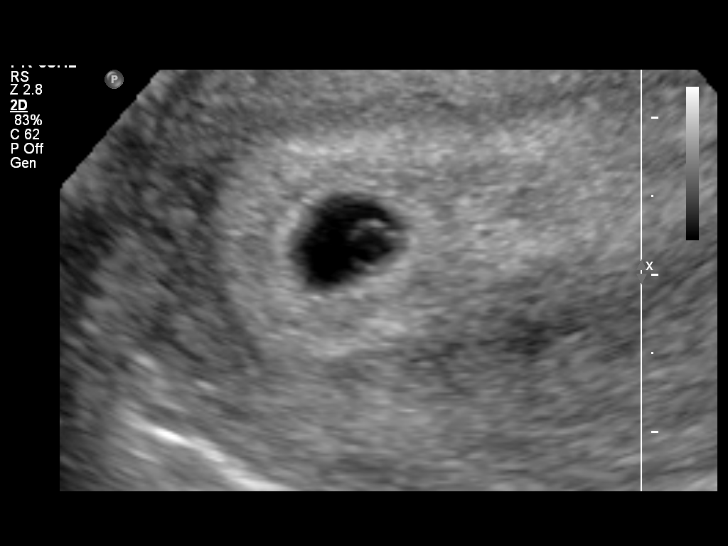

[13 of 28 positions shown; findings below may reference images not displayed]

FINDINGS: Intrauterine gestational sac: Visualized/normal in shape.

Yolk sac:  Yes

Embryo:  No

Cardiac Activity: N/A

MSD: 7.7  mm   5 w   3  d

Maternal uterus/adnexae: There may be a small amount of subchorionic
hemorrhage. The uterus is otherwise unremarkable.

The ovaries are within normal limits. The right ovary measures 3.7 x
2.9 x 3.3 cm, while the left ovary measures 2.7 x 1.7 x 2.2 cm. No
suspicious adnexal masses are seen; there is no evidence for ovarian
torsion.

A small amount of free fluid is seen within the pelvis and right
adnexa.
IMPRESSION: 1. Intrauterine gestational sac noted, with a mean sac diameter of
0.8 cm, corresponding to a gestational age of 5 weeks 3 days. This
does not match the gestational age by LMP, though it remains too
early to assign an estimated date of delivery, as an embryo is not
yet seen. The yolk sac is visualized.
2. Suggestion of small amount of subchorionic hemorrhage.

## 2017-05-18 ENCOUNTER — Encounter (HOSPITAL_COMMUNITY): Payer: Self-pay | Admitting: Emergency Medicine

## 2017-05-18 ENCOUNTER — Other Ambulatory Visit: Payer: Self-pay

## 2017-05-18 ENCOUNTER — Emergency Department (HOSPITAL_COMMUNITY)
Admission: EM | Admit: 2017-05-18 | Discharge: 2017-05-18 | Disposition: A | Discharge status: Home / Self Care | Payer: Medicaid Other | Attending: Emergency Medicine | Admitting: Emergency Medicine

## 2017-05-18 DIAGNOSIS — F1910 Other psychoactive substance abuse, uncomplicated: Secondary | ICD-10-CM

## 2017-05-18 DIAGNOSIS — Z87891 Personal history of nicotine dependence: Secondary | ICD-10-CM | POA: Insufficient documentation

## 2017-05-18 DIAGNOSIS — T43641A Poisoning by ecstasy, accidental (unintentional), initial encounter: Secondary | ICD-10-CM | POA: Diagnosis present

## 2017-05-18 LAB — CBC WITH DIFFERENTIAL/PLATELET
Basophils Absolute: 0 10*3/uL (ref 0.0–0.1)
Basophils Relative: 0 %
EOS ABS: 0.1 10*3/uL (ref 0.0–0.7)
EOS PCT: 2 %
HCT: 35.5 % — ABNORMAL LOW (ref 36.0–46.0)
Hemoglobin: 12.1 g/dL (ref 12.0–15.0)
LYMPHS ABS: 1.6 10*3/uL (ref 0.7–4.0)
LYMPHS PCT: 23 %
MCH: 31.4 pg (ref 26.0–34.0)
MCHC: 34.1 g/dL (ref 30.0–36.0)
MCV: 92.2 fL (ref 78.0–100.0)
MONO ABS: 0.6 10*3/uL (ref 0.1–1.0)
Monocytes Relative: 8 %
Neutro Abs: 4.5 10*3/uL (ref 1.7–7.7)
Neutrophils Relative %: 67 %
PLATELETS: 120 10*3/uL — AB (ref 150–400)
RBC: 3.85 MIL/uL — AB (ref 3.87–5.11)
RDW: 13.1 % (ref 11.5–15.5)
WBC: 6.7 10*3/uL (ref 4.0–10.5)

## 2017-05-18 LAB — BASIC METABOLIC PANEL
Anion gap: 6 (ref 5–15)
BUN: 10 mg/dL (ref 6–20)
CO2: 23 mmol/L (ref 22–32)
CREATININE: 0.79 mg/dL (ref 0.44–1.00)
Calcium: 8.6 mg/dL — ABNORMAL LOW (ref 8.9–10.3)
Chloride: 113 mmol/L — ABNORMAL HIGH (ref 101–111)
GFR calc Af Amer: >60 mL/min (ref >60)
GLUCOSE: 111 mg/dL — AB (ref 65–99)
Potassium: 3.9 mmol/L (ref 3.5–5.1)
SODIUM: 142 mmol/L (ref 135–145)

## 2017-05-18 LAB — I-STAT BETA HCG BLOOD, ED (MC, WL, AP ONLY): I-stat hCG, quantitative: <5 m[IU]/mL (ref <5)

## 2017-05-18 LAB — ETHANOL

## 2017-05-18 NOTE — Discharge Instructions (Signed)
Outpatient resources for substance abuse treatment have been provided in this discharge summary should your desire their services.

## 2017-05-18 NOTE — ED Notes (Addendum)
Bed: UE45WA16 Expected date:  Expected time:  Means of arrival:  Comments: EMS 26 yo female took ecstasy and is erratic

## 2017-05-18 NOTE — ED Notes (Signed)
Pt aware that urine sample is needed.  

## 2017-05-18 NOTE — ED Provider Notes (Signed)
[facility]-EMERGENCY DEPT Provider Note   CSN: [id number] Arrival date & time: 05/18/17  0222     History   Chief Complaint Chief Complaint  Patient presents with  . Drug Problem    HPI Veronica Jones is a 26 y.o. female.  Patient is a 26 year old female with no significant past medical history presenting for evaluation of drug ingestion.  She reports taking ecstasy for the first time this evening.  She reports "going into a dream that she could not get out of".  Her boyfriend apparently called 911 due to her behaving erratically.  The patient is now feeling somewhat better after arriving here.  She denies any discomfort and has no other symptoms.   The history is provided by the patient.  Drug Problem  This is a new problem. The current episode started 1 to 2 hours ago. The problem occurs constantly. The problem has been gradually improving. Nothing aggravates the symptoms. Nothing relieves the symptoms.    Past Medical History:  Diagnosis Date  . Chlamydia 2013    Patient Active Problem List   Diagnosis Date Noted  . Postpartum eclampsia 04/19/2015  . NSVD (normal spontaneous vaginal delivery) 04/18/2015  . Polyhydramnios in third trimester 04/16/2015    Past Surgical History:  Procedure Laterality Date  . NO PAST SURGERIES      OB History    Gravida Para Term Preterm AB Living   1 1 1     1    SAB TAB Ectopic Multiple Live Births         0 1       Home Medications    Prior to Admission medications   Medication Sig Start Date End Date Taking? Authorizing Provider  ibuprofen (ADVIL,MOTRIN) 600 MG tablet TAKE 1 TABLET (600 MG TOTAL) BY MOUTH EVERY 6 (SIX) HOURS. 05/08/15   Federico FlakeNewton, Kimberly Niles, MD  Prenat w/o A Vit-FeFum-FePo-FA (CONCEPT OB) 130-92.4-1 MG CAPS Take 1 tablet by mouth daily. 09/09/14   Dorathy KinsmanSmith, [place], CNM    Family History Family History  Problem Relation Age of Onset  . Hypertension Paternal Grandmother   . Cancer  Paternal Grandmother        breast  . Diabetes Paternal Grandmother   . Hypertension Mother   . Hypertension Father   . Diabetes Maternal Aunt     Social History Social History   Tobacco Use  . Smoking status: Former Smoker    Packs/day: 0.50    Years: 1.00    Pack years: 0.50    Types: Cigarettes    Last attempt to quit: 08/03/2014    Years since quitting: 2.7  . Smokeless tobacco: Never Used  Substance Use Topics  . Alcohol use: No    Alcohol/week: 0.0 oz    Comment: not since pregnancy  . Drug use: Yes    Types: Marijuana    Comment: 1 week asgo     Allergies   Amoxicillin and Penicillins   Review of Systems Review of Systems  All other systems reviewed and are negative.    Physical Exam Updated Vital Signs BP 123/68 (BP Location: Left Arm)   Pulse 81   Temp 98 F (36.7 C) (Oral)   Resp 16   LMP  (LMP Unknown)   SpO2 99%   Physical Exam  Constitutional: She is oriented to person, place, and time. She appears well-developed and well-nourished. No distress.  Patient is somewhat somnolent, but easily arousable and appropriate  HENT:  Head: Normocephalic  and atraumatic.  Eyes: EOM are normal. Pupils are equal, round, and reactive to light.  Neck: Normal range of motion. Neck supple.  Cardiovascular: Normal rate and regular rhythm. Exam reveals no gallop and no friction rub.  No murmur heard. Pulmonary/Chest: Effort normal and breath sounds normal. No respiratory distress. She has no wheezes.  Abdominal: Soft. Bowel sounds are normal. She exhibits no distension. There is no tenderness.  Musculoskeletal: Normal range of motion.  Neurological: She is alert and oriented to person, place, and time. No cranial nerve deficit. She exhibits normal muscle tone. Coordination normal.  Skin: Skin is warm and dry. She is not diaphoretic.  Nursing note and vitals reviewed.    ED Treatments / Results  Labs (all labs ordered are listed, but only abnormal results are  displayed) Labs Reviewed - No data to display  EKG  EKG Interpretation None       Radiology No results found.  Procedures Procedures (including critical care time)  Medications Ordered in ED Medications - No data to display   Initial Impression / Assessment and Plan / ED Course  I have reviewed the triage vital signs and the nursing notes.  Pertinent labs & imaging results that were available during my care of the patient were reviewed by me and considered in my medical decision making (see chart for details).  Patient brought here by EMS after taking ecstasy and having unfavorable results.  She reports feeling as though she is in a dream she cannot wake up from.  Her boyfriend called 911.  Shortly after arriving here, her condition improved and she began to feel much better.  She has been observed for several hours and is now resting comfortably and feels fine.  Her laboratory studies are unremarkable.  Her mother is here to pick her up and is comfortable with taking her home.  The mother did ask for some resources to help her daughter with her drug issues.  She will be given the resource guide and instructed to call should her daughter desire help.  Final Clinical Impressions(s) / ED Diagnoses   Final diagnoses:  None    ED Discharge Orders    None       Geoffery Lyonselo, Navia Lindahl, MD 05/18/17 [phone]

## 2017-05-18 NOTE — ED Triage Notes (Signed)
Pt coming from home. Pt took ecstasy with boyfriend at psych housing facility. Pts boyfriend called EMS for pt having syncopal episode and vomiting. Pt was able to answer some questions appropriately.   118/78 110 HR Resp 16

## 2017-05-18 NOTE — ED Notes (Signed)
Attempted to get patient to provide urine specimen but too sleepy.

## 2017-07-22 ENCOUNTER — Inpatient Hospital Stay (HOSPITAL_COMMUNITY)
Admission: AD | Admit: 2017-07-22 | Discharge: 2017-07-22 | Disposition: A | Discharge status: Home / Self Care | Payer: Medicaid Other | Source: Ambulatory Visit | Attending: Obstetrics and Gynecology | Admitting: Obstetrics and Gynecology

## 2017-07-22 ENCOUNTER — Encounter (HOSPITAL_COMMUNITY): Payer: Self-pay

## 2017-07-22 DIAGNOSIS — B9689 Other specified bacterial agents as the cause of diseases classified elsewhere: Secondary | ICD-10-CM | POA: Diagnosis not present

## 2017-07-22 DIAGNOSIS — Z833 Family history of diabetes mellitus: Secondary | ICD-10-CM | POA: Insufficient documentation

## 2017-07-22 DIAGNOSIS — N760 Acute vaginitis: Secondary | ICD-10-CM | POA: Diagnosis not present

## 2017-07-22 DIAGNOSIS — Z880 Allergy status to penicillin: Secondary | ICD-10-CM | POA: Diagnosis not present

## 2017-07-22 DIAGNOSIS — R1110 Vomiting, unspecified: Secondary | ICD-10-CM | POA: Insufficient documentation

## 2017-07-22 DIAGNOSIS — Z3202 Encounter for pregnancy test, result negative: Secondary | ICD-10-CM | POA: Insufficient documentation

## 2017-07-22 DIAGNOSIS — Z8619 Personal history of other infectious and parasitic diseases: Secondary | ICD-10-CM | POA: Insufficient documentation

## 2017-07-22 DIAGNOSIS — Z87891 Personal history of nicotine dependence: Secondary | ICD-10-CM | POA: Diagnosis not present

## 2017-07-22 DIAGNOSIS — Z803 Family history of malignant neoplasm of breast: Secondary | ICD-10-CM | POA: Insufficient documentation

## 2017-07-22 DIAGNOSIS — R109 Unspecified abdominal pain: Secondary | ICD-10-CM | POA: Insufficient documentation

## 2017-07-22 DIAGNOSIS — N912 Amenorrhea, unspecified: Secondary | ICD-10-CM

## 2017-07-22 DIAGNOSIS — Z8249 Family history of ischemic heart disease and other diseases of the circulatory system: Secondary | ICD-10-CM | POA: Diagnosis not present

## 2017-07-22 LAB — URINALYSIS, ROUTINE W REFLEX MICROSCOPIC
BILIRUBIN URINE: NEGATIVE
GLUCOSE, UA: NEGATIVE mg/dL
HGB URINE DIPSTICK: NEGATIVE
Ketones, ur: NEGATIVE mg/dL
NITRITE: NEGATIVE
PROTEIN: NEGATIVE mg/dL
Specific Gravity, Urine: 1.021 (ref 1.005–1.030)
pH: 5 (ref 5.0–8.0)

## 2017-07-22 LAB — WET PREP, GENITAL
Sperm: NONE SEEN
Trich, Wet Prep: NONE SEEN
Yeast Wet Prep HPF POC: NONE SEEN

## 2017-07-22 LAB — POCT PREGNANCY, URINE: Preg Test, Ur: NEGATIVE

## 2017-07-22 MED ORDER — METRONIDAZOLE 500 MG PO TABS: 500.0000 mg | ORAL_TABLET | Freq: Two times a day (BID) | ORAL | 0 refills | Status: DC

## 2017-07-22 NOTE — MAU Note (Signed)
Patient presents with no period for 43 days, took pregnancy test was negative, having nausea, abdominal pain.

## 2017-07-22 NOTE — MAU Note (Signed)
Was to get Depo in July did not get, LMP 05/13/17

## 2017-07-22 NOTE — MAU Provider Note (Signed)
History   27 yo female was on DMPA but did not go back for shot last shot in April, had period in December but not one since that time.   CSN: [id number]  Arrival date & time 07/22/17  1041   None     Chief Complaint  Patient presents with  . Abdominal Pain  . Emesis  . Amenorrhea    HPI  Past Medical History:  Diagnosis Date  . Chlamydia 2013    Past Surgical History:  Procedure Laterality Date  . NO PAST SURGERIES      Family History  Problem Relation Age of Onset  . Hypertension Paternal Grandmother   . Cancer Paternal Grandmother        breast  . Diabetes Paternal Grandmother   . Hypertension Mother   . Hypertension Father   . Diabetes Maternal Aunt     Social History   Tobacco Use  . Smoking status: Former Smoker    Packs/day: 0.50    Years: 1.00    Pack years: 0.50    Types: Cigarettes    Last attempt to quit: 08/03/2014    Years since quitting: 2.9  . Smokeless tobacco: Never Used  Substance Use Topics  . Alcohol use: No    Alcohol/week: 0.0 oz    Comment: not since pregnancy  . Drug use: Yes    Types: Marijuana    Comment: 1 week asgo    OB History    Gravida Para Term Preterm AB Living   1 1 1     1    SAB TAB Ectopic Multiple Live Births         0 1      Review of Systems  Constitutional: Negative.   HENT: Negative.   Eyes: Negative.   Respiratory: Negative.   Cardiovascular: Negative.   Gastrointestinal: Positive for abdominal pain.  Endocrine: Negative.   Genitourinary: Negative.   Musculoskeletal: Negative.   Skin: Negative.   Allergic/Immunologic: Negative.   Neurological: Negative.   Hematological: Negative.   Psychiatric/Behavioral: Negative.     Allergies  Amoxicillin and Penicillins  Home Medications    BP (!) 142/74 (BP Location: Right Arm)   Pulse 93   Temp 97.9 F (36.6 C) (Oral)   Resp 16   Ht 5\' 4"  (1.626 m)   Wt 173 lb (78.5 kg)   LMP 05/13/2017   BMI 29.70 kg/m   Physical Exam  Constitutional:  She is oriented to person, place, and time. She appears well-developed and well-nourished.  HENT:  Head: Normocephalic.  Eyes: Pupils are equal, round, and reactive to light.  Neck: Normal range of motion.  Cardiovascular: Normal rate, regular rhythm, normal heart sounds and intact distal pulses.  Pulmonary/Chest: Effort normal and breath sounds normal.  Abdominal: Soft. Bowel sounds are normal.  Genitourinary: Vagina normal and uterus normal.  Musculoskeletal: Normal range of motion.  Neurological: She is alert and oriented to person, place, and time. She has normal reflexes.  Skin: Skin is warm and dry.  Psychiatric: She has a normal mood and affect. Her behavior is normal. Judgment and thought content normal.    MAU Course  Procedures (including critical care time)  Labs Reviewed  WET PREP, GENITAL  URINALYSIS, ROUTINE W REFLEX MICROSCOPIC  POCT PREGNANCY, URINE  GC/CHLAMYDIA PROBE AMP ([place]) NOT AT [facility]   No results found.   No diagnosis found.    MDM  VSS, heart RRR, LCTAB. abd soft and non tender. UPT neg. Wet  prep pos clue  . Cultures obtained. Will treat with flagyl and d/c home

## 2017-07-22 NOTE — Discharge Instructions (Signed)
Bacterial Vaginosis Bacterial vaginosis is a vaginal infection that occurs when the normal balance of bacteria in the vagina is disrupted. It results from an overgrowth of certain bacteria. This is the most common vaginal infection among women ages 15-44. Because bacterial vaginosis increases your risk for STIs (sexually transmitted infections), getting treated can help reduce your risk for chlamydia, gonorrhea, herpes, and HIV (human immunodeficiency virus). Treatment is also important for preventing complications in pregnant women, because this condition can cause an early (premature) delivery. What are the causes? This condition is caused by an increase in harmful bacteria that are normally present in small amounts in the vagina. However, the reason that the condition develops is not fully understood. What increases the risk? The following factors may make you more likely to develop this condition:  Having a new sexual partner or multiple sexual partners.  Having unprotected sex.  Douching.  Having an intrauterine device (IUD).  Smoking.  Drug and alcohol abuse.  Taking certain antibiotic medicines.  Being pregnant.  You cannot get bacterial vaginosis from toilet seats, bedding, swimming pools, or contact with objects around you. What are the signs or symptoms? Symptoms of this condition include:  Grey or white vaginal discharge. The discharge can also be watery or foamy.  A fish-like odor with discharge, especially after sexual intercourse or during menstruation.  Itching in and around the vagina.  Burning or pain with urination.  Some women with bacterial vaginosis have no signs or symptoms. How is this diagnosed? This condition is diagnosed based on:  Your medical history.  A physical exam of the vagina.  Testing a sample of vaginal fluid under a microscope to look for a large amount of bad bacteria or abnormal cells. Your health care provider may use a cotton swab  or a small wooden spatula to collect the sample.  How is this treated? This condition is treated with antibiotics. These may be given as a pill, a vaginal cream, or a medicine that is put into the vagina (suppository). If the condition comes back after treatment, a second round of antibiotics may be needed. Follow these instructions at home: Medicines  Take over-the-counter and prescription medicines only as told by your health care provider.  Take or use your antibiotic as told by your health care provider. Do not stop taking or using the antibiotic even if you start to feel better. General instructions  If you have a female sexual partner, tell her that you have a vaginal infection. She should see her health care provider and be treated if she has symptoms. If you have a female sexual partner, he does not need treatment.  During treatment: ? Avoid sexual activity until you finish treatment. ? Do not douche. ? Avoid alcohol as directed by your health care provider. ? Avoid breastfeeding as directed by your health care provider.  Drink enough water and fluids to keep your urine clear or pale yellow.  Keep the area around your vagina and rectum clean. ? Wash the area daily with warm water. ? Wipe yourself from front to back after using the toilet.  Keep all follow-up visits as told by your health care provider. This is important. How is this prevented?  Do not douche.  Wash the outside of your vagina with warm water only.  Use protection when having sex. This includes latex condoms and dental dams.  Limit how many sexual partners you have. To help prevent bacterial vaginosis, it is best to have sex with just   one partner (monogamous).  Make sure you and your sexual partner are tested for STIs.  Wear cotton or cotton-lined underwear.  Avoid wearing tight pants and pantyhose, especially during summer.  Limit the amount of alcohol that you drink.  Do not use any products that  contain nicotine or tobacco, such as cigarettes and e-cigarettes. If you need help quitting, ask your health care provider.  Do not use illegal drugs. Where to find more information:  Centers for Disease Control and Prevention: www.cdc.gov/std  American Sexual Health Association (ASHA): www.ashastd.org  U.S. Department of Health and Human Services, Office on Women's Health: www.womenshealth.gov/ or https://www.womenshealth.gov/a-z-topics/bacterial-vaginosis Contact a health care provider if:  Your symptoms do not improve, even after treatment.  You have more discharge or pain when urinating.  You have a fever.  You have pain in your abdomen.  You have pain during sex.  You have vaginal bleeding between periods. Summary  Bacterial vaginosis is a vaginal infection that occurs when the normal balance of bacteria in the vagina is disrupted.  Because bacterial vaginosis increases your risk for STIs (sexually transmitted infections), getting treated can help reduce your risk for chlamydia, gonorrhea, herpes, and HIV (human immunodeficiency virus). Treatment is also important for preventing complications in pregnant women, because the condition can cause an early (premature) delivery.  This condition is treated with antibiotic medicines. These may be given as a pill, a vaginal cream, or a medicine that is put into the vagina (suppository). This information is not intended to replace advice given to you by your health care provider. Make sure you discuss any questions you have with your health care provider. Document Released: 05/30/2005 Document Revised: 10/03/2016 Document Reviewed: 02/13/2016 Elsevier Interactive Patient Education  2018 [employer]vier Inc.  

## 2017-07-24 LAB — GC/CHLAMYDIA PROBE AMP (~~LOC~~) NOT AT ARMC
Chlamydia: NEGATIVE
Neisseria Gonorrhea: NEGATIVE

## 2018-04-11 ENCOUNTER — Ambulatory Visit (HOSPITAL_COMMUNITY)
Admission: EM | Admit: 2018-04-11 | Discharge: 2018-04-11 | Disposition: A | Discharge status: Home / Self Care | Payer: Medicaid Other | Attending: Family Medicine | Admitting: Family Medicine

## 2018-04-11 ENCOUNTER — Encounter (HOSPITAL_COMMUNITY): Payer: Self-pay | Admitting: Emergency Medicine

## 2018-04-11 DIAGNOSIS — M25562 Pain in left knee: Secondary | ICD-10-CM | POA: Diagnosis not present

## 2018-04-11 DIAGNOSIS — G8929 Other chronic pain: Secondary | ICD-10-CM | POA: Diagnosis not present

## 2018-04-11 MED ORDER — DICLOFENAC SODIUM 75 MG PO TBEC: 75.0000 mg | DELAYED_RELEASE_TABLET | Freq: Two times a day (BID) | ORAL | 0 refills | Status: DC

## 2018-04-11 NOTE — ED Triage Notes (Addendum)
Pt c/o pain in the back of her L leg, painful behind her L knee. Pt states "I think there is fluid behind it". Denies hx of same. States at first she thought it was a pulled muscle. Pt states pain has been going on for months

## 2018-04-11 NOTE — ED Provider Notes (Signed)
[facility]   [id number] 04/11/18 Arrival Time: 1003  ASSESSMENT & PLAN:  1. Chronic pain of left knee   Non-traumatic. Likely inflammatory.  Meds ordered this encounter  Medications  . diclofenac (VOLTAREN) 75 MG EC tablet    Sig: Take 1 tablet (75 mg total) by mouth 2 (two) times daily.    Dispense:  14 tablet    Refill:  0    Orders Placed This Encounter  Procedures  . Apply knee sleeve  To use with weight bearing.  Follow-up Information    [facility].   Specialty:  Urgent Care Why:  As needed. Contact information: [address] [place] [address] [phone]          Discharge Instructions     If you are not improving, you may benefit from follow up with an orthopaedist or sports medicine specialist.  Reviewed expectations re: course of current medical issues. Questions answered. Outlined signs and symptoms indicating need for more acute intervention. Patient verbalized understanding. After Visit Summary given.  SUBJECTIVE: History from: patient. Veronica Jones is a 27 y.o. female who reports intermittent mild to moderate pain of her left posterior knee; described as aching without radiation. Injury/trama: no. Onset: gradual, present for several months. Symptoms have stabilized since beginning. Relieved by: rest. Worsened by: prolonged standing/walking. Associated symptoms: none reported. Extremity sensation changes or weakness: none. Self treatment: tried OTCs without relief of pain. History of similar: no.  Past Surgical History:  Procedure Laterality Date  . NO PAST SURGERIES       ROS: As per HPI.   OBJECTIVE:  Vitals:   04/11/18 1017  BP: 111/62  Pulse: 81  Resp: 16  Temp: 97.8 F (36.6 C)  SpO2: 100%    General appearance: alert; no distress Extremities: warm and well perfused; poorly localized mild to moderate discomfort reported over her left superior and medial  knee; without gross deformities; with no swelling/effusion and no bruising; ROM; normal but with reported discomfort; overall no specific bony tenderness on exam; no instability CV: brisk extremity capillary refill; 2+ DP/PT pulses of LLE. Skin: warm and dry; no visible rashes Neurologic: normal gait; normal reflexes of LLE; normal sensation of LLE Psychological: alert and cooperative; normal mood and affect  Allergies  Allergen Reactions  . Amoxicillin Hives  . Penicillins Hives    Has patient had a PCN reaction causing immediate rash, facial/tongue/throat swelling, SOB or lightheadedness with hypotension: No Has patient had a PCN reaction causing severe rash involving mucus membranes or skin necrosis: No Has patient had a PCN reaction that required hospitalization No Has patient had a PCN reaction occurring within the last 10 years: No If all of the above answers are "NO", then may proceed with Cephalosporin use.     Past Medical History:  Diagnosis Date  . Chlamydia 2013   Social History   Socioeconomic History  . Marital status: Single    Spouse name: Not on file  . Number of children: 0  . Years of education: Not on file  . Highest education level: Not on file  Occupational History  . 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 Financial resource strain: Not on file  . Food insecurity:    Worry: Not on file    Inability: Not on file  . Transportation needs:    Medical: Not on file    Non-medical: Not on file  Tobacco Use  . Smoking status: Former Smoker    Packs/day: 0.50    Years: 1.00    Pack years: 0.50    Types: Cigarettes    Last attempt to quit: 08/03/2014    Years since quitting: 3.6  . Smokeless tobacco: Never Used  Substance and Sexual Activity  . Alcohol use: No    Alcohol/week: 0.0 standard drinks    Comment: not since pregnancy  . Drug use: Yes    Types: Marijuana    Comment: 1 week asgo  . Sexual activity: Not on file   Lifestyle  . Physical activity:    Days per week: Not on file    Minutes per session: Not on file  . Stress: Not on file  Relationships  . Social connections:    Talks on phone: Not on file    Gets together: Not on file    Attends religious service: Not on file    Active member of club or organization: Not on file    Attends meetings of clubs or organizations: Not on file    Relationship status: Not on file  Other Topics Concern  . Not on file  Social History Narrative   Tamaria lives with her parents and two brothers in a house, all of which are smokers. She reports a good relationship with her family members and feels it is a safe place. She plans to continue living there with her infant post-partum.    Family History  Problem Relation Age of Onset  . Hypertension Paternal Grandmother   . Cancer Paternal Grandmother        breast  . Diabetes Paternal Grandmother   . Hypertension Mother   . Hypertension Father   . Diabetes Maternal Aunt    Past Surgical History:  Procedure Laterality Date  . NO PAST SURGERIES        Mardella Layman, MD 04/12/18 [phone]

## 2018-04-11 NOTE — Discharge Instructions (Addendum)
If you are not improving, you may benefit from follow up with an orthopaedist or sports medicine specialist.

## 2018-04-11 NOTE — ED Notes (Signed)
Patient changing into gown 

## 2018-06-28 ENCOUNTER — Emergency Department (HOSPITAL_COMMUNITY): Payer: Medicaid Other

## 2018-06-28 ENCOUNTER — Emergency Department (HOSPITAL_COMMUNITY)
Admission: EM | Admit: 2018-06-28 | Discharge: 2018-06-28 | Disposition: A | Discharge status: Home / Self Care | Payer: Medicaid Other | Attending: Emergency Medicine | Admitting: Emergency Medicine

## 2018-06-28 DIAGNOSIS — W228XXA Striking against or struck by other objects, initial encounter: Secondary | ICD-10-CM | POA: Insufficient documentation

## 2018-06-28 DIAGNOSIS — Y999 Unspecified external cause status: Secondary | ICD-10-CM | POA: Insufficient documentation

## 2018-06-28 DIAGNOSIS — S0990XA Unspecified injury of head, initial encounter: Secondary | ICD-10-CM

## 2018-06-28 DIAGNOSIS — Z79899 Other long term (current) drug therapy: Secondary | ICD-10-CM | POA: Diagnosis not present

## 2018-06-28 DIAGNOSIS — Y929 Unspecified place or not applicable: Secondary | ICD-10-CM | POA: Insufficient documentation

## 2018-06-28 DIAGNOSIS — Y939 Activity, unspecified: Secondary | ICD-10-CM | POA: Insufficient documentation

## 2018-06-28 DIAGNOSIS — Z87891 Personal history of nicotine dependence: Secondary | ICD-10-CM | POA: Diagnosis not present

## 2018-06-28 MED ORDER — PROCHLORPERAZINE MALEATE 5 MG PO TABS
10.0000 mg | ORAL_TABLET | Freq: Once | ORAL | Status: AC
  Administered 2018-06-28: 10 mg via ORAL
  Filled 2018-06-28: qty 2

## 2018-06-28 MED ORDER — DIPHENHYDRAMINE HCL 25 MG PO CAPS
25.0000 mg | ORAL_CAPSULE | Freq: Once | ORAL | Status: AC
  Administered 2018-06-28: 25 mg via ORAL
  Filled 2018-06-28: qty 1
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
## 2018-06-28 NOTE — Discharge Instructions (Signed)
You were examined today for a head injury and possible concussion.  Your head CT showed no evidence of  Injury today.   Sometimes serious problems can develop after a head injury. Please return to the emergency department if you experience any of the following symptoms: Repeated vomiting Headache that gets worse and does not go away Loss of consciousness or inability to stay awake at times when you   normally would be able to Getting more confused, restless or agitated Convulsions or seizures Difficulty walking or feeling off balance Weakness or numbness Vision changes A concussion is a very mild traumatic brain injury caused by a bump, jolt or blow to the head, most people recover quickly and fully. You can experience a wide variety of symptoms including:   - Confusion      - Difficulty concentrating       - Trouble remembering new info  - Headache      - Dizziness        - Fuzzy or blurry vision  - Fatigue      - Balance problems      - Light sensitivity  - Mood swings     - Changes in sleep or difficulty sleeping   To help these symptoms improve make sure you are getting plenty of rest, avoid screen time, loud music and strenuous mental activities. Avoid any strenuous physical activities, once your symptoms have resolved a slow and gradual return to activity is recommended. It is very important that you avoid situations in which you might sustain a second head injury as this can be very dangerous and life threatening. You cannot be medically cleared to return to normal activities until you have followed up with your primary doctor or a concussion specialist for reevaluation.  
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
## 2018-06-28 NOTE — ED Notes (Signed)
Patient verbalizes understanding of discharge instructions. Opportunity for questioning and answers were provided. Armband removed by staff, pt discharged from ED. Pt ambulatory to lobby.  

## 2018-09-23 ENCOUNTER — Emergency Department (HOSPITAL_COMMUNITY)
Admission: EM | Admit: 2018-09-23 | Discharge: 2018-09-23 | Disposition: A | Discharge status: Home / Self Care | Payer: Medicaid Other | Attending: Emergency Medicine | Admitting: Emergency Medicine

## 2018-09-23 ENCOUNTER — Other Ambulatory Visit: Payer: Self-pay

## 2018-09-23 ENCOUNTER — Encounter (HOSPITAL_COMMUNITY): Payer: Self-pay

## 2018-09-23 DIAGNOSIS — R1110 Vomiting, unspecified: Secondary | ICD-10-CM | POA: Insufficient documentation

## 2018-09-23 DIAGNOSIS — R1111 Vomiting without nausea: Secondary | ICD-10-CM

## 2018-09-23 DIAGNOSIS — Z79899 Other long term (current) drug therapy: Secondary | ICD-10-CM | POA: Diagnosis not present

## 2018-09-23 DIAGNOSIS — Z87891 Personal history of nicotine dependence: Secondary | ICD-10-CM | POA: Insufficient documentation
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
## 2018-09-23 NOTE — ED Provider Notes (Signed)
[facility]-EMERGENCY DEPT Provider Note   CSN: [id number] Arrival date & time: 09/23/18  [id number]    History   Chief Complaint Chief Complaint  Patient presents with  . Emesis  . work note    HPI Veronica Jones is a 28 y.o. female resenting for evaluation of vomiting.  Patient states 3 days ago she had an episode of emesis.  2 days ago, she had a second episode of emesis.  Since then, she has had no further nausea or vomiting.  She denies fevers, chills, chest pain, shortness of breath, abdominal pain, urinary symptoms, abnormal bowel movements.  She denies melena or hematochezia.  Bowel movement was yesterday, was normal.  She has been tolerating p.o. without difficulty yesterday and today.  Patient states she has no medical problems, takes no medications daily.  She smoke cigarettes daily, denies alcohol or drug use.  She is sexually active with one female partner, but they do not use protection.  Her last period was 2 weeks ago.  She has not taken a home pregnancy test, does not want to take but now.     HPI  Past Medical History:  Diagnosis Date  . Chlamydia 2013    Patient Active Problem List   Diagnosis Date Noted  . Postpartum eclampsia 04/19/2015  . NSVD (normal spontaneous vaginal delivery) 04/18/2015  . Polyhydramnios in third trimester 04/16/2015    Past Surgical History:  Procedure Laterality Date  . NO PAST SURGERIES       OB History    Gravida  1   Para  1   Term  1   Preterm      AB      Living  1     SAB      TAB      Ectopic      Multiple  0   Live Births  1            Home Medications    Prior to Admission medications   Medication Sig Start Date End Date Taking? Authorizing Provider  diclofenac (VOLTAREN) 75 MG EC tablet Take 1 tablet (75 mg total) by mouth 2 (two) times daily. 04/11/18   Mardella LaymanHagler, Brian, MD  metroNIDAZOLE (FLAGYL) 500 MG tablet Take 1 tablet (500 mg total) by mouth 2 (two) times daily. Patient  not taking: Reported on 04/11/2018 07/22/17   Montez MoritaLawson, Marie D, CNM    Family History Family History  Problem Relation Age of Onset  . Hypertension Paternal Grandmother   . Cancer Paternal Grandmother        breast  . Diabetes Paternal Grandmother   . Hypertension Mother   . Hypertension Father   . Diabetes Maternal Aunt     Social History Social History   Tobacco Use  . Smoking status: Former Smoker    Packs/day: 0.50    Years: 1.00    Pack years: 0.50    Types: Cigarettes    Last attempt to quit: 08/03/2014    Years since quitting: 4.1  . Smokeless tobacco: Never Used  Substance Use Topics  . Alcohol use: No    Alcohol/week: 0.0 standard drinks    Comment: not since pregnancy  . Drug use: Yes    Types: Marijuana    Comment: 1 week asgo     Allergies   Amoxicillin and Penicillins   Review of Systems Review of Systems  Gastrointestinal: Positive for vomiting (Resolved).  All other systems reviewed and are negative.  Physical Exam Updated Vital Signs BP 120/69 (BP Location: Left Arm)   Pulse 85   Temp 98.9 F (37.2 C) (Oral)   Resp 14   Ht 5\' 4"  (1.626 m)   Wt 72.6 kg   LMP 09/09/2018   SpO2 100%   BMI 27.46 kg/m   Physical Exam Vitals signs and nursing note reviewed.  Constitutional:      General: She is not in acute distress.    Appearance: She is well-developed.     Comments: Sitting comfortably in the bed in no acute distress  HENT:     Head: Normocephalic and atraumatic.  Eyes:     Extraocular Movements: Extraocular movements intact.     Conjunctiva/sclera: Conjunctivae normal.     Pupils: Pupils are equal, round, and reactive to light.  Neck:     Musculoskeletal: Normal range of motion and neck supple.  Cardiovascular:     Rate and Rhythm: Normal rate and regular rhythm.     Pulses: Normal pulses.  Pulmonary:     Effort: Pulmonary effort is normal. No respiratory distress.     Breath sounds: Normal breath sounds. No wheezing.   Abdominal:     General: There is no distension.     Palpations: Abdomen is soft. There is no mass.     Tenderness: There is no abdominal tenderness. There is no guarding or rebound.     Comments: No tenderness palpation the abdomen.  Soft surgery, guarding, distention.  Negative rebound.  Musculoskeletal: Normal range of motion.  Skin:    General: Skin is warm and dry.     Capillary Refill: Capillary refill takes less than 2 seconds.  Neurological:     Mental Status: She is alert and oriented to person, place, and time.      ED Treatments / Results  Labs (all labs ordered are listed, but only abnormal results are displayed) Labs Reviewed - No data to display  EKG None  Radiology No results found.  Procedures Procedures (including critical care time)  Medications Ordered in ED Medications - No data to display   Initial Impression / Assessment and Plan / ED Course  I have reviewed the triage vital signs and the nursing notes.  Pertinent labs & imaging results that were available during my care of the patient were reviewed by me and considered in my medical decision making (see chart for details).        Patient presenting for work note today after having 2 episodes of vomiting several days ago.  Symptoms have completely resolved.  She denies fevers or abdominal pain.  As such, low suspicion for intra-abdominal infection, perforation, obstruction, or surgical abdomen.  I do not believe labs or imaging would be beneficial today.  Patient's last period was 2 weeks ago, she is sexually active and does not use protection.  I offered a pregnancy test, but patient declined.  Patient reports no other symptoms.  At this time, patient appears safe for discharge.  Return precautions given.  Patient states she understands and agrees to plan.   Final Clinical Impressions(s) / ED Diagnoses   Final diagnoses:  Non-intractable vomiting without nausea, unspecified vomiting type    ED  Discharge Orders    None       Alveria Apley, PA-C 09/23/18 1400    Lorre Nick, MD 09/24/18 1103

## 2018-11-02 ENCOUNTER — Other Ambulatory Visit: Payer: Self-pay

## 2018-11-02 ENCOUNTER — Ambulatory Visit (HOSPITAL_COMMUNITY)
Admission: EM | Admit: 2018-11-02 | Discharge: 2018-11-02 | Disposition: A | Discharge status: Home / Self Care | Payer: Medicaid Other | Attending: Internal Medicine | Admitting: Internal Medicine

## 2018-11-02 ENCOUNTER — Encounter (HOSPITAL_COMMUNITY): Payer: Self-pay | Admitting: Emergency Medicine

## 2018-11-02 DIAGNOSIS — N938 Other specified abnormal uterine and vaginal bleeding: Secondary | ICD-10-CM | POA: Diagnosis not present

## 2018-11-02 DIAGNOSIS — Z3202 Encounter for pregnancy test, result negative: Secondary | ICD-10-CM

## 2018-11-02 DIAGNOSIS — N925 Other specified irregular menstruation: Secondary | ICD-10-CM

## 2018-11-02 LAB — POCT PREGNANCY, URINE: Preg Test, Ur: NEGATIVE

## 2018-11-02 NOTE — ED Triage Notes (Signed)
Pt presents to [facility] for assessment she had an episode of spotting yesterday after missing her period 5 days ago.

## 2018-11-02 NOTE — ED Provider Notes (Signed)
[facility]    CSN: [id number] Arrival date & time: 11/02/18  1624     History   Chief Complaint Chief Complaint  Patient presents with  . Possible Pregnancy    HPI Veronica Jones is a 28 y.o. female past medical history comes to urgent care for pregnancy test.  Patient's menstrual cycle is past due for 5 days.  Patient has no nausea or vomiting.  She had some spotting in the past few days but no regular menstrual flow.  No abdominal pain or distention.  No bloating feeling.  Patient has been trying to get pregnant since November.  She has a 12-year-old.  HPI  Past Medical History:  Diagnosis Date  . Chlamydia 2013    Patient Active Problem List   Diagnosis Date Noted  . Postpartum eclampsia 04/19/2015  . NSVD (normal spontaneous vaginal delivery) 04/18/2015  . Polyhydramnios in third trimester 04/16/2015    Past Surgical History:  Procedure Laterality Date  . NO PAST SURGERIES      OB History    Gravida  1   Para  1   Term  1   Preterm      AB      Living  1     SAB      TAB      Ectopic      Multiple  0   Live Births  1            Home Medications    Prior to Admission medications   Medication Sig Start Date End Date Taking? Authorizing Provider  diclofenac (VOLTAREN) 75 MG EC tablet Take 1 tablet (75 mg total) by mouth 2 (two) times daily. 04/11/18   Mardella Layman, MD    Family History Family History  Problem Relation Age of Onset  . Hypertension Paternal Grandmother   . Cancer Paternal Grandmother        breast  . Diabetes Paternal Grandmother   . Hypertension Mother   . Hypertension Father   . Diabetes Maternal Aunt     Social History Social History   Tobacco Use  . Smoking status: Former Smoker    Packs/day: 0.50    Years: 1.00    Pack years: 0.50    Types: Cigarettes    Last attempt to quit: 08/03/2014    Years since quitting: 4.2  . Smokeless tobacco: Never Used  Substance Use Topics  . Alcohol use: No     Alcohol/week: 0.0 standard drinks    Comment: not since pregnancy  . Drug use: Yes    Types: Marijuana    Comment: 1 week asgo     Allergies   Amoxicillin and Penicillins   Review of Systems Review of Systems  Cardiovascular: Negative.   Gastrointestinal: Negative.   Genitourinary: Negative.   Musculoskeletal: Negative.      Physical Exam Triage Vital Signs ED Triage Vitals  Enc Vitals Group     BP 11/02/18 1659 (!) 124/55     Pulse Rate 11/02/18 1659 84     Resp 11/02/18 1659 16     Temp 11/02/18 1659 97.9 F (36.6 C)     Temp Source 11/02/18 1659 Oral     SpO2 11/02/18 1659 100 %     Weight --      Height --      Head Circumference --      Peak Flow --      Pain Score 11/02/18 1701 0  Pain Loc --      Pain Edu? --      Excl. in [place]? --    No data found.  Updated Vital Signs BP (!) 124/55 (BP Location: Left Arm)   Pulse 84   Temp 97.9 F (36.6 C) (Oral)   Resp 16   LMP 09/29/2018   SpO2 100%   Visual Acuity Right Eye Distance:   Left Eye Distance:   Bilateral Distance:    Right Eye Near:   Left Eye Near:    Bilateral Near:     Physical Exam Constitutional:      Appearance: Normal appearance.  Cardiovascular:     Pulses: Normal pulses.     Heart sounds: Normal heart sounds.  Abdominal:     General: Bowel sounds are normal.     Palpations: Abdomen is soft.  Skin:    General: Skin is warm and dry.     Capillary Refill: Capillary refill takes less than 2 seconds.  Neurological:     Mental Status: She is alert.      UC Treatments / Results  Labs (all labs ordered are listed, but only abnormal results are displayed) Labs Reviewed  POC URINE PREG, ED  POCT PREGNANCY, URINE    EKG None  Radiology No results found.  Procedures Procedures (including critical care time)  Medications Ordered in UC Medications - No data to display  Initial Impression / Assessment and Plan / UC Course  I have reviewed the triage vital signs  and the nursing notes.  Pertinent labs & imaging results that were available during my care of the patient were reviewed by me and considered in my medical decision making (see chart for details).     1.  Delayed menstrual period: Urine pregnancy test is negative Patient is encouraged to continue trying to get pregnant. Final Clinical Impressions(s) / UC Diagnoses   Final diagnoses:  Encounter for pregnancy test with result negative   Discharge Instructions   None    ED Prescriptions    None     Controlled Substance Prescriptions [place] Controlled Substance Registry consulted? No   Merrilee JanskyLamptey, Lakira Ogando O, MD 11/02/18 [phone]

## 2018-11-25 ENCOUNTER — Ambulatory Visit (INDEPENDENT_AMBULATORY_CARE_PROVIDER_SITE_OTHER)
Admission: RE | Admit: 2018-11-25 | Discharge: 2018-11-25 | Disposition: A | Discharge status: Home / Self Care | Payer: Medicaid Other | Source: Ambulatory Visit

## 2018-11-25 DIAGNOSIS — M7989 Other specified soft tissue disorders: Secondary | ICD-10-CM

## 2018-11-25 DIAGNOSIS — M79645 Pain in left finger(s): Secondary | ICD-10-CM

## 2018-11-25 DIAGNOSIS — W2209XA Striking against other stationary object, initial encounter: Secondary | ICD-10-CM

## 2018-11-25 MED ORDER — IBUPROFEN 800 MG PO TABS: 800.0000 mg | ORAL_TABLET | Freq: Three times a day (TID) | ORAL | 0 refills | Status: AC

## 2018-11-25 NOTE — ED Provider Notes (Signed)
Virtual Visit via Video Note:  Veronica CareMargo Christen  initiated request for Tele[facility]icine [facility] with [facility]. I connected with Veronica Jones  on 11/25/2018 at 2:09 PM  for a synchronized telemedicine visit using a video enabled HIPPA compliant telemedicine application. I verified that I am speaking with Veronica Jones  using two identifiers.  C , PA-C  was physically located in a [facility] site and Veronica Jones was located at a different location.   The limitations of evaluation and management by telemedicine as well as the availability of in-person appointments were discussed. Patient was informed that she  may incur a bill ( including co-pay) for this virtual visit encounter. Veronica CareMargo Conigliaro  expressed understanding and gave verbal consent to proceed with virtual visit.     History of Present Illness:Veronica Jones  is a 28 y.o. female presents with evaluation of pain and swelling to her left middle finger.  Patient states that approximately 3 months ago while she was in her house she accidentally hit her hand/finger on the stair rail.  Since she has noticed pain and swelling to the base of her finger.  She has had slightly limited range of motion.  Initially thought it was just a sprain/jam, but symptoms have persisted.  She feels as if there is a slight numb sensation at the base of her finger.  Denies previous injury to hand/finger.  Denies radiation into wrist.  She has not taken anything to help with the pain or swelling.  Patient denies overlying erythema.  Past Medical History:  Diagnosis Date  . Chlamydia 2013    Allergies  Allergen Reactions  . Amoxicillin Hives  . Penicillins Hives    Has patient had a PCN reaction causing immediate rash, facial/tongue/throat swelling, SOB or lightheadedness with hypotension: No Has patient had a PCN reaction causing severe rash involving mucus membranes or skin necrosis: No Has patient had a PCN reaction that required  hospitalization No Has patient had a PCN reaction occurring within the last 10 years: No If all of the above answers are "NO", then may proceed with Cephalosporin use.         Observations/Objective: Physical Exam  Constitutional: She is oriented to person, place, and time and well-developed, well-nourished, and in no distress. No distress.  HENT:  Head: Normocephalic and atraumatic.  Neck: Normal range of motion.  Pulmonary/Chest: Effort normal. No respiratory distress.  Speaking in full sentences  Musculoskeletal:     Comments: Moving all extremities appropriately Mild swelling noted to base of middle finger, no notable erythema, patient does not fully flex at metacarpal/phalangeal joint  Neurological: She is alert and oriented to person, place, and time.  symmetric     Assessment and Plan: Unable to fully assess limitations of metacarpal/phalangeal joint.  Given mechanism of injury, do not suspect fracture, will do trial of anti-inflammatories, ibuprofen and Tylenol together, advised given persistent of symptoms and reported swelling if not resolving with consistent anti-inflammatory use to follow-up in person.  May benefit from x-rays, occupational therapy.  Symptoms seem more inflammatory at this time, not suggestive of infectious cause of swelling and pain.  Follow Up Instructions:    I discussed the assessment and treatment plan with the patient. The patient was provided an opportunity to ask questions and all were answered. The patient agreed with the plan and demonstrated an understanding of the instructions.   The patient was advised to call back or seek an in-person evaluation if the symptoms  worsen or if the condition fails to improve as anticipated.     Veronica Lima, PA-C  11/25/2018 2:09 PM         Veronica Jones C, PA-C 11/25/18 1506

## 2018-11-25 NOTE — Discharge Instructions (Signed)
Use anti-inflammatories for pain/swelling. You may take up to 800 mg Ibuprofen every 8 hours with food. You may supplement Ibuprofen with Tylenol [phone] mg every 8 hours.   Ice  Follow up in person if not improving with consistent use of the above

## 2019-04-02 ENCOUNTER — Ambulatory Visit (HOSPITAL_COMMUNITY)
Admission: EM | Admit: 2019-04-02 | Discharge: 2019-04-02 | Disposition: A | Discharge status: Home / Self Care | Payer: Medicaid Other | Attending: Family Medicine | Admitting: Family Medicine

## 2019-04-02 ENCOUNTER — Other Ambulatory Visit: Payer: Self-pay

## 2019-04-02 ENCOUNTER — Ambulatory Visit (INDEPENDENT_AMBULATORY_CARE_PROVIDER_SITE_OTHER): Payer: Medicaid Other

## 2019-04-02 ENCOUNTER — Encounter (HOSPITAL_COMMUNITY): Payer: Self-pay | Admitting: Family Medicine

## 2019-04-02 DIAGNOSIS — M79645 Pain in left finger(s): Secondary | ICD-10-CM | POA: Diagnosis not present

## 2019-04-02 NOTE — ED Triage Notes (Signed)
Patient reports jammed left middle finger approx 7 months ago.  Patient is still having issues.  Finger is weak per patient and unable to make a fist with this finger

## 2019-04-02 NOTE — ED Provider Notes (Signed)
[place]    CSN: [id number] Arrival date & time: 04/02/19  5277      History   Chief Complaint Chief Complaint  Patient presents with  . Appointment    9:50  . Finger Injury    HPI Veronica Jones is a 28 y.o. female.   Pt is a 28 year old female that presents with intermittent left middle finger pain. This has been present for over 7 months. The initial injury was a fall jamming the middle finger. The pain in the finger worsens with the grips or carries something with that finger.  Good ROM of the finger. Woke her up in pain this am when she hit it. No swelling, deformities. No numbness, tingling or radiation of pain.   ROS per HPI       Past Medical History:  Diagnosis Date  . Chlamydia 2013    Patient Active Problem List   Diagnosis Date Noted  . Postpartum eclampsia 04/19/2015  . NSVD (normal spontaneous vaginal delivery) 04/18/2015  . Polyhydramnios in third trimester 04/16/2015    Past Surgical History:  Procedure Laterality Date  . NO PAST SURGERIES      OB History    Gravida  1   Para  1   Term  1   Preterm      AB      Living  1     SAB      TAB      Ectopic      Multiple  0   Live Births  1            Home Medications    Prior to Admission medications   Not on File    Family History Family History  Problem Relation Age of Onset  . Hypertension Paternal Grandmother   . Cancer Paternal Grandmother        breast  . Diabetes Paternal Grandmother   . Hypertension Mother   . Hypertension Father   . Diabetes Maternal Aunt     Social History Social History   Tobacco Use  . Smoking status: Former Smoker    Packs/day: 0.50    Years: 1.00    Pack years: 0.50    Types: Cigarettes    Quit date: 08/03/2014    Years since quitting: 4.6  . Smokeless tobacco: Never Used  Substance Use Topics  . Alcohol use: No    Alcohol/week: 0.0 standard drinks    Comment: not since pregnancy  . Drug use: Yes   Types: Marijuana    Comment: 1 week asgo     Allergies   Amoxicillin and Penicillins   Review of Systems Review of Systems   Physical Exam Triage Vital Signs ED Triage Vitals  Enc Vitals Group     BP 04/02/19 1005 116/77     Pulse Rate 04/02/19 1005 88     Resp 04/02/19 1005 17     Temp 04/02/19 1005 97.7 F (36.5 C)     Temp Source 04/02/19 1005 Tympanic     SpO2 04/02/19 1005 97 %     Weight --      Height --      Head Circumference --      Peak Flow --      Pain Score 04/02/19 1010 7     Pain Loc --      Pain Edu? --      Excl. in [place]? --    No data found.  Updated Vital Signs BP 116/77 (BP Location: Left Arm)   Pulse 88   Temp 97.7 F (36.5 C) (Tympanic)   Resp 17   LMP 03/11/2019   SpO2 97%   Visual Acuity Right Eye Distance:   Left Eye Distance:   Bilateral Distance:    Right Eye Near:   Left Eye Near:    Bilateral Near:     Physical Exam Vitals signs and nursing note reviewed.  Constitutional:      General: She is not in acute distress.    Appearance: Normal appearance. She is not ill-appearing, toxic-appearing or diaphoretic.  HENT:     Head: Normocephalic.     Nose: Nose normal.     Mouth/Throat:     Pharynx: Oropharynx is clear.  Eyes:     Conjunctiva/sclera: Conjunctivae normal.  Neck:     Musculoskeletal: Normal range of motion.  Pulmonary:     Effort: Pulmonary effort is normal.  Musculoskeletal: Normal range of motion.     Left hand: She exhibits tenderness and bony tenderness. She exhibits normal range of motion, normal capillary refill, no deformity, no laceration and no swelling. Normal sensation noted. Normal strength noted.       Hands:     Comments: Mostly tender at the MCP and PIP  Skin:    General: Skin is warm and dry.     Findings: No rash.  Neurological:     Mental Status: She is alert.  Psychiatric:        Mood and Affect: Mood normal.      UC Treatments / Results  Labs (all labs ordered are listed, but  only abnormal results are displayed) Labs Reviewed - No data to display  EKG   Radiology Dg Hand Complete Left  Result Date: 04/02/2019 CLINICAL DATA:  Left hand injury. EXAM: LEFT HAND - COMPLETE 3+ VIEW COMPARISON:  No recent. FINDINGS: No acute bony or joint abnormality identified. No evidence of fracture or dislocation. No radiopaque foreign body. IMPRESSION: No acute abnormality. Electronically Signed   By: Maisie Fus  Register   On: 04/02/2019 10:31    Procedures Procedures (including critical care time)  Medications Ordered in UC Medications - No data to display  Initial Impression / Assessment and Plan / UC Course  I have reviewed the triage vital signs and the nursing notes.  Pertinent labs & imaging results that were available during my care of the patient were reviewed by me and considered in my medical decision making (see chart for details).     X-ray negative for any acute abnormalities.  We will send her to hand specialist for further evaluation. Ibuprofen for pain as needed Final Clinical Impressions(s) / UC Diagnoses   Final diagnoses:  Finger pain, left     Discharge Instructions     Your x-ray was normal.  This could be due to some sort of ligament or tendon issue.  You need to follow-up with a hand specialist.  Call them today for follow-up.    ED Prescriptions    None     PDMP not reviewed this encounter.   Dahlia Byes A, NP 04/02/19 1100

## 2019-04-02 NOTE — Discharge Instructions (Addendum)
Your x-ray was normal.  This could be due to some sort of ligament or tendon issue.  You need to follow-up with a hand specialist.  Call them today for follow-up.

## 2019-06-11 ENCOUNTER — Encounter (HOSPITAL_COMMUNITY): Payer: Self-pay | Admitting: Emergency Medicine

## 2019-06-11 ENCOUNTER — Ambulatory Visit (HOSPITAL_COMMUNITY)
Admission: EM | Admit: 2019-06-11 | Discharge: 2019-06-11 | Disposition: A | Discharge status: Home / Self Care | Payer: Medicaid Other | Attending: Urgent Care | Admitting: Urgent Care

## 2019-06-11 ENCOUNTER — Other Ambulatory Visit: Payer: Self-pay

## 2019-06-11 DIAGNOSIS — N760 Acute vaginitis: Secondary | ICD-10-CM | POA: Insufficient documentation

## 2019-06-11 DIAGNOSIS — Z803 Family history of malignant neoplasm of breast: Secondary | ICD-10-CM | POA: Insufficient documentation

## 2019-06-11 DIAGNOSIS — Z3202 Encounter for pregnancy test, result negative: Secondary | ICD-10-CM | POA: Diagnosis not present

## 2019-06-11 DIAGNOSIS — Z880 Allergy status to penicillin: Secondary | ICD-10-CM | POA: Diagnosis not present

## 2019-06-11 DIAGNOSIS — Z8249 Family history of ischemic heart disease and other diseases of the circulatory system: Secondary | ICD-10-CM | POA: Diagnosis not present

## 2019-06-11 DIAGNOSIS — R1030 Lower abdominal pain, unspecified: Secondary | ICD-10-CM | POA: Diagnosis not present

## 2019-06-11 DIAGNOSIS — R102 Pelvic and perineal pain: Secondary | ICD-10-CM | POA: Insufficient documentation

## 2019-06-11 DIAGNOSIS — R197 Diarrhea, unspecified: Secondary | ICD-10-CM | POA: Insufficient documentation

## 2019-06-11 DIAGNOSIS — Z833 Family history of diabetes mellitus: Secondary | ICD-10-CM | POA: Insufficient documentation

## 2019-06-11 DIAGNOSIS — A5901 Trichomonal vulvovaginitis: Secondary | ICD-10-CM | POA: Diagnosis not present

## 2019-06-11 DIAGNOSIS — Z87891 Personal history of nicotine dependence: Secondary | ICD-10-CM | POA: Diagnosis not present

## 2019-06-11 DIAGNOSIS — R112 Nausea with vomiting, unspecified: Secondary | ICD-10-CM | POA: Insufficient documentation

## 2019-06-11 DIAGNOSIS — A749 Chlamydial infection, unspecified: Secondary | ICD-10-CM | POA: Insufficient documentation

## 2019-06-11 DIAGNOSIS — Z20828 Contact with and (suspected) exposure to other viral communicable diseases: Secondary | ICD-10-CM | POA: Insufficient documentation

## 2019-06-11 DIAGNOSIS — N3091 Cystitis, unspecified with hematuria: Secondary | ICD-10-CM | POA: Insufficient documentation

## 2019-06-11 DIAGNOSIS — B373 Candidiasis of vulva and vagina: Secondary | ICD-10-CM | POA: Insufficient documentation

## 2019-06-11 LAB — POCT URINALYSIS DIP (DEVICE)
Bilirubin Urine: NEGATIVE
Glucose, UA: NEGATIVE mg/dL
Ketones, ur: NEGATIVE mg/dL
Nitrite: POSITIVE — AB
Protein, ur: NEGATIVE mg/dL
Specific Gravity, Urine: >=1.03 (ref 1.005–1.030)
Urobilinogen, UA: 0.2 mg/dL (ref 0.0–1.0)
pH: 6 (ref 5.0–8.0)

## 2019-06-11 LAB — POCT PREGNANCY, URINE: Preg Test, Ur: NEGATIVE

## 2019-06-11 LAB — POC URINE PREG, ED
Preg Test, Ur: NEGATIVE
Preg Test, Ur: NEGATIVE

## 2019-06-11 MED ORDER — SULFAMETHOXAZOLE-TRIMETHOPRIM 800-160 MG PO TABS: 1.0000 | ORAL_TABLET | Freq: Two times a day (BID) | ORAL | 0 refills | Status: DC

## 2019-06-11 MED ORDER — ONDANSETRON 8 MG PO TBDP: 8.0000 mg | ORAL_TABLET | Freq: Three times a day (TID) | ORAL | 0 refills | Status: DC | PRN

## 2019-06-11 NOTE — ED Provider Notes (Signed)
[place]   MRN: [id number] DOB: February 13, 1991  Subjective:   Leverne Tessler is a 28 y.o. female presenting for acute onset of nausea with vomiting and diarrhea this morning. Patient states she also has had mostly constant, moderate-severe lower belly/pelvic pains. Has not tried medications for relief. She would like to get tested for COVID, pregnancy and STI. LMP was 05/23/2019 and was regular.   Denies taking chronic medications.    Allergies  Allergen Reactions  . Amoxicillin Hives  . Penicillins Hives    Has patient had a PCN reaction causing immediate rash, facial/tongue/throat swelling, SOB or lightheadedness with hypotension: No Has patient had a PCN reaction causing severe rash involving mucus membranes or skin necrosis: No Has patient had a PCN reaction that required hospitalization No Has patient had a PCN reaction occurring within the last 10 years: No If all of the above answers are "NO", then may proceed with Cephalosporin use.     Past Medical History:  Diagnosis Date  . Chlamydia 2013     Past Surgical History:  Procedure Laterality Date  . NO PAST SURGERIES      Family History  Problem Relation Age of Onset  . Hypertension Paternal Grandmother   . Cancer Paternal Grandmother        breast  . Diabetes Paternal Grandmother   . Hypertension Mother   . Hypertension Father   . Diabetes Maternal Aunt     Social History   Tobacco Use  . Smoking status: Former Smoker    Packs/day: 0.50    Years: 1.00    Pack years: 0.50    Types: Cigarettes    Quit date: 08/03/2014    Years since quitting: 4.8  . Smokeless tobacco: Never Used  Substance Use Topics  . Alcohol use: No    Alcohol/week: 0.0 standard drinks    Comment: not since pregnancy  . Drug use: Yes    Types: Marijuana    Comment: 1 week asgo    Review of Systems  Constitutional: Negative for fever and malaise/fatigue.  HENT: Negative for congestion, ear pain, sinus pain and sore  throat.   Eyes: Negative for discharge and redness.  Respiratory: Negative for cough, hemoptysis, shortness of breath and wheezing.   Cardiovascular: Negative for chest pain.  Gastrointestinal: Positive for abdominal pain, diarrhea, nausea and vomiting. Negative for blood in stool and constipation.  Genitourinary: Negative for dysuria, flank pain, frequency, hematuria and urgency.  Musculoskeletal: Negative for myalgias.  Skin: Negative for rash.  Neurological: Negative for dizziness, weakness and headaches.  Psychiatric/Behavioral: Negative for depression and substance abuse.     Objective:   Vitals: BP (!) 109/51   Pulse 96   Temp 98.8 F (37.1 C) (Oral)   Resp 16   LMP 05/23/2019   SpO2 100%   Physical Exam Constitutional:      General: She is not in acute distress.    Appearance: Normal appearance. She is well-developed and normal weight. She is not ill-appearing, toxic-appearing or diaphoretic.  HENT:     Head: Normocephalic and atraumatic.     Right Ear: External ear normal.     Left Ear: External ear normal.     Nose: Nose normal.     Mouth/Throat:     Mouth: Mucous membranes are moist.     Pharynx: Oropharynx is clear.  Eyes:     General: No scleral icterus.    Extraocular Movements: Extraocular movements intact.     Pupils: Pupils are equal,  round, and reactive to light.  Cardiovascular:     Rate and Rhythm: Normal rate and regular rhythm.     Heart sounds: Normal heart sounds. No murmur. No friction rub. No gallop.   Pulmonary:     Effort: Pulmonary effort is normal. No respiratory distress.     Breath sounds: Normal breath sounds. No stridor. No wheezing, rhonchi or rales.  Abdominal:     General: Bowel sounds are normal. There is no distension.     Palpations: Abdomen is soft. There is no mass.     Tenderness: There is abdominal tenderness in the right lower quadrant, suprapubic area and left lower quadrant. There is no right CVA tenderness, left CVA  tenderness, guarding or rebound.  Skin:    General: Skin is warm and dry.     Coloration: Skin is not pale.     Findings: No rash.  Neurological:     General: No focal deficit present.     Mental Status: She is alert and oriented to person, place, and time.  Psychiatric:        Mood and Affect: Mood normal.        Behavior: Behavior normal.        Thought Content: Thought content normal.        Judgment: Judgment normal.    Results for orders placed or performed during the hospital encounter of 06/11/19 (from the past 24 hour(s))  POCT urinalysis dip (device)     Status: Abnormal   Collection Time: 06/11/19  1:20 PM  Result Value Ref Range   Glucose, UA NEGATIVE NEGATIVE mg/dL   Bilirubin Urine NEGATIVE NEGATIVE   Ketones, ur NEGATIVE NEGATIVE mg/dL   Specific Gravity, Urine >=1.030 1.005 - 1.030   Hgb urine dipstick TRACE (A) NEGATIVE   pH 6.0 5.0 - 8.0   Protein, ur NEGATIVE NEGATIVE mg/dL   Urobilinogen, UA 0.2 0.0 - 1.0 mg/dL   Nitrite POSITIVE (A) NEGATIVE   Leukocytes,Ua SMALL (A) NEGATIVE  POC urine pregnancy     Status: None   Collection Time: 06/11/19  1:27 PM  Result Value Ref Range   Preg Test, Ur NEGATIVE NEGATIVE  Pregnancy, urine POC     Status: None   Collection Time: 06/11/19  1:27 PM  Result Value Ref Range   Preg Test, Ur NEGATIVE NEGATIVE  POC urine preg, ED (not at [facility])     Status: None   Collection Time: 06/11/19  1:27 PM  Result Value Ref Range   Preg Test, Ur NEGATIVE NEGATIVE    Assessment and Plan :   1. Cystitis with hematuria   2. Nausea vomiting and diarrhea   3. Lower abdominal pain   4. Pelvic pain in female     Will cover for cystitis with Bactrim, urine culture pending.  Recommended patient hydrate aggressively.  COVID testing pending. Counseled patient on potential for adverse effects with medications prescribed/recommended today, ER and return-to-clinic precautions discussed, patient verbalized understanding.   Wallis Bamberg, PA-C  06/11/19 1349

## 2019-06-11 NOTE — ED Triage Notes (Signed)
Woke up with abdominal pain, vomiting, diarrhea. This happened once last week, but resolved.

## 2019-06-11 NOTE — Discharge Instructions (Addendum)
Make sure you push fluids drinking mostly water but mix it with Gatorade.  Try to eat light meals including soups, broths and soft foods, fruits.  You may use Zofran for your nausea and vomiting once every 8 hours.  

## 2019-06-13 ENCOUNTER — Telehealth (HOSPITAL_COMMUNITY): Payer: Self-pay | Admitting: Emergency Medicine

## 2019-06-13 LAB — NOVEL CORONAVIRUS, NAA (HOSP ORDER, SEND-OUT TO REF LAB; TAT 18-24 HRS): SARS-CoV-2, NAA: NOT DETECTED

## 2019-06-13 LAB — CERVICOVAGINAL ANCILLARY ONLY
Bacterial vaginitis: POSITIVE — AB
Candida vaginitis: POSITIVE — AB
Chlamydia: POSITIVE — AB
Neisseria Gonorrhea: NEGATIVE
Trichomonas: POSITIVE — AB

## 2019-06-13 LAB — URINE CULTURE: Culture: >=100000 — AB

## 2019-06-13 MED ORDER — METRONIDAZOLE 500 MG PO TABS: 500.0000 mg | ORAL_TABLET | Freq: Two times a day (BID) | ORAL | 0 refills | Status: AC

## 2019-06-13 MED ORDER — AZITHROMYCIN 250 MG PO TABS: 1000.0000 mg | ORAL_TABLET | Freq: Once | ORAL | 0 refills | Status: AC

## 2019-06-13 MED ORDER — FLUCONAZOLE 150 MG PO TABS: 150.0000 mg | ORAL_TABLET | Freq: Once | ORAL | 0 refills | Status: AC

## 2019-06-13 NOTE — Telephone Encounter (Signed)
Bacterial vaginosis is positive. This was not treated at the urgent care visit.  Flagyl 500 mg BID x 7 days #14 no refills sent to patients pharmacy of choice.    Trichomonas is positive. Rx  for Flagyl was sent to the pharmacy of record. Pt needs education to refrain from sexual intercourse for 7 days to give the medicine time to work. Sexual partners need to be notified and tested/treated. Condoms may reduce risk of reinfection. Recheck for further evaluation if symptoms are not improving.   Chlamydia is positive.  Rx po zithromax 1g #1 dose no refills was sent to the pharmacy of record. please refrain from sexual intercourse for 7 days to give the medicine time to work, sexual partners need to be notified and tested/treated.  Condoms may reduce risk of reinfection.  Recheck or followup with PCP for further evaluation if symptoms are not improving.   [facility] notified.  Test for candida (yeast) was positive.  Prescription for fluconazole 150mg  po now, repeat dose in 3d if needed, #2 no refills, sent to the pharmacy of record.  Recheck or followup with PCP for further evaluation if symptoms are not improving.    Patient contacted by phone and made aware of    results. Pt verbalized understanding and had all questions answered.

## 2019-06-22 ENCOUNTER — Telehealth (HOSPITAL_COMMUNITY): Payer: Self-pay | Admitting: *Deleted

## 2019-06-22 MED ORDER — ONDANSETRON 8 MG PO TBDP: 8.0000 mg | ORAL_TABLET | Freq: Three times a day (TID) | ORAL | 0 refills | Status: DC | PRN

## 2019-06-22 MED ORDER — SULFAMETHOXAZOLE-TRIMETHOPRIM 800-160 MG PO TABS: 1.0000 | ORAL_TABLET | Freq: Two times a day (BID) | ORAL | 0 refills | Status: DC

## 2019-06-22 NOTE — Telephone Encounter (Signed)
Pt states was waiting for Medicaid to come through, so hasn't been able to fill 2 Rxs sent in yet; requesting pharmacy change to [facility] on [address].  States no change in sxs.

## 2019-06-26 ENCOUNTER — Telehealth (HOSPITAL_COMMUNITY): Payer: Self-pay

## 2019-06-26 MED ORDER — METRONIDAZOLE 500 MG PO TABS: 500.0000 mg | ORAL_TABLET | Freq: Two times a day (BID) | ORAL | 0 refills | Status: DC

## 2019-06-26 MED ORDER — AZITHROMYCIN 250 MG PO TABS: 1000.0000 mg | ORAL_TABLET | Freq: Once | ORAL | 0 refills | Status: AC

## 2019-06-26 MED ORDER — FLUCONAZOLE 150 MG PO TABS: 150.0000 mg | ORAL_TABLET | Freq: Every day | ORAL | 0 refills | Status: AC

## 2019-10-19 ENCOUNTER — Ambulatory Visit (INDEPENDENT_AMBULATORY_CARE_PROVIDER_SITE_OTHER)
Admission: RE | Admit: 2019-10-19 | Discharge: 2019-10-19 | Disposition: A | Discharge status: Home / Self Care | Payer: Medicaid Other | Source: Ambulatory Visit

## 2019-10-19 DIAGNOSIS — R112 Nausea with vomiting, unspecified: Secondary | ICD-10-CM | POA: Diagnosis not present

## 2019-10-19 DIAGNOSIS — R109 Unspecified abdominal pain: Secondary | ICD-10-CM

## 2019-10-19 DIAGNOSIS — R197 Diarrhea, unspecified: Secondary | ICD-10-CM | POA: Diagnosis not present

## 2019-10-19 DIAGNOSIS — A084 Viral intestinal infection, unspecified: Secondary | ICD-10-CM

## 2019-10-19 MED ORDER — LOPERAMIDE HCL 2 MG PO CAPS: 2.0000 mg | ORAL_CAPSULE | Freq: Every day | ORAL | 0 refills | Status: DC | PRN

## 2019-10-19 MED ORDER — ONDANSETRON 8 MG PO TBDP: 8.0000 mg | ORAL_TABLET | Freq: Three times a day (TID) | ORAL | 0 refills | Status: DC | PRN

## 2019-10-19 NOTE — Discharge Instructions (Signed)

## 2019-10-19 NOTE — ED Provider Notes (Signed)
Virtual Visit via Video Note:  [name]  initiated request for Telemedicine visit with [facility] team. I connected with Beverly Gust  on 10/19/2019 at 1:33 PM  for a synchronized telemedicine visit using a video enabled HIPPA compliant telemedicine application. I verified that I am speaking with Beverly Gust  using two identifiers. Jaynee Eagles, PA-C  was physically located in a [facility] Urgent care site and Jyll Tomaro was located at a different location.   The limitations of evaluation and management by telemedicine as well as the availability of in-person appointments were discussed. Patient was informed that she  may incur a bill ( including co-pay) for this virtual visit encounter. Airica Schwartzkopf  expressed understanding and gave verbal consent to proceed with virtual visit.    History of Present Illness:Veronica Jones  is a 29 y.o. female presents with acute onset this morning of stomach cramps, diarrhea, vomiting. Stomach cramp is around her belly button and lower abdomen, mild pain. Ate Cookout last night and thinks she is having hard time from this.  Also has 2 co-workers with similar symptoms. Has tried [employer]. Has not had COVID 19 vaccination. Takes benadryl prn for allergies. Denies recent hospitalizations, antibiotic use.    Review of Systems  Constitutional: Negative for chills, fever and malaise/fatigue.  Respiratory: Positive for cough (has allergies, cough is not new and has it with allergies). Negative for shortness of breath.   Cardiovascular: Negative for chest pain.  Gastrointestinal: Positive for abdominal pain, diarrhea, nausea and vomiting. Negative for constipation.  Genitourinary: Negative for dysuria, flank pain, frequency, hematuria and urgency.  Musculoskeletal: Negative for back pain and myalgias.  Skin: Negative for rash.  Neurological: Negative for dizziness and headaches.  Psychiatric/Behavioral: Negative for substance abuse.    Denies  taking chronic medications.     Allergies  Allergen Reactions  . Amoxicillin Hives  . Penicillins Hives    Has patient had a PCN reaction causing immediate rash, facial/tongue/throat swelling, SOB or lightheadedness with hypotension: No Has patient had a PCN reaction causing severe rash involving mucus membranes or skin necrosis: No Has patient had a PCN reaction that required hospitalization No Has patient had a PCN reaction occurring within the last 10 years: No If all of the above answers are "NO", then may proceed with Cephalosporin use.      Past Medical History:  Diagnosis Date  . Chlamydia 2013    Past Surgical History:  Procedure Laterality Date  . NO PAST SURGERIES        Observations/Objective: Physical Exam Constitutional:      General: She is not in acute distress.    Appearance: Normal appearance. She is well-developed. She is not ill-appearing, toxic-appearing or diaphoretic.  Eyes:     Extraocular Movements: Extraocular movements intact.  Pulmonary:     Effort: Pulmonary effort is normal.  Neurological:     General: No focal deficit present.     Mental Status: She is alert and oriented to person, place, and time.  Psychiatric:        Mood and Affect: Mood normal.        Behavior: Behavior normal.        Thought Content: Thought content normal.        Judgment: Judgment normal.      Assessment and Plan:  PDMP not reviewed this encounter.  1. Nausea, vomiting, and diarrhea   2. Stomach cramps   3. Viral gastroenteritis    Will manage for  suspected viral gastroenteritis with supportive care.  Recommended patient hydrate well, eat light meals and maintain electrolytes.  Will use Zofran and Imodium for nausea, vomiting and diarrhea. Counseled patient on potential for adverse effects with medications prescribed/recommended today, ER and return-to-clinic precautions discussed, patient verbalized understanding.    Follow Up Instructions:    I  discussed the assessment and treatment plan with the patient. The patient was provided an opportunity to ask questions and all were answered. The patient agreed with the plan and demonstrated an understanding of the instructions.   The patient was advised to call back or seek an in-person evaluation if the symptoms worsen or if the condition fails to improve as anticipated.  I provided 10 minutes of non-face-to-face time during this encounter.    Wallis Bamberg, PA-C  10/19/2019 1:33 PM         Wallis Bamberg, PA-C 10/19/19 1338

## 2020-02-26 ENCOUNTER — Inpatient Hospital Stay (HOSPITAL_COMMUNITY)
Admission: AD | Admit: 2020-02-26 | Discharge: 2020-02-27 | Disposition: A | Discharge status: Home / Self Care | Payer: Medicaid Other | Attending: Obstetrics and Gynecology | Admitting: Obstetrics and Gynecology

## 2020-02-26 ENCOUNTER — Other Ambulatory Visit: Payer: Self-pay

## 2020-02-26 DIAGNOSIS — Z87891 Personal history of nicotine dependence: Secondary | ICD-10-CM | POA: Insufficient documentation

## 2020-02-26 DIAGNOSIS — O26851 Spotting complicating pregnancy, first trimester: Secondary | ICD-10-CM | POA: Insufficient documentation

## 2020-02-26 DIAGNOSIS — O3680X0 Pregnancy with inconclusive fetal viability, not applicable or unspecified: Secondary | ICD-10-CM | POA: Insufficient documentation

## 2020-02-26 DIAGNOSIS — R102 Pelvic and perineal pain: Secondary | ICD-10-CM

## 2020-02-26 DIAGNOSIS — O209 Hemorrhage in early pregnancy, unspecified: Secondary | ICD-10-CM

## 2020-02-26 DIAGNOSIS — Z3A01 Less than 8 weeks gestation of pregnancy: Secondary | ICD-10-CM | POA: Insufficient documentation

## 2020-02-27 ENCOUNTER — Encounter (HOSPITAL_COMMUNITY): Payer: Self-pay | Admitting: *Deleted

## 2020-02-27 ENCOUNTER — Inpatient Hospital Stay (HOSPITAL_COMMUNITY): Payer: Medicaid Other

## 2020-02-27 DIAGNOSIS — O3680X0 Pregnancy with inconclusive fetal viability, not applicable or unspecified: Secondary | ICD-10-CM

## 2020-02-27 DIAGNOSIS — O4691 Antepartum hemorrhage, unspecified, first trimester: Secondary | ICD-10-CM | POA: Diagnosis not present

## 2020-02-27 DIAGNOSIS — O26851 Spotting complicating pregnancy, first trimester: Secondary | ICD-10-CM | POA: Diagnosis present

## 2020-02-27 DIAGNOSIS — Z87891 Personal history of nicotine dependence: Secondary | ICD-10-CM | POA: Diagnosis not present

## 2020-02-27 DIAGNOSIS — Z3A01 Less than 8 weeks gestation of pregnancy: Secondary | ICD-10-CM

## 2020-02-27 LAB — URINALYSIS, ROUTINE W REFLEX MICROSCOPIC
Bilirubin Urine: NEGATIVE
Glucose, UA: NEGATIVE mg/dL
Ketones, ur: 5 mg/dL — AB
Nitrite: NEGATIVE
Protein, ur: NEGATIVE mg/dL
Specific Gravity, Urine: 1.012 (ref 1.005–1.030)
pH: 5 (ref 5.0–8.0)

## 2020-02-27 LAB — COMPREHENSIVE METABOLIC PANEL
ALT: 14 U/L (ref 0–44)
AST: 18 U/L (ref 15–41)
Albumin: 4 g/dL (ref 3.5–5.0)
Alkaline Phosphatase: 32 U/L — ABNORMAL LOW (ref 38–126)
Anion gap: 10 (ref 5–15)
BUN: 8 mg/dL (ref 6–20)
CO2: 22 mmol/L (ref 22–32)
Calcium: 9 mg/dL (ref 8.9–10.3)
Chloride: 107 mmol/L (ref 98–111)
Creatinine, Ser: 0.83 mg/dL (ref 0.44–1.00)
GFR calc Af Amer: >60 mL/min (ref >60)
GFR calc non Af Amer: >60 mL/min (ref >60)
Glucose, Bld: 106 mg/dL — ABNORMAL HIGH (ref 70–99)
Potassium: 4.2 mmol/L (ref 3.5–5.1)
Sodium: 139 mmol/L (ref 135–145)
Total Bilirubin: 1.5 mg/dL — ABNORMAL HIGH (ref 0.3–1.2)
Total Protein: 7.1 g/dL (ref 6.5–8.1)

## 2020-02-27 LAB — CBC
HCT: 34.5 % — ABNORMAL LOW (ref 36.0–46.0)
Hemoglobin: 11.1 g/dL — ABNORMAL LOW (ref 12.0–15.0)
MCH: 26.8 pg (ref 26.0–34.0)
MCHC: 32.2 g/dL (ref 30.0–36.0)
MCV: 83.3 fL (ref 80.0–100.0)
Platelets: 145 10*3/uL — ABNORMAL LOW (ref 150–400)
RBC: 4.14 MIL/uL (ref 3.87–5.11)
RDW: 19.6 % — ABNORMAL HIGH (ref 11.5–15.5)
WBC: 6.1 10*3/uL (ref 4.0–10.5)
nRBC: 0 % (ref 0.0–0.2)

## 2020-02-27 LAB — WET PREP, GENITAL
Sperm: NONE SEEN
Trich, Wet Prep: NONE SEEN
WBC, Wet Prep HPF POC: NONE SEEN
Yeast Wet Prep HPF POC: NONE SEEN

## 2020-02-27 LAB — HCG, QUANTITATIVE, PREGNANCY: hCG, Beta Chain, Quant, S: 59 m[IU]/mL — ABNORMAL HIGH (ref <5)

## 2020-02-27 LAB — HIV ANTIBODY (ROUTINE TESTING W REFLEX): HIV Screen 4th Generation wRfx: NONREACTIVE

## 2020-02-27 NOTE — MAU Provider Note (Signed)
Chief Complaint: No chief complaint on file.   First Provider Initiated Contact with Patient 02/27/20 0103        SUBJECTIVE HPI: Veronica Jones is a 29 y.o. G2P1001 at [redacted]w[redacted]d by LMP who presents to maternity admissions reporting dark red spotting tonight.  Not having any pain.  . She denies vaginal itching/burning, urinary symptoms, h/a, dizziness, n/v, or fever/chills.     Vaginal Bleeding The patient's primary symptoms include vaginal bleeding. The patient's pertinent negatives include no genital itching, genital lesions, genital odor or pelvic pain. This is a new problem. The current episode started today. The problem has been unchanged. The patient is experiencing no pain. Pertinent negatives include no abdominal pain, back pain, chills, constipation, diarrhea, fever, nausea or vomiting. The vaginal discharge was bloody. The vaginal bleeding is spotting. She has not been passing clots. She has not been passing tissue. Nothing aggravates the symptoms. She has tried nothing for the symptoms.   RN Note: Took UPT Sunday and Monday and positive. Spotting tonight that is dark red. No pain. No recent intercourse. LMP 01/25/20  Past Medical History:  Diagnosis Date  . Chlamydia 2013   Past Surgical History:  Procedure Laterality Date  . NO PAST SURGERIES     Social History   Socioeconomic History  . Marital status: Single    Spouse name: Not on file  . Number of children: 0  . Years of education: Not on file  . Highest education level: Not on file  Occupational History  . 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 Smoking status: Former Smoker    Packs/day: 0.50    Years: 1.00    Pack years: 0.50    Types: Cigarettes    Quit date: 08/03/2014    Years since quitting: 5.5  . Smokeless tobacco: Never Used  Vaping Use  . Vaping Use: Never used  Substance and Sexual Activity  . Alcohol use: No    Alcohol/week: 0.0 standard drinks    Comment: not since  pregnancy  . Drug use: Yes    Types: Marijuana    Comment: 1 week asgo  . Sexual activity: Not on file  Other Topics Concern  . Not on file  Social History Narrative   Amylynn lives with her parents and two brothers in a house, all of which are smokers. She reports a good relationship with her family members and feels it is a safe place. She plans to continue living there with her infant post-partum.    Social Determinants of Health   Financial Resource Strain:   . Difficulty of Paying Living Expenses: Not on file  Food Insecurity:   . Worried About Programme researcher, broadcasting/film/video in the Last Year: Not on file  . Ran Out of Food in the Last Year: Not on file  Transportation Needs:   . Lack of Transportation (Medical): Not on file  . Lack of Transportation (Non-Medical): Not on file  Physical Activity:   . Days of Exercise per Week: Not on file  . Minutes of Exercise per Session: Not on file  Stress:   . Feeling of Stress : Not on file  Social Connections:   . Frequency of Communication with Friends and Family: Not on file  . Frequency of Social Gatherings with Friends and Family: Not on file  . Attends Religious Services: Not on file  . Active Member of Clubs or Organizations: Not on file  . Attends  Club or Organization Meetings: Not on file  . Marital Status: Not on file  Intimate Partner Violence:   . Fear of Current or Ex-Partner: Not on file  . Emotionally Abused: Not on file  . Physically Abused: Not on file  . Sexually Abused: Not on file   No current facility-administered medications on file prior to encounter.   Current Outpatient Medications on File Prior to Encounter  Medication Sig Dispense Refill  . loperamide (IMODIUM) 2 MG capsule Take 1 capsule (2 mg total) by mouth daily as needed for diarrhea or loose stools. 10 capsule 0  . ondansetron (ZOFRAN-ODT) 8 MG disintegrating tablet Take 1 tablet (8 mg total) by mouth every 8 (eight) hours as needed for nausea or vomiting. 30  tablet 0   Allergies  Allergen Reactions  . Amoxicillin Hives  . Penicillins Hives    Has patient had a PCN reaction causing immediate rash, facial/tongue/throat swelling, SOB or lightheadedness with hypotension: No Has patient had a PCN reaction causing severe rash involving mucus membranes or skin necrosis: No Has patient had a PCN reaction that required hospitalization No Has patient had a PCN reaction occurring within the last 10 years: No If all of the above answers are "NO", then may proceed with Cephalosporin use.     I have reviewed patient's Past Medical Hx, Surgical Hx, Family Hx, Social Hx, medications and allergies.   ROS:  Review of Systems  Constitutional: Negative for chills and fever.  Gastrointestinal: Negative for abdominal pain, constipation, diarrhea, nausea and vomiting.  Genitourinary: Positive for vaginal bleeding. Negative for pelvic pain.  Musculoskeletal: Negative for back pain.   Review of Systems  Other systems negative   Physical Exam  Physical Exam Patient Vitals for the past 24 hrs:  BP Temp Pulse Resp Height Weight  02/27/20 0043 122/80 -- 85 -- -- --  02/27/20 0040 -- 98.6 F (37 C) -- 16 5\' 4"  (1.626 m) 68.5 kg   Constitutional: Well-developed, well-nourished female in no acute distress.  Cardiovascular: normal rate Respiratory: normal effort GI: Abd soft, non-tender.  MS: Extremities nontender, no edema, normal ROM Neurologic: Alert and oriented x 4.  GU: Neg CVAT.  PELVIC EXAM: Cervix pink, visually closed, without lesion, scant red discharge, vaginal walls and external genitalia normal Bimanual exam: Cervix 0/long/high, firm, anterior, neg CMT, uterus nontender, nonenlarged, adnexa without tenderness, enlargement, or mass   LAB RESULTS    Results for orders placed or performed during the hospital encounter of 02/26/20 (from the past 24 hour(s))  Urinalysis, Routine w reflex microscopic     Status: Abnormal   Collection Time:  02/27/20 12:50 AM  Result Value Ref Range   Color, Urine YELLOW YELLOW   APPearance CLOUDY (A) CLEAR   Specific Gravity, Urine 1.012 1.005 - 1.030   pH 5.0 5.0 - 8.0   Glucose, UA NEGATIVE NEGATIVE mg/dL   Hgb urine dipstick LARGE (A) NEGATIVE   Bilirubin Urine NEGATIVE NEGATIVE   Ketones, ur 5 (A) NEGATIVE mg/dL   Protein, ur NEGATIVE NEGATIVE mg/dL   Nitrite NEGATIVE NEGATIVE   Leukocytes,Ua MODERATE (A) NEGATIVE   RBC / HPF 6-10 0 - 5 RBC/hpf   WBC, UA 21-50 0 - 5 WBC/hpf   Bacteria, UA RARE (A) NONE SEEN   Squamous Epithelial / LPF 11-20 0 - 5   Mucus PRESENT    Hyaline Casts, UA PRESENT   HIV Antibody (routine testing w rflx)     Status: None   Collection Time: 02/27/20  1:09 AM  Result Value Ref Range   HIV Screen 4th Generation wRfx Non Reactive Non Reactive  CBC     Status: Abnormal   Collection Time: 02/27/20  1:09 AM  Result Value Ref Range   WBC 6.1 4.0 - 10.5 K/uL   RBC 4.14 3.87 - 5.11 MIL/uL   Hemoglobin 11.1 (L) 12.0 - 15.0 g/dL   HCT 09.4 (L) 36 - 46 %   MCV 83.3 80.0 - 100.0 fL   MCH 26.8 26.0 - 34.0 pg   MCHC 32.2 30.0 - 36.0 g/dL   RDW 70.9 (H) 62.8 - 36.6 %   Platelets 145 (L) 150 - 400 K/uL   nRBC 0.0 0.0 - 0.2 %  hCG, quantitative, pregnancy     Status: Abnormal   Collection Time: 02/27/20  1:09 AM  Result Value Ref Range   hCG, Beta Chain, Quant, S 59 (H) <5 mIU/mL  Comprehensive metabolic panel     Status: Abnormal   Collection Time: 02/27/20  1:09 AM  Result Value Ref Range   Sodium 139 135 - 145 mmol/L   Potassium 4.2 3.5 - 5.1 mmol/L   Chloride 107 98 - 111 mmol/L   CO2 22 22 - 32 mmol/L   Glucose, Bld 106 (H) 70 - 99 mg/dL   BUN 8 6 - 20 mg/dL   Creatinine, Ser 2.94 0.44 - 1.00 mg/dL   Calcium 9.0 8.9 - 76.5 mg/dL   Total Protein 7.1 6.5 - 8.1 g/dL   Albumin 4.0 3.5 - 5.0 g/dL   AST 18 15 - 41 U/L   ALT 14 0 - 44 U/L   Alkaline Phosphatase 32 (L) 38 - 126 U/L   Total Bilirubin 1.5 (H) 0.3 - 1.2 mg/dL   GFR calc non Af Amer >60 >60  mL/min   GFR calc Af Amer >60 >60 mL/min   Anion gap 10 5 - 15  Wet prep, genital     Status: Abnormal   Collection Time: 02/27/20  1:17 AM  Result Value Ref Range   Yeast Wet Prep HPF POC NONE SEEN NONE SEEN   Trich, Wet Prep NONE SEEN NONE SEEN   Clue Cells Wet Prep HPF POC PRESENT (A) NONE SEEN   WBC, Wet Prep HPF POC NONE SEEN NONE SEEN   Sperm NONE SEEN      IMAGING US OB LESS THAN 14 WEEKS WITH OB TRANSVAGINAL  Result Date: 02/27/2020 CLINICAL DATA:  Vaginal spotting tonight EXAM: OBSTETRIC <14 WK ULTRASOUND TECHNIQUE: Transabdominal ultrasound was performed for evaluation of the gestation as well as the maternal uterus and adnexal regions. COMPARISON:  None. FINDINGS: Intrauterine gestational sac: None Yolk sac:  Not Visualized. Embryo:  Not Visualized. Cardiac Activity: Not Visualized. Maternal uterus/adnexae: Right ovary measures 2.1 x 3.4 x 2.4 cm and the left ovary measures 1.6 x 2.9 x 1.6 cm. Uterus is anteverted. Endometrium measures 10 mm in thickness. There is trace pelvic free fluid. IMPRESSION: 1. No evidence of intrauterine pregnancy at this time. Please correlate with quantitative beta HCG levels. Serial beta HCG measurements and follow-up ultrasound may be needed to document intrauterine pregnancy. 2. Trace pelvic free fluid, likely physiologic. 3. Unremarkable ovaries.  No evidence of adnexal mass. Electronically Signed   By: Sharlet Salina M.D.   On: 02/27/2020 01:56     MAU Management/MDM: Ordered usual first trimester r/o ectopic labs.   Pelvic exam and cultures done Will check baseline Ultrasound to rule out ectopic.  This bleeding/pain can represent  a normal pregnancy with bleeding, spontaneous abortion or even an ectopic which can be life-threatening.  The process as listed above helps to determine which of these is present.  Discussed findings  Plan to repeat HCG in 48 hours  Discussed this may reflect a miscarriage since levels are so low  ASSESSMENT 1.  Pregnancy of unknown anatomic location   2. Vaginal bleeding in pregnancy, first trimester   3. Pregnancy of unknown anatomic location   4. Vaginal bleeding in pregnancy, first trimester     PLAN Discharge home Plan to repeat HCG level in 48 hours in clinic  Will repeat  Ultrasound in about 7-10 days if HCG levels double appropriately  Ectopic precautions   Pt stable at time of discharge. Encouraged to return here or to other Urgent Care/ED if she develops worsening of symptoms, increase in pain, fever, or other concerning symptoms.    Wynelle Bourgeois CNM, MSN Certified Nurse-Midwife 02/27/2020  1:03 AM

## 2020-02-27 NOTE — MAU Note (Addendum)
Took UPT Sunday and Monday and positive. Spotting tonight that is dark red. No pain. No recent intercourse. LMP 01/25/20

## 2020-02-27 NOTE — Discharge Instructions (Signed)

## 2020-02-28 ENCOUNTER — Encounter: Payer: Self-pay | Admitting: *Deleted

## 2020-02-28 ENCOUNTER — Ambulatory Visit (INDEPENDENT_AMBULATORY_CARE_PROVIDER_SITE_OTHER): Payer: Medicaid Other | Admitting: *Deleted

## 2020-02-28 ENCOUNTER — Other Ambulatory Visit: Payer: Self-pay

## 2020-02-28 VITALS — BP 118/74 | HR 69 | Ht 64.0 in | Wt 151.8 lb

## 2020-02-28 DIAGNOSIS — O209 Hemorrhage in early pregnancy, unspecified: Secondary | ICD-10-CM

## 2020-02-28 DIAGNOSIS — O3680X0 Pregnancy with inconclusive fetal viability, not applicable or unspecified: Secondary | ICD-10-CM

## 2020-02-28 LAB — BETA HCG QUANT (REF LAB): hCG Quant: 87 m[IU]/mL

## 2020-02-28 LAB — GC/CHLAMYDIA PROBE AMP (~~LOC~~) NOT AT ARMC
Chlamydia: NEGATIVE
Comment: NEGATIVE
Comment: NORMAL
Neisseria Gonorrhea: NEGATIVE

## 2020-02-28 NOTE — Progress Notes (Signed)
Lab visit changed to nurse visit. Orders to be placed by RN at time of visit.

## 2020-02-28 NOTE — Progress Notes (Signed)
Pt reports no pain. Still having some dark vaginal bleeding - not heavy. Stat BHCG drawn. Pt advised she will be notified of results later today. Pt provided alternate phone number [phone] (boyfriend) in case she does not answer. She voiced understanding and had no questions.  1125  BHCG results reviewed by Dr. Vergie Living who finds 47% rise. This is consistent with early pregnancy. Pt will be scheduled for follow up US in 2 weeks.   1320 I called pt and informed her of test results and plan of care. She will be notified of [place] appointment via MyChart. Pt was advised to return to [facility] if she develops heavy vaginal bleeding or severe abdominal/pelvic pain. Pt voiced understanding of all information and instructions given.

## 2020-03-03 NOTE — Progress Notes (Signed)
Patient was assessed and managed by nursing staff during this encounter. I have reviewed the chart and agree with the documentation and plan. I have also made any necessary editorial changes.  [place] Bing, MD 03/03/2020 7:27 AM

## 2020-03-12 ENCOUNTER — Inpatient Hospital Stay (HOSPITAL_COMMUNITY)
Admission: AD | Admit: 2020-03-12 | Discharge: 2020-03-12 | Disposition: A | Discharge status: Home / Self Care | Payer: Medicaid Other | Attending: Obstetrics and Gynecology | Admitting: Obstetrics and Gynecology

## 2020-03-12 ENCOUNTER — Other Ambulatory Visit: Payer: Self-pay

## 2020-03-12 ENCOUNTER — Ambulatory Visit (INDEPENDENT_AMBULATORY_CARE_PROVIDER_SITE_OTHER): Payer: Medicaid Other

## 2020-03-12 ENCOUNTER — Ambulatory Visit
Admission: RE | Admit: 2020-03-12 | Discharge: 2020-03-12 | Disposition: A | Discharge status: Home / Self Care | Payer: Medicaid Other | Source: Ambulatory Visit | Attending: Obstetrics and Gynecology | Admitting: Obstetrics and Gynecology

## 2020-03-12 ENCOUNTER — Encounter: Payer: Self-pay | Admitting: Obstetrics and Gynecology

## 2020-03-12 VITALS — BP 118/70 | HR 72 | Temp 98.6°F | Wt 150.3 lb

## 2020-03-12 DIAGNOSIS — O00101 Right tubal pregnancy without intrauterine pregnancy: Secondary | ICD-10-CM | POA: Diagnosis present

## 2020-03-12 DIAGNOSIS — O3680X0 Pregnancy with inconclusive fetal viability, not applicable or unspecified: Secondary | ICD-10-CM

## 2020-03-12 DIAGNOSIS — F17210 Nicotine dependence, cigarettes, uncomplicated: Secondary | ICD-10-CM | POA: Diagnosis not present

## 2020-03-12 DIAGNOSIS — Z3A01 Less than 8 weeks gestation of pregnancy: Secondary | ICD-10-CM | POA: Diagnosis not present

## 2020-03-12 DIAGNOSIS — O99331 Smoking (tobacco) complicating pregnancy, first trimester: Secondary | ICD-10-CM | POA: Insufficient documentation

## 2020-03-12 DIAGNOSIS — Z880 Allergy status to penicillin: Secondary | ICD-10-CM | POA: Insufficient documentation

## 2020-03-12 DIAGNOSIS — O00109 Unspecified tubal pregnancy without intrauterine pregnancy: Secondary | ICD-10-CM

## 2020-03-12 DIAGNOSIS — Z8759 Personal history of other complications of pregnancy, childbirth and the puerperium: Secondary | ICD-10-CM | POA: Insufficient documentation

## 2020-03-12 LAB — COMPREHENSIVE METABOLIC PANEL
ALT: 15 U/L (ref 0–44)
AST: 16 U/L (ref 15–41)
Albumin: 3.9 g/dL (ref 3.5–5.0)
Alkaline Phosphatase: 34 U/L — ABNORMAL LOW (ref 38–126)
Anion gap: 9 (ref 5–15)
BUN: 10 mg/dL (ref 6–20)
CO2: 22 mmol/L (ref 22–32)
Calcium: 9.4 mg/dL (ref 8.9–10.3)
Chloride: 106 mmol/L (ref 98–111)
Creatinine, Ser: 0.79 mg/dL (ref 0.44–1.00)
GFR calc Af Amer: >60 mL/min (ref >60)
GFR calc non Af Amer: >60 mL/min (ref >60)
Glucose, Bld: 92 mg/dL (ref 70–99)
Potassium: 4.4 mmol/L (ref 3.5–5.1)
Sodium: 137 mmol/L (ref 135–145)
Total Bilirubin: 0.8 mg/dL (ref 0.3–1.2)
Total Protein: 7.1 g/dL (ref 6.5–8.1)

## 2020-03-12 LAB — CBC
HCT: 34.5 % — ABNORMAL LOW (ref 36.0–46.0)
Hemoglobin: 11.2 g/dL — ABNORMAL LOW (ref 12.0–15.0)
MCH: 27 pg (ref 26.0–34.0)
MCHC: 32.5 g/dL (ref 30.0–36.0)
MCV: 83.1 fL (ref 80.0–100.0)
Platelets: 130 10*3/uL — ABNORMAL LOW (ref 150–400)
RBC: 4.15 MIL/uL (ref 3.87–5.11)
RDW: 18.2 % — ABNORMAL HIGH (ref 11.5–15.5)
WBC: 5.7 10*3/uL (ref 4.0–10.5)
nRBC: 0 % (ref 0.0–0.2)

## 2020-03-12 LAB — HCG, QUANTITATIVE, PREGNANCY: hCG, Beta Chain, Quant, S: 1869 m[IU]/mL — ABNORMAL HIGH (ref <5)

## 2020-03-12 MED ORDER — METHOTREXATE FOR ECTOPIC PREGNANCY
50.0000 mg/m2 | Freq: Once | INTRAMUSCULAR | Status: AC
  Administered 2020-03-12: 90 mg via INTRAMUSCULAR
  Filled 2020-03-12: qty 1

## 2020-03-12 NOTE — MAU Provider Note (Signed)
History     CSN: [id number]  Arrival date and time: 03/12/20 1313   First Provider Initiated Contact with Patient 03/12/20 1353      Chief Complaint  Patient presents with  . Ectopic Pregnancy   HPI Veronica Jones is a 29 y.o. G2P1001 at [redacted]w[redacted]d who presents to MAU from the office for MTX following diagnosis of right ectopic pregnancy. She denies abdominal pain and vaginal bleeding.  OB History    Gravida  2   Para  1   Term  1   Preterm      AB      Living  1     SAB      TAB      Ectopic      Multiple  0   Live Births  1           Past Medical History:  Diagnosis Date  . Chlamydia 2013  . Medical history non-contributory   . Postpartum eclampsia 04/19/2015    Past Surgical History:  Procedure Laterality Date  . NO PAST SURGERIES      Family History  Problem Relation Age of Onset  . Hypertension Paternal Grandmother   . Cancer Paternal Grandmother        breast  . Diabetes Paternal Grandmother   . Hypertension Mother   . Hypertension Father   . Diabetes Maternal Aunt     Social History   Tobacco Use  . Smoking status: Current Some Day Smoker    Packs/day: 0.50    Years: 1.00    Pack years: 0.50    Types: Cigarettes    Last attempt to quit: 08/03/2014    Years since quitting: 5.6  . Smokeless tobacco: Never Used  Vaping Use  . Vaping Use: Never used  Substance Use Topics  . Alcohol use: No    Alcohol/week: 0.0 standard drinks    Comment: not since pregnancy  . Drug use: Yes    Types: Marijuana    Comment: 1 week asgo    Allergies:  Allergies  Allergen Reactions  . Amoxicillin Hives  . Penicillins Hives    Has patient had a PCN reaction causing immediate rash, facial/tongue/throat swelling, SOB or lightheadedness with hypotension: No Has patient had a PCN reaction causing severe rash involving mucus membranes or skin necrosis: No Has patient had a PCN reaction that required hospitalization No Has patient had a PCN reaction  occurring within the last 10 years: No If all of the above answers are "NO", then may proceed with Cephalosporin use.     Medications Prior to Admission  Medication Sig Dispense Refill Last Dose  . ondansetron (ZOFRAN-ODT) 8 MG disintegrating tablet Take 1 tablet (8 mg total) by mouth every 8 (eight) hours as needed for nausea or vomiting. 30 tablet 0     Review of Systems  Gastrointestinal: Negative for abdominal pain.  Genitourinary: Negative for vaginal bleeding.  All other systems reviewed and are negative.  Physical Exam   Blood pressure 123/72, pulse 85, temperature 98.3 F (36.8 C), resp. rate 16, height 5\' 4"  (1.626 m), weight 68.8 kg, last menstrual period 01/25/2020, SpO2 99 %, unknown if currently breastfeeding.  Physical Exam Vitals and nursing note reviewed. Exam conducted with a chaperone present.  Constitutional:      Appearance: Normal appearance.  Cardiovascular:     Rate and Rhythm: Normal rate.     Pulses: Normal pulses.  Pulmonary:     Effort: Pulmonary effort is normal.  Abdominal:     General: Abdomen is flat.  Skin:    General: Skin is warm and dry.     Capillary Refill: Capillary refill takes less than 2 seconds.  Neurological:     General: No focal deficit present.     Mental Status: She is alert.  Psychiatric:        Mood and Affect: Mood normal.     MAU Course  Procedures Patient Vitals for the past 24 hrs:  BP Temp Pulse Resp SpO2 Height Weight  03/12/20 1339 123/72 98.3 F (36.8 C) 85 16 99 % 5\' 4"  (1.626 m) 68.8 kg   Results for orders placed or performed during the hospital encounter of 03/12/20 (from the past 24 hour(s))  CBC     Status: Abnormal   Collection Time: 03/12/20  2:35 PM  Result Value Ref Range   WBC 5.7 4.0 - 10.5 K/uL   RBC 4.15 3.87 - 5.11 MIL/uL   Hemoglobin 11.2 (L) 12.0 - 15.0 g/dL   HCT 03/14/20 (L) 36 - 46 %   MCV 83.1 80.0 - 100.0 fL   MCH 27.0 26.0 - 34.0 pg   MCHC 32.5 30.0 - 36.0 g/dL   RDW 22.0 (H) 25.4 -  15.5 %   Platelets 130 (L) 150 - 400 K/uL   nRBC 0.0 0.0 - 0.2 %  Comprehensive metabolic panel     Status: Abnormal   Collection Time: 03/12/20  2:35 PM  Result Value Ref Range   Sodium 137 135 - 145 mmol/L   Potassium 4.4 3.5 - 5.1 mmol/L   Chloride 106 98 - 111 mmol/L   CO2 22 22 - 32 mmol/L   Glucose, Bld 92 70 - 99 mg/dL   BUN 10 6 - 20 mg/dL   Creatinine, Ser 03/14/20 0.44 - 1.00 mg/dL   Calcium 9.4 8.9 - 6.23 mg/dL   Total Protein 7.1 6.5 - 8.1 g/dL   Albumin 3.9 3.5 - 5.0 g/dL   AST 16 15 - 41 U/L   ALT 15 0 - 44 U/L   Alkaline Phosphatase 34 (L) 38 - 126 U/L   Total Bilirubin 0.8 0.3 - 1.2 mg/dL   GFR calc non Af Amer >60 >60 mL/min   GFR calc Af Amer >60 >60 mL/min   Anion gap 9 5 - 15  hCG, quantitative, pregnancy     Status: Abnormal   Collection Time: 03/12/20  2:35 PM  Result Value Ref Range   hCG, Beta Chain, Quant, S 1,869 (H) <5 mIU/mL   03-14-1970 OB Transvaginal  Result Date: 03/12/2020 CLINICAL DATA:  Pregnancy of unknown anatomic location EXAM: TRANSVAGINAL OB ULTRASOUND TECHNIQUE: Transvaginal ultrasound was performed for complete evaluation of the gestation as well as the maternal uterus, adnexal regions, and pelvic cul-de-sac. COMPARISON:  02/27/2020 FINDINGS: Intrauterine gestational sac: Absent Yolk sac:  N/A Embryo:  N/A Cardiac Activity: N/A Heart Rate: N/A bpm MSD:   mm    w     d CRL:     mm    w  d                  02/29/2020 EDC: Subchorionic hemorrhage:  N/A . Maternal uterus/adnexae: Maternal uterus normal appearance with endometrial complex 4 mm thick, normal. No uterine mass, gestational sac or endometrial fluid. RIGHT ovary measures 3.3 x 1.7 x 3.0 cm and contains a small corpus luteum. LEFT ovary normal size and morphology, 2.9 x 1.4 x 1.7 cm. Small amount of  nonspecific free pelvic fluid. Small mass identified within the RIGHT adnexa separate from the RIGHT ovary measuring 16 x 6 x 13 mm in size, appears to be related to the fallopian tube. Finding is suspicious for  an early ectopic pregnancy. IMPRESSION: No intrauterine gestation identified. 16 x 6 x 13 mm diameter nodule seen in RIGHT adnexa separate from the RIGHT ovary, question along the fallopian tube, suspicious for an early ectopic pregnancy. Recommend correlation with quantitative beta HCG. Critical Value/emergent results were called by telephone at the time of interpretation on 03/12/2020 at 12:09 pm to provider [place] Bing MD, who verbally acknowledged these results. Electronically Signed   By: Ulyses Southward M.D.   On: 03/12/2020 12:11   Methotrexate Treatment Protocol for Ectopic Pregnancy  Pretreatment testing and instructions  hCG concentration  Transvaginal ultrasound  Blood group and Rh(D) typing; give Rhogam 300 mcg IM, if indicated (N/A) Complete blood count  Liver and renal function tests  Discontinue folic acid supplements  Counsel patient to avoid NSAIDs, recommend acetaminophen if an analgesic is needed  Advise patient to refrain from sexual intercourse and strenuous exercise  Treatment day  Single dose protocol   1 Thursday 03/12/2020 hCG.  Administer Methotrexate 50 mg/m2 body surface area IM  4 Sunday 03/15/2020 hCG  7 Wednesday     10 /11/2019  hCG  If <15 percent hCG decline from day 4 to 7, give additional dose of methotrexate 50 mg/m2 IM  If ?15 percent hCG decline from day 4 to 7, draw hCG weekly until undetectable    Prepared with data from:   [employer] KT. Clinical practice. Ectopic pregnancy. 12/2019 Med 2009; 361:379  American College of Obstetricians and Gynecologists. ACOG Practice Bulletin No. 94: Medical management of ectopic pregnancy. Obstet Gynecol 2008; 2009.   Assessment and Plan  --29 y.o. G2P1001 at [redacted]w[redacted]d  --right ectopic --S/p Methotrexate in MAU --Discharge home in stable condition  F/U: --Day 4 Quant hCG in MAU Sunday 03/15/2020  05/15/2020, CNM 03/12/2020, 4:57 PM

## 2020-03-12 NOTE — Discharge Instructions (Signed)
Methotrexate Treatment for an Ectopic Pregnancy, Care After This sheet gives you information about how to care for yourself after your procedure. Your health care provider may also give you more specific instructions. If you have problems or questions, contact your health care provider. What can I expect after the procedure? After the procedure, it is common to have:  Abdominal cramping.  Vaginal bleeding.  Fatigue.  Nausea.  Vomiting.  Diarrhea. Blood tests will be taken at timed intervals for several days or weeks to check your pregnancy hormone levels. The blood tests will be done until the pregnancy hormone can no longer be detected in the blood. Follow these instructions at home: Activity  Do not have sex until your health care provider approves.  Limit activities that take a lot of effort as told by your health care provider. Medicines  Take over the counter and prescription medicines only as told by your health care provider.  Do not take aspirin, ibuprofen, naproxen, or any other NSAIDs.  Do not take folic acid, prenatal vitamins, or other vitamins that contain folic acid. General instructions   Do not drink alcohol.  Follow instructions from your health care provider on how and when to report any symptoms that may indicate a ruptured ectopic pregnancy.  Keep all follow-up visits as told by your health care provider. This is important. Contact a health care provider if:  You have persistent nausea and vomiting.  You have persistent diarrhea.  You are having a reaction to the medicine, such as: ? Tiredness. ? Skin rash. ? Hair loss. Get help right away if:  Your abdominal or pelvic pain gets worse.  You have more vaginal bleeding.  You feel light-headed or you faint.  You have shortness of breath.  Your heart rate increases.  You develop a cough.  You have chills.  You have a fever. Summary  After the procedure, it is common to have symptoms  of abdominal cramping, vaginal bleeding and fatigue. You may also experience other symptoms.  Blood tests will be taken at timed intervals for several days or weeks to check your pregnancy hormone levels. The blood tests will be done until the pregnancy hormone can no longer be detected in the blood.  Limit strenuous activity as told by your health care provider.  Follow instructions from your health care provider on how and when to report any symptoms that may indicate a ruptured ectopic pregnancy. This information is not intended to replace advice given to you by your health care provider. Make sure you discuss any questions you have with your health care provider. Document Revised: 05/12/2017 Document Reviewed: 07/19/2016 Elsevier Patient Education  2020 Elsevier Inc.  

## 2020-03-12 NOTE — MAU Note (Signed)
.   Veronica Jones is a 29 y.o. at [redacted]w[redacted]d here in MAU reporting: she was sent over for an ectopic pregnancy. Pt denies any pain or vaginal bleeding. LMP: 01/25/20 Onset of complaint: ongoing Pain score: 0 Vitals:   03/12/20 1339  BP: 123/72  Pulse: 85  Resp: 16  Temp: 98.3 F (36.8 C)  SpO2: 99%     FHT: Lab orders placed from triage:

## 2020-03-12 NOTE — Progress Notes (Signed)
Pt here today for results following [place] for pregnancy of unknown location. Report called by radiologist to Vergie Living, MD. Spoke with Vergie Living, MD via phone who states pt has confirmed ectopic pregnancy and should present to MAU for further evaluation and treatment.   Results and provider recommendation reviewed with pt. Pt tearful at this time, called partner to be on speaker phone for remainder of visit. Vitals WNL; denies any pain or bleeding at this time. Pt states she will go to partner's house and he will drive her to the MAU. Appt made for 03/18/20 for stat beta nurse visit due to possible mtx treatment per Vergie Living, MD.   Fleet Contras RN 03/12/20

## 2020-03-16 ENCOUNTER — Other Ambulatory Visit: Payer: Self-pay

## 2020-03-16 ENCOUNTER — Inpatient Hospital Stay (HOSPITAL_COMMUNITY)
Admission: AD | Admit: 2020-03-16 | Discharge: 2020-03-16 | Disposition: A | Discharge status: Home / Self Care | Payer: Medicaid Other | Attending: Family Medicine | Admitting: Family Medicine

## 2020-03-16 DIAGNOSIS — F32A Depression, unspecified: Secondary | ICD-10-CM | POA: Diagnosis not present

## 2020-03-16 DIAGNOSIS — Z3A01 Less than 8 weeks gestation of pregnancy: Secondary | ICD-10-CM | POA: Diagnosis not present

## 2020-03-16 DIAGNOSIS — O00101 Right tubal pregnancy without intrauterine pregnancy: Secondary | ICD-10-CM | POA: Diagnosis present

## 2020-03-16 LAB — HCG, QUANTITATIVE, PREGNANCY: hCG, Beta Chain, Quant, S: 2305 m[IU]/mL — ABNORMAL HIGH (ref <5)

## 2020-03-16 NOTE — MAU Provider Note (Signed)
[name]  is a 29 y.o. G2P1001  at [redacted]w[redacted]d who presents to MAU today for day 4 HCG s/p MTX for right ectopic. She was supposed to come yesterday for labs but states the wait was too long. Reports mild abdominal cramping & vaginal spotting. Also feeling sad due to loss of pregnancy.    BP 120/68 (BP Location: Right Arm)   Pulse 82   Temp 98.5 F (36.9 C) (Oral)   Resp 16   LMP 01/25/2020   SpO2 100%   GENERAL: Well-developed, well-nourished female in no acute distress.  HEENT: Normocephalic, atraumatic.   LUNGS: Effort normal HEART: Regular rate  SKIN: Warm, dry and without erythema PSYCH: Normal mood and affect  Component     Latest Ref Rng & Units 03/12/2020 03/16/2020  HCG, Beta Chain, Quant, S     <5 mIU/mL 1,869 (H) MTX given 2,305 (H) Day 4    A: 1. Right tubal pregnancy without intrauterine pregnancy   -mild elevation in HCG today. Pt is stable. Reviewed with Dr. Adrian Blackwater; ok to keep scheduled day 7 labs this Thursday  P: Discharge home Ectopic precautions reviewed Keep appt on Thursday for day 7 labs Referral placed to see [facility]BH at [facility] for depression   Judeth Horn, NP  03/16/2020 1:33 PM

## 2020-03-16 NOTE — Discharge Instructions (Signed)
Return to care   If you have heavier bleeding that soaks through more that 2 pads per hour for an hour or more  If you bleed so much that you feel like you might pass out or you do pass out  If you have significant abdominal pain that is not improved with Tylenol       Managing Pregnancy Loss Pregnancy loss can happen any time during a pregnancy. Often the cause is not known. It is rarely because of anything you did. Pregnancy loss in early pregnancy (during the first trimester) is called a miscarriage. This type of pregnancy loss is the most common. Pregnancy loss that happens after 20 weeks of pregnancy is called fetal demise if the baby's heart stops beating before birth. Fetal demise is much less common. Some women experience spontaneous labor shortly after fetal demise resulting in a stillborn birth (stillbirth). Any pregnancy loss can be devastating. You will need to recover both physically and emotionally. Most women are able to get pregnant again after a pregnancy loss and deliver a healthy baby. How to manage emotional recovery  Pregnancy loss is very hard emotionally. You may feel many different emotions while you grieve. You may feel sad and angry. You may also feel guilty. It is normal to have periods of crying. Emotional recovery can take longer than physical recovery. It is different for everyone. Taking these steps can help you in managing this loss:  Remember that it is unlikely you did anything to cause the pregnancy loss.  Share your thoughts and feelings with friends, family, and your partner. Remember that your partner is also recovering emotionally.  Make sure you have a good support system. Do not spend too much time alone.  Meet with a pregnancy loss counselor or join a pregnancy loss support group.  Get enough sleep and eat a healthy diet. Return to regular exercise when you have recovered physically.  Do not use drugs or alcohol to manage your  emotions.  Consider seeing a mental health professional to help you recover emotionally.  Ask a friend or loved one to help you decide what to do with any clothing and nursery items you received for your baby. In the case of a stillbirth, many women benefit from taking additional steps in the grieving process. You may want to:  Hold your baby after the birth.  Name your baby.  Request a birth certificate.  Create a keepsake such as handprints or footprints.  Dress your baby and have a picture taken.  Make funeral arrangements.  Ask for a baptism or blessing. Hospitals have staff members who can help you with all these arrangements. How to recognize emotional stress It is normal to have emotional stress after a pregnancy loss. But emotional stress that lasts a long time or becomes severe requires treatment. Watch out for these signs of severe emotional stress:  Sadness, anger, or guilt that is not going away and is interfering with your normal activities.  Relationship problems that have occurred or gotten worse since the pregnancy loss.  Signs of depression that last longer than 2 weeks. These may include: ? Sadness. ? Anxiety. ? Hopelessness. ? Loss of interest in activities you enjoy. ? Inability to concentrate. ? Trouble sleeping or sleeping too much. ? Loss of appetite or overeating. ? Thoughts of death or of hurting yourself. Follow these instructions at home:  Take over-the-counter and prescription medicines only as told by your health care provider.  Rest at home until  your energy level returns. Return to your normal activities as told by your health care provider. Ask your health care provider what activities are safe for you.  When you are ready, meet with your health care provider to discuss steps to take for a future pregnancy.  Keep all follow-up visits as told by your health care provider. This is important. Where to find support  To help you and your  partner with the process of grieving, talk with your health care provider or seek counseling.  Consider meeting with others who have experienced pregnancy loss. Ask your health care provider about support groups and resources. Where to find more information  [place] Department of Health and Cytogeneticist on Women's Health: http://hoffman.com/  American Pregnancy Association: www.americanpregnancy.org Contact a health care provider if:  You continue to experience grief, sadness, or lack of motivation for everyday activities, and those feelings do not improve over time.  You are struggling to recover emotionally, especially if you are using alcohol or substances to help. Get help right away if:  You have thoughts of hurting yourself or others. If you ever feel like you may hurt yourself or others, or have thoughts about taking your own life, get help right away. You can go to your nearest emergency department or call:  Your local emergency services (911 in the [place]  A suicide crisis helpline, such as the National Suicide Prevention Lifeline at 202-121-7678. This is open 24 hours a day. Summary  Any pregnancy loss can be difficult physically and emotionally.  You may experience many different emotions while you grieve. Emotional recovery can last longer than physical recovery.  It is normal to have emotional stress after a pregnancy loss. But emotional stress that lasts a long time or becomes severe requires treatment.  See your health care provider if you are struggling emotionally after a pregnancy loss. This information is not intended to replace advice given to you by your health care provider. Make sure you discuss any questions you have with your health care provider. Document Revised: 09/19/2018 Document Reviewed: 08/10/2017 Elsevier Patient Education  2020 [employer].

## 2020-03-16 NOTE — Progress Notes (Signed)
Chaplain received consult from RN, Veronica Jones who reports that pt has been tearful.  She came in for follow up after receiving a methotrexate injection due to an ectopic pregnancy.  Veronica Jones shared that the father of this child is also the father of her 27 year old daughter and while she wanted her children to have the same parents she is not certain that he is a good support to her in this season of her life.  She shared some of the trouble they've been having and reports that she told her parents about the pregnancy at the same time she told them about the shot because they do not like him.  Veronica Jones reports she wants to keep everyone happy and is working very hard, but has felt increasingly depressed.  She's overwhelmed with some financial stress in addition to all of the interpersonal stress.  Chaplain provided some education regarding triggers for perinatal mood and anxiety disorders and encouraged Veronica Jones to find additional support as she seeks to care for herself and her daughter.  Veronica Jones shared that she used to go to [facility] when she was pregnant with her daughter, but [facility]  She reports she would be agreeable to following up with a [facility] provider.    Chaplain reviewed pt visit with Veronica Jones who confirmed that pt could be scheduled for follow up at a clinic with integrated behavioral health for continued support and treatment of her emotional and mental health needs in addition to her physical health.  Veronica Jones was given my contact information for continued follow up as she finds herself in need of support.  Please page as further needs arise.  Veronica Jones. Carley Hammed, M.Div. [facility] Chaplain Pager [phone] Office [phone]

## 2020-03-16 NOTE — MAU Note (Signed)
Came yesterday- wasn't seen, waited for hours.  Is feeling a little depressed.  Just started having some light bleeding today.  Mild cramping- usually in the morning.

## 2020-03-17 NOTE — Progress Notes (Signed)
Patient was assessed and managed by nursing staff during this encounter. I have reviewed the chart and agree with the documentation and plan. I have also made any necessary editorial changes.  Gyasi Hazzard, MD 03/17/2020 10:47 PM   

## 2020-03-19 ENCOUNTER — Ambulatory Visit (INDEPENDENT_AMBULATORY_CARE_PROVIDER_SITE_OTHER): Payer: Medicaid Other

## 2020-03-19 ENCOUNTER — Other Ambulatory Visit: Payer: Self-pay

## 2020-03-19 VITALS — BP 122/73 | HR 82 | Wt 149.9 lb

## 2020-03-19 DIAGNOSIS — Z1331 Encounter for screening for depression: Secondary | ICD-10-CM | POA: Diagnosis not present

## 2020-03-19 DIAGNOSIS — O00101 Right tubal pregnancy without intrauterine pregnancy: Secondary | ICD-10-CM | POA: Diagnosis not present

## 2020-03-19 LAB — BETA HCG QUANT (REF LAB): hCG Quant: 1014 m[IU]/mL

## 2020-03-19 NOTE — Progress Notes (Signed)
Pt here today for STAT beta lab s/p day 7 MTX tx.  Pt reports that she is having light bleeding that she changes a pad once a day.  Pt also reports mild cramping in the lower abd.  I advised pt that it will take approximately two hours for results and I will call with results and f/u.  Pt verbalized understanding.   Contacted pt in regards to her elevated PHQ-9 and GAD-7.  Pt reports that she has not had feelings of hurting herself however she has thought about what her would be like.  I advised pt that I would request our [facility] to contact her so that she can further discuss how she is feeling.  Pt very appreciative and stated thank you with no further questions.   Received notification from [facility] that pt's results beta results are 1,014.  Notified Dr. Mayford Knife, provider recommendation is levels are decreasing appropriately and to do weekly follow up non stat beta's.   Informed pt providers recommendation.  Pt stated that she will be able to come in on 03/26/20 @ 1430.  Front office notified.  Pt did not have any further questions or concerns.    Addison Naegeli, RN 03/19/20

## 2020-03-23 NOTE — BH Specialist Note (Signed)
Pt did not arrive to video visit and did not answer the phone after two call and two text invites to video visit; "call cannot be completed at this time" at both phone calls, so unable to leave a voice message; left MyChart message for patient.

## 2020-03-30 ENCOUNTER — Ambulatory Visit: Payer: Medicaid Other

## 2020-03-30 ENCOUNTER — Other Ambulatory Visit: Payer: Medicaid Other

## 2020-03-31 ENCOUNTER — Other Ambulatory Visit: Payer: Self-pay

## 2020-03-31 ENCOUNTER — Ambulatory Visit: Payer: Medicaid Other | Admitting: Clinical

## 2020-03-31 DIAGNOSIS — Z91199 Patient's noncompliance with other medical treatment and regimen due to unspecified reason: Secondary | ICD-10-CM

## 2020-03-31 DIAGNOSIS — Z5329 Procedure and treatment not carried out because of patient's decision for other reasons: Secondary | ICD-10-CM

## 2020-10-29 ENCOUNTER — Telehealth: Payer: Medicaid Other | Admitting: Physician Assistant

## 2020-10-29 ENCOUNTER — Encounter: Payer: Self-pay | Admitting: Physician Assistant

## 2020-10-29 DIAGNOSIS — K047 Periapical abscess without sinus: Secondary | ICD-10-CM

## 2020-10-29 MED ORDER — CLINDAMYCIN HCL 300 MG PO CAPS: 300.0000 mg | ORAL_CAPSULE | Freq: Three times a day (TID) | ORAL | 0 refills | Status: DC

## 2020-10-29 NOTE — Patient Instructions (Signed)
Dental Abscess  A dental abscess is a collection of pus in or around a tooth that results from an infection. An abscess can cause pain in the affected area as well as other symptoms. Treatment is important to help with symptoms and to prevent the infection from spreading. What are the causes? This condition is caused by a bacterial infection around the root of the tooth that involves the inner part of the tooth (pulp). It may result from:  Severe tooth decay.  Trauma to the tooth, such as a broken or chipped tooth, that allows bacteria to enter into the pulp.  Severe gum disease around a tooth. What increases the risk? This condition is more likely to develop in males. It is also more likely to develop in people who:  Have dental decay (cavities).  Eat sugary snacks between meals.  Use tobacco products.  Have diabetes.  Have a weakened disease-fighting system (immune system).  Do not brush and care for their teeth regularly. What are the signs or symptoms? Symptoms of this condition include:  Severe pain in and around the infected tooth.  Swelling and redness around the infected tooth, in the mouth, or in the face.  Tenderness.  Pus drainage.  Bad breath.  Bitter taste in the mouth.  Difficulty swallowing.  Difficulty opening the mouth.  Nausea.  Vomiting.  Chills.  Swollen neck glands.  Fever. How is this diagnosed? This condition is diagnosed based on:  Your symptoms and your medical and dental history.  An examination of the infected tooth. During the exam, your dentist may tap on the infected tooth. You may also have X-rays of the affected area. How is this treated? This condition is treated by getting rid of the infection. This may be done with:  Incision and drainage. This procedure is done by making an incision in the abscess to drain out the pus. Removing pus is the first priority in treating an abscess.  Antibiotic medicines. These may be used  in certain situations.  Antibacterial mouth rinse.  A root canal. This may be performed to save the tooth. Your dentist accesses the visible part of your tooth (crown) with a drill and removes any damaged pulp. Then the space is filled and sealed off.  Tooth extraction. The tooth is pulled out if it cannot be saved by other treatment. You may also receive treatment for pain, such as:  Acetaminophen or NSAIDs.  Gels that contain a numbing medicine.  An injection to block the pain near your nerve. Follow these instructions at home: Medicines  Take over-the-counter and prescription medicines only as told by your dentist.  If you were prescribed an antibiotic, take it as told by your dentist. Do not stop taking the antibiotic even if you start to feel better.  If you were prescribed a gel that contains a numbing medicine, use it exactly as told in the directions. Do not use these gels for children who are younger than 2 years of age.  Do not drive or use heavy machinery while taking prescription pain medicine. General instructions  Rinse out your mouth often with salt water to relieve pain or swelling. To make a salt-water mixture, completely dissolve -1 tsp of salt in 1 cup of warm water.  Eat a soft diet while your abscess is healing.  Drink enough fluid to keep your urine pale yellow.  Do not apply heat to the outside of your mouth.  Do not use any products that contain nicotine or   tobacco, such as cigarettes and e-cigarettes. If you need help quitting, ask your health care provider.  Keep all follow-up visits as told by your dentist. This is important.   How is this prevented?  Brush your teeth every morning and night with fluoride toothpaste. Floss one time each day.  Get regularly scheduled dental cleanings.  Consider having a dental sealant applied on teeth that have deep holes (caries).  Drink fluoridated water regularly. This includes most tap water. Check the label  on bottled water to see if it contains fluoride.  Drink water instead of sugary drinks.  Eat healthy meals and snacks.  Wear a mouth guard or face shield to protect your teeth while playing sports. Contact a health care provider if:  Your pain is worse and is not helped by medicine. Get help right away if:  You have a fever or chills.  Your symptoms suddenly get worse.  You have a very bad headache.  You have problems breathing or swallowing.  You have trouble opening your mouth.  You have swelling in your neck or around your eye. Summary  A dental abscess is a collection of pus in or around a tooth that results from an infection.  A dental abscess may result from severe tooth decay, trauma to the tooth, or severe gum disease around a tooth.  Symptoms include severe pain, swelling, redness, and drainage of pus in and around the infected tooth.  The first priority in treating a dental abscess is to drain out the pus. Treatment may also involve removing damage inside the tooth (root canal) or pulling out (extracting) the tooth. This information is not intended to replace advice given to you by your health care provider. Make sure you discuss any questions you have with your health care provider. Document Revised: 12/20/2019 Document Reviewed: 12/20/2019 Elsevier Patient Education  2021 [employer]  

## 2020-10-29 NOTE — Progress Notes (Signed)
[name], lerner are scheduled for a virtual visit with your provider today.    Just as we do with appointments in the office, we must obtain your consent to participate.  Your consent will be active for this visit and any virtual visit you may have with one of our providers in the next 365 days.    If you have a MyChart account, I can also send a copy of this consent to you electronically.  All virtual visits are billed to your insurance company just like a traditional visit in the office.  As this is a virtual visit, video technology does not allow for your provider to perform a traditional examination.  This may limit your provider's ability to fully assess your condition.  If your provider identifies any concerns that need to be evaluated in person or the need to arrange testing such as labs, EKG, etc, we will make arrangements to do so.    Although advances in technology are sophisticated, we cannot ensure that it will always work on either your end or our end.  If the connection with a video visit is poor, we may have to switch to a telephone visit.  With either a video or telephone visit, we are not always able to ensure that we have a secure connection.   I need to obtain your verbal consent now.   Are you willing to proceed with your visit today?   Veronica Jones has provided verbal consent on 10/29/2020 for a virtual visit (video or telephone).   Veronica Loveless, PA-C 10/29/2020  10:24 AM    MyChart Video Visit    Virtual Visit via Video Note   This visit type was conducted due to national recommendations for restrictions regarding the COVID-19 Pandemic (e.g. social distancing) in an effort to limit this patient's exposure and mitigate transmission in our community. This patient is at least at moderate risk for complications without adequate follow up. This format is felt to be most appropriate for this patient at this time. Physical exam was limited by quality of the video and audio  technology used for the visit.   Patient location: Home Provider location: Home office in [place]  I discussed the limitations of evaluation and management by telemedicine and the availability of in person appointments. The patient expressed understanding and agreed to proceed.  Patient: Veronica Jones   DOB: 08-06-1990   30 y.o. Female  MRN: [id number] Visit Date: 10/29/2020  Today's healthcare provider: [name], PA-C   No chief complaint on file.  Subjective    Dental Pain  This is a recurrent problem. The current episode started yesterday (started hurting yesterday and caused her to have trouble sleeping). The problem occurs constantly. The problem has been unchanged. The pain is at a severity of 6/10. The pain is moderate. Associated symptoms include facial pain. Pertinent negatives include no difficulty swallowing or fever. She has tried NSAIDs for the symptoms. The treatment provided no relief.    Has had for a while but became painful yesterday. Has not had time to find a dentist yet due to work schedule.  Patient Active Problem List   Diagnosis Date Noted  . Ectopic pregnancy, tubal 03/12/2020   Past Medical History:  Diagnosis Date  . Chlamydia 2013  . Medical history non-contributory   . Postpartum eclampsia 04/19/2015     Patient Active Problem List   Diagnosis Date Noted  . Ectopic pregnancy, tubal 03/12/2020   Past Medical History:  Diagnosis Date  . Chlamydia 2013  . Medical history non-contributory   . Postpartum eclampsia 04/19/2015      Medications: Outpatient Medications Prior to Visit  Medication Sig  . ondansetron (ZOFRAN-ODT) 8 MG disintegrating tablet Take 1 tablet (8 mg total) by mouth every 8 (eight) hours as needed for nausea or vomiting.   No facility-administered medications prior to visit.    Review of Systems  Constitutional: Negative for chills and fever.  HENT: Positive for dental problem and facial swelling.    Respiratory: Negative.   Cardiovascular: Negative.   Gastrointestinal: Positive for abdominal pain and nausea. Negative for diarrhea and vomiting.    Last CBC Lab Results  Component Value Date   WBC 5.7 03/12/2020   HGB 11.2 (L) 03/12/2020   HCT 34.5 (L) 03/12/2020   MCV 83.1 03/12/2020   MCH 27.0 03/12/2020   RDW 18.2 (H) 03/12/2020   PLT 130 (L) 03/12/2020   Last metabolic panel Lab Results  Component Value Date   GLUCOSE 92 03/12/2020   NA 137 03/12/2020   K 4.4 03/12/2020   CL 106 03/12/2020   CO2 22 03/12/2020   BUN 10 03/12/2020   CREATININE 0.79 03/12/2020   GFRNONAA >60 03/12/2020   GFRAA >60 03/12/2020   CALCIUM 9.4 03/12/2020   PROT 7.1 03/12/2020   ALBUMIN 3.9 03/12/2020   BILITOT 0.8 03/12/2020   ALKPHOS 34 (L) 03/12/2020   AST 16 03/12/2020   ALT 15 03/12/2020   ANIONGAP 9 03/12/2020      Objective    LMP 01/25/2020  BP Readings from Last 3 Encounters:  03/19/20 122/73  03/16/20 120/68  03/12/20 123/72   Wt Readings from Last 3 Encounters:  03/19/20 149 lb 14.4 oz (68 kg)  03/12/20 151 lb 11.2 oz (68.8 kg)  03/12/20 150 lb 4.8 oz (68.2 kg)      Physical Exam Vitals reviewed.  Constitutional:      Appearance: She is well-developed.  HENT:     Head: Normocephalic and atraumatic.     Mouth/Throat:     Comments: Patient reports facial and gum swelling on the right side, right lower tooth is affected Pulmonary:     Effort: Pulmonary effort is normal. No respiratory distress.  Musculoskeletal:     Cervical back: Normal range of motion and neck supple.  Psychiatric:        Behavior: Behavior normal.        Thought Content: Thought content normal.        Judgment: Judgment normal.        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  Dispense: 30 capsule; Refill: 0   No follow-ups on file.     I discussed the assessment and treatment plan with the patient. The patient was provided an opportunity to ask questions and all were answered. The patient agreed with the plan and demonstrated an understanding of the instructions.   The patient was advised to call back or seek an in-person evaluation if the symptoms worsen or if the condition  fails to improve as anticipated.  I provided 12 minutes of face-to-face time during this encounter via MyChart Video enabled encounter.   Reine Just [facility] [phone] (phone) [phone] (fax)  [facility]

## 2020-12-05 ENCOUNTER — Telehealth: Payer: Medicaid Other | Admitting: Orthopedic Surgery

## 2020-12-05 DIAGNOSIS — N760 Acute vaginitis: Secondary | ICD-10-CM

## 2020-12-05 NOTE — Progress Notes (Signed)
Virtual Visit Consent   Veronica Jones, you are scheduled for a virtual visit with a [facility] provider today.     Just as with appointments in the office, your consent must be obtained to participate.  Your consent will be active for this visit and any virtual visit you may have with one of our providers in the next 365 days.     If you have a MyChart account, a copy of this consent can be sent to you electronically.  All virtual visits are billed to your insurance company just like a traditional visit in the office.    As this is a virtual visit, video technology does not allow for your provider to perform a traditional examination.  This may limit your provider's ability to fully assess your condition.  If your provider identifies any concerns that need to be evaluated in person or the need to arrange testing (such as labs, EKG, etc.), we will make arrangements to do so.     Although advances in technology are sophisticated, we cannot ensure that it will always work on either your end or our end.  If the connection with a video visit is poor, the visit may have to be switched to a telephone visit.  With either a video or telephone visit, we are not always able to ensure that we have a secure connection.     I need to obtain your verbal consent now.   Are you willing to proceed with your visit today? Yes   Veronica Jones has provided verbal consent on 12/05/2020 for a virtual visit (video or telephone).   Freeman Caldron, PA-C   Date: 12/05/2020 1:41 PM   Virtual Visit via Video Note   Veronica Jones, connected Veronica Jones@ ([id number], [birth year]) on 12/05/20 at  1:30 PM EDT by a video-enabled telemedicine application and verified that I am speaking with the correct person using two identifiers.  Location: Patient: Virtual Visit Location Patient: Mobile Provider: Virtual Visit Location Provider: Home Office   I discussed the limitations of evaluation and management by telemedicine  and the availability of in person appointments. The patient expressed understanding and agreed to proceed.    History of Present Illness: Veronica Jones is a 30 y.o. who identifies as a female who was assigned female at birth, and is being seen today for vaginal discomfort of 2d duration. It started with pain accompanied by itching and a slight brownish discharge when she wiped. She also has had some nausea. She denies fevers, chills, sweats, vomiting, and abdominal pain. She denies any new sexual parters. Her menses are 3d late and this is very unusual for her. She admits that she could be pregnant.  HPI: HPI  Problems:  Patient Active Problem List   Diagnosis Date Noted   Ectopic pregnancy, tubal 03/12/2020    Allergies:  Allergies  Allergen Reactions   Amoxicillin Hives   Penicillins Hives    Has patient had a PCN reaction causing immediate rash, facial/tongue/throat swelling, SOB or lightheadedness with hypotension: No Has patient had a PCN reaction causing severe rash involving mucus membranes or skin necrosis: No Has patient had a PCN reaction that required hospitalization No Has patient had a PCN reaction occurring within the last 10 years: No If all of the above answers are "NO", then may proceed with Cephalosporin use.    Medications:  Current Outpatient Medications:    clindamycin (CLEOCIN) 300 MG capsule, Take 1 capsule (300 mg total) by mouth 3 (  three) times daily., Disp: 30 capsule, Rfl: 0   ondansetron (ZOFRAN-ODT) 8 MG disintegrating tablet, Take 1 tablet (8 mg total) by mouth every 8 (eight) hours as needed for nausea or vomiting., Disp: 30 tablet, Rfl: 0  Observations/Objective: Patient is well-developed, well-nourished in no acute distress.  On a walk Head is normocephalic, atraumatic.  No labored breathing.  Speech is clear and coherent with logical content.  Patient is alert and oriented at baseline.    Assessment and Plan: 1. Acute vaginitis Given possibility  of pregnancy I advised a face-to-face visit to confirm or r/o before proceeding Alternatively she could get a home pregnancy test and, if negative, call back.  Follow Up Instructions: I discussed the assessment and treatment plan with the patient. The patient was provided an opportunity to ask questions and all were answered. The patient agreed with the plan and demonstrated an understanding of the instructions.  A copy of instructions were sent to the patient via MyChart.  The patient was advised to call back or seek an in-person evaluation if the symptoms worsen or if the condition fails to improve as anticipated.  Time:  I spent 10 minutes with the patient via telehealth technology discussing the above problems/concerns.    Freeman Caldron, PA-C

## 2020-12-06 ENCOUNTER — Inpatient Hospital Stay (HOSPITAL_COMMUNITY)
Admission: AD | Admit: 2020-12-06 | Discharge: 2020-12-06 | Disposition: A | Discharge status: Home / Self Care | Payer: Medicaid Other | Attending: Obstetrics & Gynecology | Admitting: Obstetrics & Gynecology

## 2020-12-06 ENCOUNTER — Encounter (HOSPITAL_COMMUNITY): Payer: Self-pay

## 2020-12-06 ENCOUNTER — Other Ambulatory Visit: Payer: Self-pay

## 2020-12-06 ENCOUNTER — Inpatient Hospital Stay (HOSPITAL_COMMUNITY): Payer: Medicaid Other

## 2020-12-06 DIAGNOSIS — O98811 Other maternal infectious and parasitic diseases complicating pregnancy, first trimester: Secondary | ICD-10-CM | POA: Insufficient documentation

## 2020-12-06 DIAGNOSIS — O99331 Smoking (tobacco) complicating pregnancy, first trimester: Secondary | ICD-10-CM | POA: Diagnosis not present

## 2020-12-06 DIAGNOSIS — Z3A01 Less than 8 weeks gestation of pregnancy: Secondary | ICD-10-CM | POA: Diagnosis not present

## 2020-12-06 DIAGNOSIS — O3680X0 Pregnancy with inconclusive fetal viability, not applicable or unspecified: Secondary | ICD-10-CM

## 2020-12-06 DIAGNOSIS — O09291 Supervision of pregnancy with other poor reproductive or obstetric history, first trimester: Secondary | ICD-10-CM | POA: Insufficient documentation

## 2020-12-06 DIAGNOSIS — F17210 Nicotine dependence, cigarettes, uncomplicated: Secondary | ICD-10-CM | POA: Insufficient documentation

## 2020-12-06 DIAGNOSIS — O209 Hemorrhage in early pregnancy, unspecified: Secondary | ICD-10-CM | POA: Diagnosis not present

## 2020-12-06 DIAGNOSIS — B3731 Acute candidiasis of vulva and vagina: Secondary | ICD-10-CM

## 2020-12-06 DIAGNOSIS — A5901 Trichomonal vulvovaginitis: Secondary | ICD-10-CM | POA: Insufficient documentation

## 2020-12-06 DIAGNOSIS — O23591 Infection of other part of genital tract in pregnancy, first trimester: Secondary | ICD-10-CM | POA: Diagnosis present

## 2020-12-06 DIAGNOSIS — O98311 Other infections with a predominantly sexual mode of transmission complicating pregnancy, first trimester: Secondary | ICD-10-CM | POA: Diagnosis not present

## 2020-12-06 DIAGNOSIS — B373 Candidiasis of vulva and vagina: Secondary | ICD-10-CM | POA: Insufficient documentation

## 2020-12-06 LAB — CBC
HCT: 36 % (ref 36.0–46.0)
Hemoglobin: 12.2 g/dL (ref 12.0–15.0)
MCH: 30.4 pg (ref 26.0–34.0)
MCHC: 33.9 g/dL (ref 30.0–36.0)
MCV: 89.8 fL (ref 80.0–100.0)
Platelets: 155 10*3/uL (ref 150–400)
RBC: 4.01 MIL/uL (ref 3.87–5.11)
RDW: 13.9 % (ref 11.5–15.5)
WBC: 5.9 10*3/uL (ref 4.0–10.5)
nRBC: 0 % (ref 0.0–0.2)

## 2020-12-06 LAB — WET PREP, GENITAL
Clue Cells Wet Prep HPF POC: NONE SEEN
Sperm: NONE SEEN
Trich, Wet Prep: NONE SEEN

## 2020-12-06 LAB — URINALYSIS, ROUTINE W REFLEX MICROSCOPIC
Bilirubin Urine: NEGATIVE
Glucose, UA: NEGATIVE mg/dL
Ketones, ur: NEGATIVE mg/dL
Nitrite: POSITIVE — AB
Protein, ur: NEGATIVE mg/dL
Specific Gravity, Urine: 1.016 (ref 1.005–1.030)
pH: 5 (ref 5.0–8.0)

## 2020-12-06 LAB — POCT PREGNANCY, URINE: Preg Test, Ur: POSITIVE — AB

## 2020-12-06 LAB — HCG, QUANTITATIVE, PREGNANCY: hCG, Beta Chain, Quant, S: 1771 m[IU]/mL — ABNORMAL HIGH (ref <5)

## 2020-12-06 MED ORDER — TERCONAZOLE 0.4 % VA CREA: 1.0000 | TOPICAL_CREAM | Freq: Every day | VAGINAL | 0 refills | Status: DC

## 2020-12-06 MED ORDER — METRONIDAZOLE 500 MG PO TABS
2000.0000 mg | ORAL_TABLET | Freq: Once | ORAL | Status: AC
  Administered 2020-12-06: 17:00:00 2000 mg via ORAL
  Filled 2020-12-06: qty 4

## 2020-12-06 NOTE — MAU Note (Signed)
Veronica Jones is a 30 y.o. at [redacted]w[redacted]d here in MAU reporting: for 4 days has been having vaginal discomfort. First day had some pain but since then it has just been itchy. Having some brown discharge when she wipes. No pain.  LMP: 10/07/20  Onset of complaint: 4 days  Pain score: 0/10  Vitals:   12/06/20 1548  BP: (!) 140/96  Pulse: 93  Resp: 16  Temp: 98.6 F (37 C)  SpO2: 100%     Lab orders placed from triage: upt, ua

## 2020-12-06 NOTE — MAU Provider Note (Signed)
History     CSN: [id number]  Arrival date and time: 12/06/20 1518   Event Date/Time   First Provider Initiated Contact with Patient 12/06/20 1601      Chief Complaint  Patient presents with   Vaginal Discharge   Vaginal Itching   HPI Veronica Jones is a 30 y.o. G3P1011 at [redacted]w[redacted]d who presents with vaginal irritation. Reports vaginal burning and irritation. Symptoms started 4 days ago. Hasn't treated symptoms. Is on her last day of antibiotics for a dental infection. Denies changes in soap or detergent. Does not douche. Reports brown spotting 4 days ago. Otherwise denies vaginal bleeding. Denies abdominal pain. Very concerned due to history of ectopic pregnancy last year.   OB History     Gravida  3   Para  1   Term  1   Preterm      AB  1   Living  1      SAB      IAB      Ectopic  1   Multiple  0   Live Births  1           Past Medical History:  Diagnosis Date   Chlamydia 2013   Postpartum eclampsia 04/19/2015    Past Surgical History:  Procedure Laterality Date   NO PAST SURGERIES      Family History  Problem Relation Age of Onset   Hypertension Paternal Grandmother    Cancer Paternal Grandmother        breast   Diabetes Paternal Grandmother    Hypertension Mother    Hypertension Father    Diabetes Maternal Aunt     Social History   Tobacco Use   Smoking status: Some Days    Packs/day: 0.50    Years: 1.00    Pack years: 0.50    Types: Cigarettes    Last attempt to quit: 08/03/2014    Years since quitting: 6.3   Smokeless tobacco: Never  Vaping Use   Vaping Use: Never used  Substance Use Topics   Alcohol use: No    Alcohol/week: 0.0 standard drinks    Comment: not since pregnancy   Drug use: Yes    Types: Marijuana    Comment: 1 week asgo    Allergies:  Allergies  Allergen Reactions   Amoxicillin Hives   Penicillins Hives    Has patient had a PCN reaction causing immediate rash, facial/tongue/throat swelling, SOB or  lightheadedness with hypotension: No Has patient had a PCN reaction causing severe rash involving mucus membranes or skin necrosis: No Has patient had a PCN reaction that required hospitalization No Has patient had a PCN reaction occurring within the last 10 years: No If all of the above answers are "NO", then may proceed with Cephalosporin use.     No medications prior to admission.    Review of Systems  Constitutional: Negative.   Gastrointestinal: Negative.   Genitourinary:  Positive for vaginal discharge and vaginal pain. Negative for vaginal bleeding.  Physical Exam   Blood pressure (!) 142/70, pulse 89, temperature 98.6 F (37 C), temperature source Oral, resp. rate 14, height 5\' 4"  (1.626 m), weight 74.8 kg, last menstrual period 11/06/2020, SpO2 100 %, unknown if currently breastfeeding.  Physical Exam Vitals and nursing note reviewed.  Constitutional:      General: She is not in acute distress.    Appearance: Normal appearance.  HENT:     Head: Normocephalic and atraumatic.  Eyes:  General: No scleral icterus. Pulmonary:     Effort: Pulmonary effort is normal. No respiratory distress.  Abdominal:     General: Abdomen is flat. There is no distension.     Palpations: Abdomen is soft.     Tenderness: There is no abdominal tenderness.  Genitourinary:    Vagina: Vaginal discharge (thick white discharge) and erythema present.  Skin:    General: Skin is warm and dry.  Neurological:     Mental Status: She is alert.  Psychiatric:        Mood and Affect: Mood normal.        Behavior: Behavior normal.    MAU Course  Procedures Results for orders placed or performed during the hospital encounter of 12/06/20 (from the past 24 hour(s))  Pregnancy, urine POC     Status: Abnormal   Collection Time: 12/06/20  3:43 PM  Result Value Ref Range   Preg Test, Ur POSITIVE (A) NEGATIVE  Urinalysis, Routine w reflex microscopic Urine, Clean Catch     Status: Abnormal    Collection Time: 12/06/20  4:01 PM  Result Value Ref Range   Color, Urine YELLOW YELLOW   APPearance CLOUDY (A) CLEAR   Specific Gravity, Urine 1.016 1.005 - 1.030   pH 5.0 5.0 - 8.0   Glucose, UA NEGATIVE NEGATIVE mg/dL   Hgb urine dipstick SMALL (A) NEGATIVE   Bilirubin Urine NEGATIVE NEGATIVE   Ketones, ur NEGATIVE NEGATIVE mg/dL   Protein, ur NEGATIVE NEGATIVE mg/dL   Nitrite POSITIVE (A) NEGATIVE   Leukocytes,Ua LARGE (A) NEGATIVE   RBC / HPF 0-5 0 - 5 RBC/hpf   WBC, UA 11-20 0 - 5 WBC/hpf   Bacteria, UA MANY (A) NONE SEEN   Squamous Epithelial / LPF 6-10 0 - 5   Mucus PRESENT    Trichomonas, UA PRESENT (A) NONE SEEN  Wet prep, genital     Status: Abnormal   Collection Time: 12/06/20  4:20 PM   Specimen: PATH Cytology Cervicovaginal Ancillary Only  Result Value Ref Range   Yeast Wet Prep HPF POC PRESENT (A) NONE SEEN   Trich, Wet Prep NONE SEEN NONE SEEN   Clue Cells Wet Prep HPF POC NONE SEEN NONE SEEN   WBC, Wet Prep HPF POC MANY (A) NONE SEEN   Sperm NONE SEEN   CBC     Status: None   Collection Time: 12/06/20  4:22 PM  Result Value Ref Range   WBC 5.9 4.0 - 10.5 K/uL   RBC 4.01 3.87 - 5.11 MIL/uL   Hemoglobin 12.2 12.0 - 15.0 g/dL   HCT 76.5 46.5 - 03.5 %   MCV 89.8 80.0 - 100.0 fL   MCH 30.4 26.0 - 34.0 pg   MCHC 33.9 30.0 - 36.0 g/dL   RDW 46.5 68.1 - 27.5 %   Platelets 155 150 - 400 K/uL   nRBC 0.0 0.0 - 0.2 %  hCG, quantitative, pregnancy     Status: Abnormal   Collection Time: 12/06/20  4:22 PM  Result Value Ref Range   hCG, Beta Chain, Quant, S 1,771 (H) <5 mIU/mL   US OB LESS THAN 14 WEEKS WITH OB TRANSVAGINAL  Result Date: 12/06/2020 CLINICAL DATA:  Vaginal bleeding.  Pregnant patient. EXAM: OBSTETRIC <14 WK [place] AND TRANSVAGINAL OB US TECHNIQUE: Both transabdominal and transvaginal ultrasound examinations were performed for complete evaluation of the gestation as well as the maternal uterus, adnexal regions, and pelvic cul-de-sac. Transvaginal technique  was performed to assess early pregnancy.  COMPARISON:  None. FINDINGS: Intrauterine gestational sac: The endometrium is thickened. Within the thickened endometrium is a cluster of hypo echogenic regions, 1 of which is rounded. Yolk sac:  Not Visualized. Embryo:  Not Visualized. MSD: 2.9 mm mm   4 w   6 d CRL:    mm    w    d                  [place] EDC: Subchorionic hemorrhage:  None visualized. Maternal uterus/adnexae: The ovaries are normal. A corpus luteum cyst is seen on the left. Trace fluid in the cul-de-sac. IMPRESSION: 1. There is a cluster of hypoechoic regions, 1 of which is rounded, within the thickened endometrium. This finding is nonspecific. It is unclear whether the rounded region of hypoechogenicity represents a very early gestational sac. No definitive IUP identified. The lack of an IUP in a pregnant patient could be seen with early pregnancy, recent miscarriage, or ectopic pregnancy. Recommend clinical correlation and close follow-up. 2. No other significant abnormalities. Electronically Signed   By: Gerome Sam III M.D   On: 12/06/2020 17:14    MDM +UPT UA, wet prep, GC/chlamydia, CBC, ABO/Rh, quant hCG, and [place] today to rule out ectopic pregnancy which can be life threatening.   Labs & ultrasound performed due to recent history of ectopic pregnancy & patient report of a brown spotting a few days ago. Currently denies symptoms. She is rh positive.   Wet prep positive for yeast which is consistent with her exam. Will rx terazol  U/a shows nitrites & trichomonas. No urinary complaints - will send urine for culture. Flagyl 2 gm PO given in MAU.   Ultrasound today shows no IUP or adnexal mass. HCG is 1771. Pt is asymptomatic. Will have her return to the office for stat hcg.  Assessment and Plan   1. Pregnancy of unknown anatomic location  -scheduled at [facility] on Wednesday for stat HCG -reviewed ectopic precautions  2. Vaginal bleeding in pregnancy, first trimester  -rh  positive -currently no bleeding  3. Vaginal yeast infection  -rx terazol  4. Trichomonal vaginitis during pregnancy in first trimester  -given flagyl in MAU -GC/CT pending -no intercourse x 2 week after treatment -partner to go to [facility] for tx & STI testing  5. [redacted] weeks gestation of pregnancy      Judeth Horn 12/06/2020, 6:32 PM

## 2020-12-06 NOTE — Discharge Instructions (Signed)
Return to care  If you have heavier bleeding that soaks through more that 2 pads per hour for an hour or more If you bleed so much that you feel like you might pass out or you do pass out If you have significant abdominal pain that is not improved with Tylenol   

## 2020-12-07 LAB — GC/CHLAMYDIA PROBE AMP (~~LOC~~) NOT AT ARMC
Chlamydia: NEGATIVE
Comment: NEGATIVE
Comment: NORMAL
Neisseria Gonorrhea: NEGATIVE

## 2020-12-09 ENCOUNTER — Other Ambulatory Visit: Payer: Self-pay

## 2020-12-09 ENCOUNTER — Other Ambulatory Visit (INDEPENDENT_AMBULATORY_CARE_PROVIDER_SITE_OTHER): Payer: Medicaid Other

## 2020-12-09 VITALS — BP 131/77 | HR 74

## 2020-12-09 DIAGNOSIS — O3680X0 Pregnancy with inconclusive fetal viability, not applicable or unspecified: Secondary | ICD-10-CM

## 2020-12-09 LAB — CULTURE, OB URINE: Culture: >=100000 — AB

## 2020-12-09 LAB — BETA HCG QUANT (REF LAB): hCG Quant: 3660 m[IU]/mL

## 2020-12-09 NOTE — Progress Notes (Signed)
Veronica Jones presents to [facility] for follow-up HCG blood draw today. She was seen in MAU on 12/06/20 for spotting during early pregnancy and history of ectopic pregnancy. Patient denies pain or bleeding today. Discussed with patient, we are following hCG levels today. Results will be back in approximately 2 hours. Valid contact number for patient confirmed. I will call the patient with results. Patient is tearful during visit. Brief discussion about emotional wellbeing during this process. Pt expresses this has been difficult due to prior ectopic pregnancy and ongoing stress due to current relationship. Pt interested in follow up appt with [facility]. Front office notified to schedule.  Result today is 3660, which has risen from 1771 on 12/09/20. Results and patient history reviewed with Tinnie Gens, MD, who recommends follow-up stat beta HCG on 12/11/20 AM and [place] 7-10 days from initial Korea. Patient called and informed of plan for follow-up. Pt to return Friday for nurse visit and 12/15/20 for [place] at 3PM; pt instructed to arrive at 2:45 PM with a full bladder.   Marjo Bicker 12/09/2020 9:48 AM

## 2020-12-09 NOTE — Progress Notes (Signed)
Patient seen and assessed by nursing staff.  Agree with documentation and plan.  

## 2020-12-10 ENCOUNTER — Other Ambulatory Visit: Payer: Self-pay | Admitting: Family Medicine

## 2020-12-10 DIAGNOSIS — N390 Urinary tract infection, site not specified: Secondary | ICD-10-CM

## 2020-12-10 MED ORDER — FOSFOMYCIN TROMETHAMINE 3 G PO PACK: 3.0000 g | PACK | Freq: Once | ORAL | 0 refills | Status: AC

## 2020-12-10 NOTE — BH Specialist Note (Signed)
Integrated Behavioral Health via Telemedicine Visit  12/10/2020 Veronica Jones [id number]  Number of Integrated Behavioral Health visits: 1 Session Start time: 9:25  Session End time: 10:00 Total time: 35   Referring Provider: Raynelle Dick, MD Patient/Family location: Home [facility] Provider location: [facility]  All persons participating in visit: [name] and [facility] Veronica Jones   Types of Service: Telephone visit  I connected with Veronica Jones and/or Veronica Jones  n/a  via  Telephone or Engineer, civil (consulting)  (Video is Caregility application) and verified that I am speaking with the correct person using two identifiers. Discussed confidentiality: Yes   I discussed the limitations of telemedicine and the availability of in person appointments.  Discussed there is a possibility of technology failure and discussed alternative modes of communication if that failure occurs.  I discussed that engaging in this telemedicine visit, they consent to the provision of behavioral healthcare and the services will be billed under their insurance.  Patient and/or legal guardian expressed understanding and consented to Telemedicine visit: Yes   Presenting Concerns: Patient and/or family reports the following symptoms/concerns: Pt states her primary concern today is nausea, along with stress and anxious thoughts in pregnancy related to previous ectopic pregnancy and relationship discord after trichomonal vaginitis diagnosis.  Duration of problem: Current pregnancy; Severity of problem: moderate  Patient and/or Family's Strengths/Protective Factors: Sense of purpose  Goals Addressed: Patient will:  Reduce symptoms of: anxiety and stress   Increase knowledge and/or ability of: healthy habits and stress reduction   Demonstrate ability to: Increase healthy adjustment to current life circumstances  Progress towards  Goals: Ongoing  Interventions: Interventions utilized:  Solution-Focused Strategies and Psychoeducation and/or Health Education Standardized Assessments completed: Not Needed  Patient and/or Family Response: Pt agrees with treatment plan  Assessment: Patient 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   Patient may benefit from psychoeducation and brief therapeutic interventions regarding coping with symptoms of anxiety and life stress .  Plan: Follow up with behavioral health clinician on : One month Behavioral recommendations:  -Begin taking prenatal vitamin as recommended by medical provider -Continue prioritizing healthy self-Jones regarding sleeping and eating during pregnancy -Consider discussing use of maternity belt later in pregnancy to help with discomfort at work, as pregnancy progresses -Discuss concerns regarding origin of trichomonal vaginitis with medical provider at initial pregnancy appontment -Continue using sleep sounds for as long as remains helpful for improved family sleep Referral(s): Integrated [employer] (In Clinic)  I discussed the assessment and treatment plan with the patient and/or parent/guardian. They were provided an opportunity to ask questions and all were answered. They agreed with the plan and demonstrated an understanding of the instructions.   They were advised to call back or seek an in-person evaluation if the symptoms worsen or if the condition fails to improve as anticipated.  Rae Lips, LCSW  Depression screen [facility] 2/9 12/11/2020 12/09/2020 03/19/2020 02/28/2020  Decreased Interest 1 1 2 1   Down, Depressed, Hopeless 0 0 3 0  PHQ - 2 Score 1 1 5 1   Altered sleeping 0 0 0 0  Tired, decreased energy 1 1 0 1  Change in appetite 0 0 0 0  Feeling bad or failure about yourself  0 1 3 0  Trouble concentrating 0 0 2 0  Moving slowly or fidgety/restless 0 0 1 0  Suicidal thoughts 0 0 1 0  PHQ-9 Score 2 3 12  2  Difficult doing work/chores - Somewhat difficult - -   GAD 7 : Generalized Anxiety Score 12/11/2020 03/19/2020 02/28/2020  Nervous, Anxious, on Edge 0 1 0  Control/stop worrying 0 3 0  Worry too much - different things 1 3 1   Trouble relaxing 0 1 0  Restless 0 3 0  Easily annoyed or irritable 1 0 1  Afraid - awful might happen 0 1 0  Total GAD 7 Score 2 12 2

## 2020-12-10 NOTE — Progress Notes (Signed)
Treatment for urine culture sent. Sensitivities are primarily to penicillins and cephalosporins, which unfortunately the patient is allergic to with listed reaction of hives.  Will send single dose Fosfomycin.

## 2020-12-11 ENCOUNTER — Other Ambulatory Visit: Payer: Self-pay

## 2020-12-11 ENCOUNTER — Other Ambulatory Visit (INDEPENDENT_AMBULATORY_CARE_PROVIDER_SITE_OTHER): Payer: Medicaid Other

## 2020-12-11 VITALS — BP 123/83 | HR 76 | Wt 166.4 lb

## 2020-12-11 DIAGNOSIS — O3680X0 Pregnancy with inconclusive fetal viability, not applicable or unspecified: Secondary | ICD-10-CM

## 2020-12-11 LAB — BETA HCG QUANT (REF LAB): hCG Quant: 5783 m[IU]/mL

## 2020-12-11 NOTE — Progress Notes (Signed)
Veronica Jones presents to [facility] for follow-up HCG blood draw today. Patient was last seen in our office on 12/09/20 following visit to MAU for vaginal spotting during early pregnancy. Patient denies pain or bleeding today. Discussed with patient, we are following HCG levels today. Results will be back in approximately 2 hours. Valid contact number for patient confirmed. I will call the patient with results.   HCG result today is 5783, which has risen from 3660 in approx. 48 hours. Results and patient history reviewed with Crissie Reese, MD. Patient called and informed of plan for follow-up. Pt will return for [place] 12/15/20.  Marjo Bicker 12/11/2020 8:31 AM

## 2020-12-12 NOTE — Progress Notes (Signed)
Chart reviewed for nurse visit. Agree with plan of care.   Brithney Bensen M, MD 12/12/20 8:19 AM 

## 2020-12-15 ENCOUNTER — Ambulatory Visit
Admission: RE | Admit: 2020-12-15 | Discharge: 2020-12-15 | Disposition: A | Discharge status: Home / Self Care | Payer: Medicaid Other | Source: Ambulatory Visit | Attending: Family Medicine | Admitting: Family Medicine

## 2020-12-15 ENCOUNTER — Other Ambulatory Visit: Payer: Self-pay

## 2020-12-15 DIAGNOSIS — O3680X0 Pregnancy with inconclusive fetal viability, not applicable or unspecified: Secondary | ICD-10-CM | POA: Insufficient documentation

## 2020-12-16 ENCOUNTER — Telehealth: Payer: Self-pay | Admitting: Medical

## 2020-12-16 NOTE — Telephone Encounter (Signed)
I called [name] today at 11:06 AM and confirmed patient's identity using two patient identifiers. [place] results from yesterday were reviewed. Patient is not yet scheduled for new OB visit. Discussed [facility] offices proximal to the patient. She plan to return to [facility]. In-basket message sent to [facility] admin pool to schedule in 4-5 weeks for new OB with intake. Patient advised to start PNV. First trimester warning signs reviewed. Patient voiced understanding and had no further questions.   US OB Transvaginal  Result Date: 12/15/2020 CLINICAL DATA:  30 year old pregnant female of unknown anatomic location. EDC by LMP: 08/13/2021, projecting to an expected gestational age of [redacted] weeks 4 days. Quantitative beta HCG unavailable. No acute symptoms reported. EXAM: TRANSVAGINAL OB ULTRASOUND TECHNIQUE: Transvaginal ultrasound was performed for complete evaluation of the gestation as well as the maternal uterus, adnexal regions, and pelvic cul-de-sac. COMPARISON:  12/06/2020 obstetric scan. FINDINGS: Intrauterine gestational sac: Single Yolk sac:  Visualized. Embryo:  Visualized. Cardiac Activity: Visualized. Heart Rate: 120 bpm CRL:   2.6 mm   5 w 6 d                  [place] EDC: 08/11/2021 Subchorionic hemorrhage:  None visualized. Maternal uterus/adnexae: Anteverted uterus with no uterine fibroids. Right ovary measures 2.8 x 1.6 x 2.4 cm. Left ovary measures 3.4 x 2.4 x 2.8 cm and contains a corpus luteum. No suspicious ovarian or adnexal masses. No abnormal free fluid in the pelvis. IMPRESSION: Single living intrauterine gestation at 5 weeks 6 days by crown-rump length, concordant with provided menstrual dating. No acute first-trimester gestational abnormality. Electronically Signed   By: Delbert Phenix M.D.   On: 12/15/2020 16:04    Marny Lowenstein, PA-C 12/16/2020 11:06 AM

## 2020-12-23 ENCOUNTER — Ambulatory Visit (INDEPENDENT_AMBULATORY_CARE_PROVIDER_SITE_OTHER): Payer: Medicaid Other | Admitting: Clinical

## 2020-12-23 DIAGNOSIS — F4322 Adjustment disorder with anxiety: Secondary | ICD-10-CM

## 2020-12-23 DIAGNOSIS — O99340 Other mental disorders complicating pregnancy, unspecified trimester: Secondary | ICD-10-CM | POA: Diagnosis not present

## 2020-12-23 DIAGNOSIS — Z3A00 Weeks of gestation of pregnancy not specified: Secondary | ICD-10-CM | POA: Diagnosis not present

## 2020-12-23 NOTE — Patient Instructions (Addendum)
[facility] [address] [place], [place] [address] [phone] (main office) [phone] [facility] office)  [email or web address] (virtual tour of [facility]; register for childbirth classes, etc.)  /Emotional Wellbeing Apps and Websites Here are a few free apps meant to help you to help yourself.  To find, try searching on the internet to see if the app is offered on Apple/Android devices. If your first choice doesn't come up on your device, the good news is that there are many choices! Play around with different apps to see which ones are helpful to you.    Calm This is an app meant to help increase calm feelings. Includes info, strategies, and tools for tracking your feelings.      Calm Harm  This app is meant to help with self-harm. Provides many 5-minute or 15-min coping strategies for doing instead of hurting yourself.       Healthy Minds Health Minds is a problem-solving tool to help deal with emotions and cope with stress you encounter wherever you are.      MindShift This app can help people cope with anxiety. Rather than trying to avoid anxiety, you can make an important shift and face it.      MY3  MY3 features a support system, safety plan and resources with the goal of offering a tool to use in a time of need.       My Life My Voice  This mood journal offers a simple solution for tracking your thoughts, feelings and moods. Animated emoticons can help identify your mood.       Relax Melodies Designed to help with sleep, on this app you can mix sounds and meditations for relaxation.      Smiling Mind Smiling Mind is meditation made easy: it's a simple tool that helps put a smile on your mind.        Stop, Breathe & Think  A friendly, simple guide for people through meditations for mindfulness and compassion.  Stop, Breathe and Think Kids Enter your current feelings and choose a  "mission" to help you cope. Offers videos for certain moods instead of just sound recordings.       Team Orange The goal of this tool is to help teens change how they think, act, and react. This app helps you focus on your own good feelings and experiences.      The [employer] Box The [employer] Box (VHB) contains simple tools to help patients with coping, relaxation, distraction, and positive thinking.

## 2020-12-24 ENCOUNTER — Other Ambulatory Visit: Payer: Self-pay | Admitting: Medical

## 2020-12-24 DIAGNOSIS — O219 Vomiting of pregnancy, unspecified: Secondary | ICD-10-CM

## 2020-12-24 MED ORDER — METOCLOPRAMIDE HCL 10 MG PO TABS: 10.0000 mg | ORAL_TABLET | Freq: Four times a day (QID) | ORAL | 2 refills | Status: DC | PRN

## 2020-12-28 ENCOUNTER — Telehealth (INDEPENDENT_AMBULATORY_CARE_PROVIDER_SITE_OTHER): Payer: Medicaid Other

## 2020-12-28 DIAGNOSIS — Z3490 Encounter for supervision of normal pregnancy, unspecified, unspecified trimester: Secondary | ICD-10-CM | POA: Insufficient documentation

## 2020-12-28 DIAGNOSIS — Z3A00 Weeks of gestation of pregnancy not specified: Secondary | ICD-10-CM

## 2020-12-28 MED ORDER — GOJJI WEIGHT SCALE MISC: 1.0000 | 0 refills | Status: DC | PRN

## 2020-12-28 MED ORDER — BLOOD PRESSURE KIT DEVI: 1.0000 | 0 refills | Status: DC | PRN

## 2020-12-28 NOTE — Patient Instructions (Signed)
-  [facility]- to pick up blood pressure cuff and weight scale [address] [place], [place]  Safe Medications in Pregnancy   Acne:  Benzoyl Peroxide  Salicylic Acid   Backache/Headache:  Tylenol: 2 regular strength every 4 hours OR               2 Extra strength every 6 hours   Colds/Coughs/Allergies:  Benadryl (alcohol free) 25 mg every 6 hours as needed  Breath right strips  Claritin  Cepacol throat lozenges  Chloraseptic throat spray  Cold-Eeze- up to three times per day  Cough drops, alcohol free  Flonase (by prescription only)  Guaifenesin  Mucinex  Robitussin DM (plain only, alcohol free)  Saline nasal spray/drops  Sudafed (pseudoephedrine) & Actifed * use only after [redacted] weeks gestation and if you do not have high blood pressure  Tylenol  Vicks Vaporub  Zinc lozenges  Zyrtec   Constipation:  Colace  Ducolax suppositories  Fleet enema  Glycerin suppositories  Metamucil  Milk of magnesia  Miralax  Senokot  Smooth move tea   Diarrhea:  Kaopectate  Imodium A-D   *NO pepto Bismol   Hemorrhoids:  Anusol  Anusol HC  Preparation H  Tucks   Indigestion:  Tums  Maalox  Mylanta  Zantac  Pepcid   Insomnia:  Benadryl (alcohol free) 25mg  every 6 hours as needed  Tylenol PM  Unisom, no Gelcaps   Leg Cramps:  Tums  MagGel   Nausea/Vomiting:  Bonine  Dramamine  Emetrol  Ginger extract  Sea bands  Meclizine  Nausea medication to take during pregnancy:  Unisom (doxylamine succinate 25 mg tablets) Take one tablet daily at bedtime. If symptoms are not adequately controlled, the dose can be increased to a maximum recommended dose of two tablets daily (1/2 tablet in the morning, 1/2 tablet mid-afternoon and one at bedtime).  Vitamin B6 100mg  tablets. Take one tablet twice a day (up to 200 mg per day).   Skin Rashes:  Aveeno products  Benadryl cream or 25mg  every 6 hours as needed  Calamine Lotion  1% cortisone cream   Yeast infection:   Gyne-lotrimin 7  Monistat 7    **If taking multiple medications, please check labels to avoid duplicating the same active ingredients  **take medication as directed on the label  ** Do not exceed 4000 mg of tylenol in 24 hours  **Do not take medications that contain aspirin or ibuprofen

## 2020-12-28 NOTE — Progress Notes (Signed)
New OB Intake  I connected with  Veronica Jones on 12/28/20 at  1:15 PM EDT by MyChart Video Visit and verified that I am speaking with the correct person using two identifiers. Nurse is located at [facility] and pt is located at work.  I discussed the limitations, risks, security and privacy concerns of performing an evaluation and management service by telephone and the availability of in person appointments. I also discussed with the patient that there may be a patient responsible charge related to this service. The patient expressed understanding and agreed to proceed.  I explained I am completing New OB Intake today. We discussed her EDD of 08/13/2021 that is based on LMP of 11/06/2020. Pt is G3/P1. I reviewed her allergies, medications, Medical/Surgical/OB history, and appropriate screenings. I informed her of [facility] services. Based on history, this is a/an  pregnancy uncomplicated .   Patient Active Problem List   Diagnosis Date Noted   Trichomonal vaginitis during pregnancy in first trimester 12/06/2020   Ectopic pregnancy, tubal 03/12/2020    Concerns addressed today  Delivery Plans:  Plans to deliver at [facility].   MyChart/Babyscripts MyChart access verified. I explained pt will have some visits in office and some virtually. Babyscripts instructions given and order placed. Patient verifies receipt of registration text/e-mail. Account successfully created and app downloaded.  Blood Pressure Cuff  Blood pressure cuff ordered for patient to pick-up from [employer]. Explained after first prenatal appt pt will check weekly and document in Babyscripts.  Weight scale: Weight scale ordered for patient to pick up form [facility].   Anatomy US Explained first scheduled [place] will be around 19 weeks. Anatomy US scheduled for October 3 at 1430.   Labs Discussed Avelina Laine genetic screening with patient. Would like both Panorama and Horizon drawn at new OB visit. Routine prenatal labs  needed.  Covid Vaccine Patient has covid vaccine.   Mother/ Baby Dyad Candidate?    If yes, offer as possibility  Informed patient of [facility] website  and placed link in her AVS.   Social Determinants of Health Food Insecurity: Patient denies food insecurity. WIC Referral: Patient is interested in referral to [facility].  Transportation: Patient denies transportation needs. Childcare: Discussed no children allowed at ultrasound appointments. Offered childcare services; patient declines childcare services at this time.   Placed OB Box on problem list and updated  First visit review I reviewed new OB appt with pt. I explained she will have a pelvic exam, ob bloodwork with genetic screening, and PAP smear. Explained pt will be seen by Donavan Foil, MD at first visit; encounter routed to appropriate provider. Explained that patient will be seen by pregnancy navigator following visit with provider. [facility] information placed in AVS.   Ralene Bathe, RN 12/28/2020  1:42 PM

## 2020-12-31 NOTE — Progress Notes (Signed)
Patient was assessed and managed by nursing staff during this encounter. I have reviewed the chart and agree with the documentation and plan. I have also made any necessary editorial changes.  Warden Fillers, MD 12/31/2020 3:06 PM

## 2021-01-13 NOTE — BH Specialist Note (Signed)
Integrated Behavioral Health via Telemedicine Visit  01/13/2021 Halaina Vanduzer [id number]  Number of Integrated Behavioral Health visits: 2 Session Start time: 9:17  Session End time: 9:31 Total time:  14  Referring Provider: Raynelle Dick, MD Patient/Family location: [facility] Provider location: [facility] for [facility]  All persons participating in visit: [name] and [facility] Elesa Garman   Types of Service: Individual psychotherapy and Video visit  I connected with [name] and/or Donnesha Warga's  n/a  via  Telephone or Video Enabled Telemedicine Application  (Video is Caregility application) and verified that I am speaking with the correct person using two identifiers. Discussed confidentiality: Yes   I discussed the limitations of telemedicine and the availability of in person appointments.  Discussed there is a possibility of technology failure and discussed alternative modes of communication if that failure occurs.  I discussed that engaging in this telemedicine visit, they consent to the provision of behavioral healthcare and the services will be billed under their insurance.  Patient and/or legal guardian expressed understanding and consented to Telemedicine visit: Yes   Presenting Concerns: Patient and/or family reports the following symptoms/concerns: Pt states her primary concern today is feeling as if she's gaining too much weight in pregnancy (+17lbs), but "feeling happier", less anxious, relationship with FOB much-improved, and nausea improving with medication.  Duration of problem: Current pregnancy; Severity of problem: mild  Patient and/or Family's Strengths/Protective Factors: Social connections and Sense of purpose  Goals Addressed: Patient will:  Maintain reduction in symptoms of: anxiety and stress   Demonstrate ability to: Increase healthy adjustment to current life circumstances  Progress towards  Goals: Achieved  Interventions: Interventions utilized:  Psychoeducation and/or Health Education and Supportive Reflection Standardized Assessments completed: Not Needed  Patient and/or Family Response: Pt agrees with treatment plan  Assessment: Patient currently experiencing Adjustment disorder with anxious mood.   Patient may benefit from continued psychoeducation and brief therapeutic interventions regarding maintaining reduction of symptoms of anxiety and life stress today .  Plan: Follow up with behavioral health clinician on : Call Asher Muir as needed at [phone] Behavioral recommendations:  -Continue taking prenatal vitamin daily, as recommended by medical provider -Continue prioritizing healthy self-care throughout pregnancy -Continue with plan to obtain batteries needed for BP cuff Referral(s): Integrated [employer] (In Clinic)  I discussed the assessment and treatment plan with the patient and/or parent/guardian. They were provided an opportunity to ask questions and all were answered. They agreed with the plan and demonstrated an understanding of the instructions.   They were advised to call back or seek an in-person evaluation if the symptoms worsen or if the condition fails to improve as anticipated.  Valetta Close Tansy Lorek, LCSW

## 2021-01-20 ENCOUNTER — Other Ambulatory Visit: Payer: Self-pay

## 2021-01-20 ENCOUNTER — Inpatient Hospital Stay (HOSPITAL_COMMUNITY)
Admission: AD | Admit: 2021-01-20 | Discharge: 2021-01-20 | Disposition: A | Discharge status: Home / Self Care | Payer: Medicaid Other | Attending: Obstetrics & Gynecology | Admitting: Obstetrics & Gynecology

## 2021-01-20 ENCOUNTER — Telehealth: Payer: Self-pay | Admitting: *Deleted

## 2021-01-20 DIAGNOSIS — Z880 Allergy status to penicillin: Secondary | ICD-10-CM | POA: Insufficient documentation

## 2021-01-20 DIAGNOSIS — O99321 Drug use complicating pregnancy, first trimester: Secondary | ICD-10-CM | POA: Diagnosis not present

## 2021-01-20 DIAGNOSIS — Z87891 Personal history of nicotine dependence: Secondary | ICD-10-CM | POA: Insufficient documentation

## 2021-01-20 DIAGNOSIS — F1290 Cannabis use, unspecified, uncomplicated: Secondary | ICD-10-CM | POA: Diagnosis not present

## 2021-01-20 DIAGNOSIS — R112 Nausea with vomiting, unspecified: Secondary | ICD-10-CM

## 2021-01-20 DIAGNOSIS — O219 Vomiting of pregnancy, unspecified: Secondary | ICD-10-CM | POA: Insufficient documentation

## 2021-01-20 DIAGNOSIS — O210 Mild hyperemesis gravidarum: Secondary | ICD-10-CM

## 2021-01-20 DIAGNOSIS — Z8249 Family history of ischemic heart disease and other diseases of the circulatory system: Secondary | ICD-10-CM | POA: Diagnosis not present

## 2021-01-20 DIAGNOSIS — Z3A10 10 weeks gestation of pregnancy: Secondary | ICD-10-CM | POA: Diagnosis not present

## 2021-01-20 LAB — URINALYSIS, ROUTINE W REFLEX MICROSCOPIC
Bilirubin Urine: NEGATIVE
Glucose, UA: NEGATIVE mg/dL
Hgb urine dipstick: NEGATIVE
Ketones, ur: NEGATIVE mg/dL
Leukocytes,Ua: NEGATIVE
Nitrite: NEGATIVE
Protein, ur: NEGATIVE mg/dL
Specific Gravity, Urine: 1.028 (ref 1.005–1.030)
pH: 5 (ref 5.0–8.0)

## 2021-01-20 MED ORDER — ONDANSETRON 4 MG PO TBDP
8.0000 mg | ORAL_TABLET | Freq: Once | ORAL | Status: AC
  Administered 2021-01-20: 8 mg via ORAL
  Filled 2021-01-20: qty 2

## 2021-01-20 MED ORDER — ONDANSETRON 8 MG PO TBDP: 8.0000 mg | ORAL_TABLET | Freq: Three times a day (TID) | ORAL | 1 refills | Status: DC | PRN

## 2021-01-20 MED ORDER — PROMETHAZINE HCL 25 MG PO TABS: 25.0000 mg | ORAL_TABLET | Freq: Four times a day (QID) | ORAL | 1 refills | Status: DC | PRN

## 2021-01-20 NOTE — MAU Note (Signed)
Veronica Jones is a 30 y.o. at [redacted]w[redacted]d here in MAU reporting: states she has not been able to keep anything down for the past 3 days. Has had 1 episode of vomiting today. States medications she have been Rx are not working.  Onset of complaint: ongoing  Pain score: 0/10  Vitals:   01/20/21 1213  BP: 139/82  Pulse: 76  Resp: 16  Temp: 98.2 F (36.8 C)  SpO2: 100%     FHT:164  Lab orders placed from triage: UA

## 2021-01-20 NOTE — MAU Provider Note (Signed)
History     CSN: [id number]  Arrival date and time: 01/20/21 1134   None     Chief Complaint  Patient presents with   Nausea   Emesis   HPI  [name] is a 30 y.o. female G3P1011 _0  here with N/V. She reports every day vomiting.  She vomited 2x today and 4x yesterday.  She is taking Reglan, however only took one dose yesterday, none today.  She attempted eating a salad yesterday, however vomited that up.   OB History     Gravida  3   Para  1   Term  1   Preterm      AB  1   Living  1      SAB      IAB      Ectopic  1   Multiple  0   Live Births  1           Past Medical History:  Diagnosis Date   Chlamydia 2013   Hx of trichomoniasis    Postpartum eclampsia 04/19/2015    Past Surgical History:  Procedure Laterality Date   NO PAST SURGERIES      Family History  Problem Relation Age of Onset   Hyperlipidemia Mother    Hypertension Mother    Hyperlipidemia Father    Hypertension Father    Diabetes Maternal Aunt    Hypertension Paternal Grandmother    Cancer Paternal Grandmother        breast   Diabetes Paternal Grandmother     Social History   Tobacco Use   Smoking status: Former    Packs/day: 0.50    Years: 1.00    Pack years: 0.50    Types: Cigarettes    Quit date: 10/11/2020    Years since quitting: 0.2   Smokeless tobacco: Never  Vaping Use   Vaping Use: Never used  Substance Use Topics   Alcohol use: Not Currently    Comment: not since pregnancy   Drug use: Yes    Types: Marijuana    Comment: 1 week ago    Allergies:  Allergies  Allergen Reactions   Amoxicillin Hives   Penicillins Hives    Has patient had a PCN reaction causing immediate rash, facial/tongue/throat swelling, SOB or lightheadedness with hypotension: No Has patient had a PCN reaction causing severe rash involving mucus membranes or skin necrosis: No Has patient had a PCN reaction that required hospitalization No Has patient had a PCN  reaction occurring within the last 10 years: No If all of the above answers are "NO", then may proceed with Cephalosporin use.     Medications Prior to Admission  Medication Sig Dispense Refill Last Dose   Blood Pressure Monitoring (BLOOD PRESSURE KIT) DEVI 1 Device by Does not apply route as needed. 1 each 0    metoCLOPramide (REGLAN) 10 MG tablet Take 1 tablet (10 mg total) by mouth every 6 (six) hours as needed for nausea. 30 tablet 2    Misc. Devices (GOJJI WEIGHT SCALE) MISC 1 Device by Does not apply route as needed. 1 each 0    Prenatal Vit-Fe Fumarate-FA (PREPLUS) 27-1 MG TABS Take 1 tablet by mouth daily.      terconazole (TERAZOL 7) 0.4 % vaginal cream Place 1 applicator vaginally at bedtime. Use for seven days (Patient not taking: Reported on 12/28/2020) 45 g 0    Results for orders placed or performed during the hospital encounter of 01/20/21 (from the past  72 hour(s))  Urinalysis, Routine w reflex microscopic Urine, Clean Catch     Status: Abnormal   Collection Time: 01/20/21 12:36 PM  Result Value Ref Range   Color, Urine YELLOW YELLOW   APPearance CLOUDY (A) CLEAR   Specific Gravity, Urine 1.028 1.005 - 1.030   pH 5.0 5.0 - 8.0   Glucose, UA NEGATIVE NEGATIVE mg/dL   Hgb urine dipstick NEGATIVE NEGATIVE   Bilirubin Urine NEGATIVE NEGATIVE   Ketones, ur NEGATIVE NEGATIVE mg/dL   Protein, ur NEGATIVE NEGATIVE mg/dL   Nitrite NEGATIVE NEGATIVE   Leukocytes,Ua NEGATIVE NEGATIVE    Comment: Performed at [facility] [address] [place], [place] [address]    Review of Systems  Constitutional:  Negative for fever.  Gastrointestinal:  Positive for nausea and vomiting.  Genitourinary:  Negative for vaginal bleeding and vaginal discharge.  Physical Exam   Blood pressure 139/82, pulse 76, temperature 98.2 F (36.8 C), temperature source Oral, resp. rate 16, height _0  (1.626 m), weight 78.4 kg, last menstrual period 11/06/2020, SpO2 100 %.  Physical  Exam Constitutional:      General: She is not in acute distress.    Appearance: Normal appearance. She is not ill-appearing, toxic-appearing or diaphoretic.  HENT:     Head: Normocephalic.  Skin:    General: Skin is warm.  Neurological:     Mental Status: She is alert and oriented to person, place, and time.   MAU Course  Procedures None  MDM  Urine without signs of dehydration.  Zofran 8 mg ODT given Patient feels much better, tolerating oral fluids. Feels like she has an appetite.   Assessment and Plan   A:  1. Nausea and vomiting in adult   2. [redacted] weeks gestation of pregnancy      P:  Discharge home in stable condition Rx: Zofran, and Phenergan Return to MAU if symptoms worsen  Bland diet Small, frequent meals.   Lezlie Lye, NP 01/20/2021 6:56 PM

## 2021-01-20 NOTE — Telephone Encounter (Signed)
Stevie left a voice message this am that she is a patient in our office. She states she is pregnant and has not been able to eat or keep anything down. States she feels week. States she needs to speak with a nurse. Per chart review is [redacted]w[redacted]d pregnant. I called Veleria and she confirms since Sunday she has not been able to eat. States she is drinking water but it comes back up. She confirms she feels weak, dizzy, lightheaded. She states she is taking the Reglan but it is not helping. I advised her to go to [facility] MAU for evaluation for dehydration from her N&V. I explained she may get IV fluids and meds for nausea and vomiting. I instructed her to ask for rx for different medication for N&V as well. She voices understanding. Abdulaziz Toman,RN

## 2021-01-27 ENCOUNTER — Ambulatory Visit: Payer: Medicaid Other | Admitting: Clinical

## 2021-01-27 DIAGNOSIS — F4322 Adjustment disorder with anxiety: Secondary | ICD-10-CM

## 2021-01-27 NOTE — Patient Instructions (Signed)
[facility] [address] [place], [place] [address] [phone] (main office) [phone] (Mayda Shippee's office)  [email or web address]   

## 2021-01-28 ENCOUNTER — Other Ambulatory Visit: Payer: Self-pay

## 2021-01-28 ENCOUNTER — Ambulatory Visit (INDEPENDENT_AMBULATORY_CARE_PROVIDER_SITE_OTHER): Payer: Medicaid Other | Admitting: Obstetrics and Gynecology

## 2021-01-28 ENCOUNTER — Other Ambulatory Visit (HOSPITAL_COMMUNITY)
Admission: RE | Admit: 2021-01-28 | Discharge: 2021-01-28 | Disposition: A | Discharge status: Home / Self Care | Payer: Medicaid Other | Source: Ambulatory Visit | Attending: Obstetrics and Gynecology | Admitting: Obstetrics and Gynecology

## 2021-01-28 DIAGNOSIS — O219 Vomiting of pregnancy, unspecified: Secondary | ICD-10-CM | POA: Diagnosis not present

## 2021-01-28 DIAGNOSIS — Z3490 Encounter for supervision of normal pregnancy, unspecified, unspecified trimester: Secondary | ICD-10-CM

## 2021-01-28 DIAGNOSIS — Z3A11 11 weeks gestation of pregnancy: Secondary | ICD-10-CM | POA: Insufficient documentation

## 2021-01-28 NOTE — Progress Notes (Signed)
INITIAL PRENATAL VISIT NOTE  Subjective:  Veronica Jones is a 30 y.o. G3P1011 at [redacted]w[redacted]d by LMP being seen today for her initial prenatal visit.  She has an obstetric history significant for SVD and postpartum preeclampsia. She has a medical history significant for nothing.  Patient reports nausea.  Contractions: Not present. Vag. Bleeding: None.  Movement: Present. Denies leaking of fluid.   Pt does tolerate liquids and most foods.  Pt does admit using THC for nausea control.  Ptr warned about cannabinoid hyperemesis syndrome.   Past Medical History:  Diagnosis Date   Chlamydia 2013   Hx of trichomoniasis    Postpartum eclampsia 04/19/2015    Past Surgical History:  Procedure Laterality Date   NO PAST SURGERIES      OB History  Gravida Para Term Preterm AB Living  $Remov'3 1 1   1 1  'NBLxxi$ SAB IAB Ectopic Multiple Live Births      1 0 1    # Outcome Date GA Lbr Len/2nd Weight Sex Delivery Anes PTL Lv  3 Current           2 Ectopic 02/2020          1 Term 04/16/15 [redacted]w[redacted]d / 00:15 5 lb 14 oz (2.665 kg) F Vag-Spont None  LIV    Social History   Socioeconomic History   Marital status: Single    Spouse name: Not on file   Number of children: 0   Years of education: Not on file   Highest education level: Not on file  Occupational History   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 She  reports a good relationship with her family members and feels it is a safe place. She plans to continue living there with her infant post-partum.    Social Determinants of Health   Financial Resource Strain: Not on file  Food Insecurity: No Food Insecurity   Worried About Charity fundraiser in the Last Year: Never true   Ran Out of Food in the Last Year: Never true  Transportation Needs: No Transportation Needs   Lack of Transportation (Medical): No   Lack of Transportation (Non-Medical): No  Physical Activity: Not on file  Stress: Not on file  Social Connections: Not on file    Family History  Problem Relation Age of Onset   Hyperlipidemia Mother    Hypertension Mother    Hyperlipidemia Father    Hypertension Father    Diabetes Maternal Aunt    Hypertension Paternal Grandmother    Cancer Paternal Grandmother        breast   Diabetes Paternal Grandmother      Current Outpatient Medications:    Blood Pressure  Monitoring (BLOOD PRESSURE KIT) DEVI, 1 Device by Does not apply route as needed., Disp: 1 each, Rfl: 0   metoCLOPramide (REGLAN) 10 MG tablet, Take 1 tablet (10 mg total) by mouth every 6 (six) hours as needed for nausea., Disp: 30 tablet, Rfl: 2   Misc. Devices (GOJJI WEIGHT SCALE) MISC, 1 Device by Does not apply route as needed., Disp: 1 each, Rfl: 0   ondansetron (ZOFRAN ODT) 8 MG disintegrating tablet, Take 1 tablet (8 mg total) by mouth every 8 (eight) hours as needed for nausea or vomiting., Disp: 20 tablet, Rfl: 1   Prenatal Vit-Fe Fumarate-FA (PREPLUS) 27-1 MG TABS, Take 1 tablet by mouth daily., Disp: , Rfl:    promethazine (PHENERGAN) 25 MG tablet, Take 1 tablet (25 mg total) by mouth every 6 (six) hours as needed for nausea or vomiting. This medication will make you sleepy., Disp: 30 tablet, Rfl: 1  Allergies  Allergen Reactions   Amoxicillin Hives   Penicillins Hives    Has patient had a PCN reaction causing immediate rash, facial/tongue/throat swelling,  SOB or lightheadedness with hypotension: No Has patient had a PCN reaction causing severe rash involving mucus membranes or skin necrosis: No Has patient had a PCN reaction that required hospitalization No Has patient had a PCN reaction occurring within the last 10 years: No If all of the above answers are "NO", then may proceed with Cephalosporin use.     Review of Systems: Negative except for what is mentioned in HPI.  Objective:   Vitals:   01/28/21 1004  BP: 118/80  Pulse: 96  Weight: 176 lb 11.2 oz (80.2 kg)    Fetal Status: Fetal Heart Rate (bpm): 159   Movement: Present     Physical Exam: BP 118/80   Pulse 96   Wt 176 lb 11.2 oz (80.2 kg)   LMP 11/06/2020 (Exact Date)   BMI 30.33 kg/m  CONSTITUTIONAL: Well-developed, well-nourished female in no acute distress.  NEUROLOGIC: Alert and oriented to person, place, and time. Normal reflexes, muscle tone coordination. No cranial nerve deficit noted. PSYCHIATRIC: Normal mood and affect. Normal behavior. Normal judgment and thought content. SKIN: Skin is warm and dry. No rash noted. Not diaphoretic. No erythema. No pallor. HENT:  Normocephalic, atraumatic, External right and left ear normal. Oropharynx is clear and moist EYES: Conjunctivae and EOM are normal.  NECK: Normal range of motion, supple, no masses CARDIOVASCULAR: Normal heart rate noted, regular rhythm RESPIRATORY: Effort and breath sounds normal, no problems with respiration noted BREASTS: deferred ABDOMEN: Soft, nontender, nondistended, gravid. GU: normal appearing external female genitalia, multiparous normal appearing cervix, scant white discharge in vagina, no lesions noted, pap taken Bimanual: 11 weeks sized uterus, no adnexal tenderness or palpable lesions noted MUSCULOSKELETAL: Normal range of motion. EXT:  No edema and no tenderness. 2+ distal pulses.   Assessment and Plan:  Pregnancy: G3P1011 at [redacted]w[redacted]d by LMP  1. Encounter for supervision of low-risk  pregnancy, antepartum Continue routine care, schedule anatomy scan at next visit - Cytology - PAP( [facility]) - Genetic Screening - Culture, OB Urine - Hemoglobin A1c - CBC/D/Plt+RPR+Rh+ABO+RubIgG... - GC/Chlamydia probe amp ([place])not at [facility]  2. [redacted] weeks gestation of pregnancy   3.  Nausea and vomiting:   Pt counseled about cannabinoid hyperemesis syndrome and the need to use the anti- emetics she currently has: reglan , zofran and phenergan.  Pt is tolerating liquids and most foods   Preterm labor symptoms and general obstetric precautions including but not  limited to vaginal bleeding, contractions, leaking of fluid and fetal movement were reviewed in detail with the patient.  Please refer to After Visit Summary for other counseling recommendations.   Return in about 4 weeks (around 02/25/2021) for ROB, in person.  Griffin Basil 01/28/2021 11:05 AM

## 2021-01-29 LAB — CBC/D/PLT+RPR+RH+ABO+RUBIGG...
Antibody Screen: NEGATIVE
Basophils Absolute: 0 10*3/uL (ref 0.0–0.2)
Basos: 0 %
EOS (ABSOLUTE): 0.1 10*3/uL (ref 0.0–0.4)
Eos: 2 %
HCV Ab: <0.1 s/co ratio (ref 0.0–0.9)
HIV Screen 4th Generation wRfx: NONREACTIVE
Hematocrit: 37.2 % (ref 34.0–46.6)
Hemoglobin: 12.9 g/dL (ref 11.1–15.9)
Hepatitis B Surface Ag: NEGATIVE
Immature Grans (Abs): 0 10*3/uL (ref 0.0–0.1)
Immature Granulocytes: 0 %
Lymphocytes Absolute: 1.7 10*3/uL (ref 0.7–3.1)
Lymphs: 33 %
MCH: 30.8 pg (ref 26.6–33.0)
MCHC: 34.7 g/dL (ref 31.5–35.7)
MCV: 89 fL (ref 79–97)
Monocytes Absolute: 0.5 10*3/uL (ref 0.1–0.9)
Monocytes: 10 %
Neutrophils Absolute: 2.9 10*3/uL (ref 1.4–7.0)
Neutrophils: 55 %
Platelets: 119 10*3/uL — ABNORMAL LOW (ref 150–450)
RBC: 4.19 x10E6/uL (ref 3.77–5.28)
RDW: 13.2 % (ref 11.7–15.4)
RPR Ser Ql: NONREACTIVE
Rh Factor: POSITIVE
Rubella Antibodies, IGG: <0.9 index — ABNORMAL LOW (ref >0.99)
WBC: 5.3 10*3/uL (ref 3.4–10.8)

## 2021-01-29 LAB — GC/CHLAMYDIA PROBE AMP (~~LOC~~) NOT AT ARMC
Chlamydia: NEGATIVE
Comment: NEGATIVE
Comment: NORMAL
Neisseria Gonorrhea: NEGATIVE

## 2021-01-29 LAB — HCV INTERPRETATION

## 2021-01-29 LAB — HEMOGLOBIN A1C
Est. average glucose Bld gHb Est-mCnc: 105 mg/dL
Hgb A1c MFr Bld: 5.3 % (ref 4.8–5.6)

## 2021-01-30 LAB — CULTURE, OB URINE

## 2021-01-30 LAB — URINE CULTURE, OB REFLEX

## 2021-02-01 ENCOUNTER — Encounter: Payer: Self-pay | Admitting: *Deleted

## 2021-02-02 LAB — CYTOLOGY - PAP

## 2021-02-17 ENCOUNTER — Telehealth: Payer: Self-pay

## 2021-02-17 ENCOUNTER — Telehealth: Payer: Medicaid Other | Admitting: Physician Assistant

## 2021-02-17 ENCOUNTER — Encounter: Payer: Self-pay | Admitting: Physician Assistant

## 2021-02-17 DIAGNOSIS — U071 COVID-19: Secondary | ICD-10-CM

## 2021-02-17 DIAGNOSIS — Z20822 Contact with and (suspected) exposure to covid-19: Secondary | ICD-10-CM | POA: Diagnosis not present

## 2021-02-17 DIAGNOSIS — Z3492 Encounter for supervision of normal pregnancy, unspecified, second trimester: Secondary | ICD-10-CM

## 2021-02-17 NOTE — Telephone Encounter (Signed)
Patient called front office and asked to speak with clinical staff. Reports positive COVID test result today. Symptoms began yesterday; day 10 of quarantine will be 02/26/21. Pt's appt to be moved until after quarantine period. Pt reports sore throat, body aches, nausea, and vomiting. List of medications to treat symptoms sent via MyChart. Pt states she does not have Phenergan at home, was not able to pick up. Pt's partner will go to pharmacy and pick up Phenergan and otc medication to treat symptoms.

## 2021-02-17 NOTE — Progress Notes (Signed)
Virtual Visit Consent   Veronica Jones, you are scheduled for a virtual visit with a [place] provider today.     Just as with appointments in the office, your consent must be obtained to participate.  Your consent will be active for this visit and any virtual visit you may have with one of our providers in the next 365 days.     If you have a MyChart account, a copy of this consent can be sent to you electronically.  All virtual visits are billed to your insurance company just like a traditional visit in the office.    As this is a virtual visit, video technology does not allow for your provider to perform a traditional examination.  This may limit your provider's ability to fully assess your condition.  If your provider identifies any concerns that need to be evaluated in person or the need to arrange testing (such as labs, EKG, etc.), we will make arrangements to do so.     Although advances in technology are sophisticated, we cannot ensure that it will always work on either your end or our end.  If the connection with a video visit is poor, the visit may have to be switched to a telephone visit.  With either a video or telephone visit, we are not always able to ensure that we have a secure connection.     I need to obtain your verbal consent now.   Are you willing to proceed with your visit today?    Janace Decker has provided verbal consent on 02/17/2021 for a virtual visit (video or telephone).   Leeanne Rio, [place]   Date: 02/17/2021 8:08 AM   Virtual Visit via Video Note   I, Leeanne Rio, connected with  Veronica Jones  ([id number], 10-Aug-1990) on 02/17/21 at  7:45 AM EDT by a video-enabled telemedicine application and verified that I am speaking with the correct person using two identifiers.  Location: Patient: Virtual Visit Location Patient: Home Provider: Virtual Visit Location Provider: Home Office   I discussed the limitations of evaluation and management by  telemedicine and the availability of in person appointments. The patient expressed understanding and agreed to proceed.    History of Present Illness: Veronica Jones is a 30 y.o. who identifies as a female who was assigned female at birth, and is being seen today for abrupt onset of symptoms last night. Endorses late last night noting fatigue, aches, chills and a couple of episodes of non-bloody emesis last night. Denies any known fever but felt very flushed all night long. This morning with ST, headache and significant fatigue. Denies chest pain or shortness of breath. Has had her initial COVID vaccines without booster. She is currently ~ [redacted] weeks pregnant.   HPI: HPI  Problems:  Patient Active Problem List   Diagnosis Date Noted   [redacted] weeks gestation of pregnancy 01/28/2021   Nausea and vomiting during pregnancy 01/28/2021   Supervision of low-risk pregnancy 12/28/2020   Trichomonal vaginitis during pregnancy in first trimester 12/06/2020   Ectopic pregnancy, tubal 03/12/2020    Allergies:  Allergies  Allergen Reactions   Amoxicillin Hives   Penicillins Hives    Has patient had a PCN reaction causing immediate rash, facial/tongue/throat swelling, SOB or lightheadedness with hypotension: No Has patient had a PCN reaction causing severe rash involving mucus membranes or skin necrosis: No Has patient had a PCN reaction that required hospitalization No Has patient had a PCN reaction occurring within the  last 10 years: No If all of the above answers are "NO", then may proceed with Cephalosporin use.    Medications:  Current Outpatient Medications:    Blood Pressure Monitoring (BLOOD PRESSURE KIT) DEVI, 1 Device by Does not apply route as needed., Disp: 1 each, Rfl: 0   metoCLOPramide (REGLAN) 10 MG tablet, Take 1 tablet (10 mg total) by mouth every 6 (six) hours as needed for nausea., Disp: 30 tablet, Rfl: 2   Misc. Devices (GOJJI WEIGHT SCALE) MISC, 1 Device by Does not apply route as  needed., Disp: 1 each, Rfl: 0   Prenatal Vit-Fe Fumarate-FA (PREPLUS) 27-1 MG TABS, Take 1 tablet by mouth daily., Disp: , Rfl:    promethazine (PHENERGAN) 25 MG tablet, Take 1 tablet (25 mg total) by mouth every 6 (six) hours as needed for nausea or vomiting. This medication will make you sleepy., Disp: 30 tablet, Rfl: 1  Observations/Objective: Patient is well-developed, well-nourished in no acute distress.  Resting comfortably in bed at home.  Head is normocephalic, atraumatic.  No labored breathing. Speech is clear and coherent with logical content.  Patient is alert and oriented at baseline.   Assessment and Plan: 1. Suspected COVID-19 virus infection  2. Pregnant and not yet delivered in second trimester  Abrupt onset of symptoms and her constellation of symptoms are concerning for COVID-19. She is higher risk of complications giving medical history and her pregnancy status. She is to get tested ASAP as directed. Resources given. Continue regular medication per her OB. Can use tylenol for fever and headache. Other pregnancy-safe OTC medications reviewed with patient. She is to let us know ASAP of test results so we can determine next steps. Also want her to make her OB aware of test results. She has been enrolled in a COVID symptom monitoring program through [facility]. She is to isolate until results are in. Strict ER precautions reviewed with patient.   Follow Up Instructions: I discussed the assessment and treatment plan with the patient. The patient was provided an opportunity to ask questions and all were answered. The patient agreed with the plan and demonstrated an understanding of the instructions.  A copy of instructions were sent to the patient via MyChart.  The patient was advised to call back or seek an in-person evaluation if the symptoms worsen or if the condition fails to improve as anticipated.  Time:  I spent 15 minutes with the patient via telehealth technology  discussing the above problems/concerns.    Leeanne Rio, PA-C

## 2021-02-17 NOTE — Patient Instructions (Signed)
  Beverly Gust, thank you for joining Leeanne Rio, PA-C for today's virtual visit.  While this provider is not your primary care provider (PCP), if your PCP is located in our provider database this encounter information will be shared with them immediately following your visit.  Consent: (Patient) Veronica Jones provided verbal consent for this virtual visit at the beginning of the encounter.  Current Medications:  Current Outpatient Medications:    Blood Pressure Monitoring (BLOOD PRESSURE KIT) DEVI, 1 Device by Does not apply route as needed., Disp: 1 each, Rfl: 0   metoCLOPramide (REGLAN) 10 MG tablet, Take 1 tablet (10 mg total) by mouth every 6 (six) hours as needed for nausea., Disp: 30 tablet, Rfl: 2   Misc. Devices (GOJJI WEIGHT SCALE) MISC, 1 Device by Does not apply route as needed., Disp: 1 each, Rfl: 0   ondansetron (ZOFRAN ODT) 8 MG disintegrating tablet, Take 1 tablet (8 mg total) by mouth every 8 (eight) hours as needed for nausea or vomiting., Disp: 20 tablet, Rfl: 1   Prenatal Vit-Fe Fumarate-FA (PREPLUS) 27-1 MG TABS, Take 1 tablet by mouth daily., Disp: , Rfl:    promethazine (PHENERGAN) 25 MG tablet, Take 1 tablet (25 mg total) by mouth every 6 (six) hours as needed for nausea or vomiting. This medication will make you sleepy., Disp: 30 tablet, Rfl: 1   Medications ordered in this encounter:  No orders of the defined types were placed in this encounter.    *If you need refills on other medications prior to your next appointment, please contact your pharmacy*  Follow-Up: Call back or seek an in-person evaluation if the symptoms worsen or if the condition fails to improve as anticipated.  Other Instructions Please let me know ASAP of your COVID test results. I also want you to make your OB aware if you get a positive test result.  Please keep well-hydrated and get plenty of rest. Start a saline nasal rinse to flush out your nasal passages. You can use Tylenol for  headache and sore throat. You can also use OTC Claritin or Sudafed to help with your nasal/head congestion. Do not use other OTC medications without discussing with pharmacist or your OB.  If you have a humidifier, running in the bedroom at night. Please take prescribed medications as directed.  You have been enrolled in a MyChart symptom monitoring program. Please answer these questions daily so we can keep track of how you are doing.   If you note any worsening of symptoms, any significant shortness of breath or any chest pain, please seek ER evaluation ASAP.  Please do not delay care!    If you have been instructed to have an in-person evaluation today at a local Urgent Care facility, please use the link below. It will take you to a list of all of our available [facility], including address, phone number and hours of operation. Please do not delay care.  [facility]  If you or a family member do not have a primary care provider, use the link below to schedule a visit and establish care. When you choose a [facility] primary care physician or advanced practice provider, you gain a long-term partner in health. Find a Primary Care Provider  Learn more about [facility]'s in-office and virtual care options: [facility]

## 2021-02-18 ENCOUNTER — Telehealth: Payer: Self-pay

## 2021-02-18 NOTE — Telephone Encounter (Signed)
BPA triggered for worsening symptoms cough. She says she didn't have a cough yesterday, but today she does and it hurts her throat. Patient has received advice in her MyChart from [facility] nurse. I advised to follow those recommendations for OTC medications. She verbalized understanding.

## 2021-02-25 ENCOUNTER — Other Ambulatory Visit: Payer: Self-pay

## 2021-02-25 ENCOUNTER — Ambulatory Visit (INDEPENDENT_AMBULATORY_CARE_PROVIDER_SITE_OTHER): Payer: Medicaid Other | Admitting: Nurse Practitioner

## 2021-02-25 ENCOUNTER — Other Ambulatory Visit (HOSPITAL_COMMUNITY)
Admission: RE | Admit: 2021-02-25 | Discharge: 2021-02-25 | Disposition: A | Discharge status: Home / Self Care | Payer: Medicaid Other | Source: Ambulatory Visit | Attending: Nurse Practitioner | Admitting: Nurse Practitioner

## 2021-02-25 VITALS — BP 136/64 | HR 83 | Wt 179.3 lb

## 2021-02-25 DIAGNOSIS — Z3490 Encounter for supervision of normal pregnancy, unspecified, unspecified trimester: Secondary | ICD-10-CM | POA: Diagnosis not present

## 2021-02-25 DIAGNOSIS — Z3A15 15 weeks gestation of pregnancy: Secondary | ICD-10-CM

## 2021-02-25 DIAGNOSIS — Z23 Encounter for immunization: Secondary | ICD-10-CM | POA: Diagnosis not present

## 2021-02-25 DIAGNOSIS — R3915 Urgency of urination: Secondary | ICD-10-CM

## 2021-02-25 DIAGNOSIS — O219 Vomiting of pregnancy, unspecified: Secondary | ICD-10-CM

## 2021-02-25 LAB — POCT URINALYSIS DIP (DEVICE)
Bilirubin Urine: NEGATIVE
Glucose, UA: NEGATIVE mg/dL
Ketones, ur: NEGATIVE mg/dL
Nitrite: NEGATIVE
Protein, ur: 100 mg/dL — AB
Specific Gravity, Urine: >=1.03 (ref 1.005–1.030)
Urobilinogen, UA: 0.2 mg/dL (ref 0.0–1.0)
pH: 5.5 (ref 5.0–8.0)

## 2021-02-25 NOTE — Progress Notes (Signed)
    Subjective:  Veronica Jones is a 30 y.o. G3P1011 at [redacted]w[redacted]d being seen today for ongoing prenatal care.  She is currently monitored for the following issues for this low-risk pregnancy and has Ectopic pregnancy, tubal; Trichomonal vaginitis during pregnancy in first trimester; Supervision of low-risk pregnancy; [redacted] weeks gestation of pregnancy; and Nausea and vomiting during pregnancy on their problem list.  Patient reports  urinary urgency after urinating - thinking she has a UTI .  Contractions: Not present. Vag. Bleeding: Small.  Movement: Present. Denies leaking of fluid.   The following portions of the patient's history were reviewed and updated as appropriate: allergies, current medications, past family history, past medical history, past social history, past surgical history and problem list. Problem list updated.  Objective:   Vitals:   02/25/21 0827  BP: 136/64  Pulse: 83  Weight: 179 lb 4.8 oz (81.3 kg)    Fetal Status: Fetal Heart Rate (bpm): 145 Fundal Height: 16 cm Movement: Present     General:  Alert, oriented and cooperative. Patient is in no acute distress.  Skin: Skin is warm and dry. No rash noted.   Cardiovascular: Normal heart rate noted  Respiratory: Normal respiratory effort, no problems with respiration noted  Abdomen: Soft, gravid, appropriate for gestational age. Pain/Pressure: Present     Pelvic:  Cervical exam deferred      Vaginal self swab done  Extremities: Normal range of motion.  Edema: None  Mental Status: Normal mood and affect. Normal behavior. Normal judgment and thought content.   Urinalysis:      Assessment and Plan:  Pregnancy: G3P1011 at [redacted]w[redacted]d  1. Encounter for supervision of low-risk pregnancy, antepartum AFP done, Flu shot given Will recheck CBC to check platelets as requested at previous visit - AFP, Serum, Open Spina Bifida - Flu Vaccine QUAD 83mo+IM (Fluarix, Fluzone & Alfiuria Quad PF) - Cervicovaginal ancillary only( [facility]) -  CBC - Culture, OB Urine  2. Nausea and vomiting during pregnancy Now resolved  3. Urgency of urination Wondering if she has a UTI Will do Urine culture to check  - POCT urinalysis dip (device)  4. [redacted] weeks gestation of pregnancy   Preterm labor symptoms and general obstetric precautions including but not limited to vaginal bleeding, contractions, leaking of fluid and fetal movement were reviewed in detail with the patient. Please refer to After Visit Summary for other counseling recommendations.  Return in about 4 weeks (around 03/25/2021) for in person ROB.  Nolene Bernheim, RN, MSN, NP-BC Nurse Practitioner, [facility] for [employer], [facility] 02/25/2021 6:15 PM

## 2021-02-25 NOTE — Progress Notes (Signed)
Spotting when wiping

## 2021-02-26 LAB — CBC
Hematocrit: 33.9 % — ABNORMAL LOW (ref 34.0–46.6)
Hemoglobin: 11.9 g/dL (ref 11.1–15.9)
MCH: 31.3 pg (ref 26.6–33.0)
MCHC: 35.1 g/dL (ref 31.5–35.7)
MCV: 89 fL (ref 79–97)
Platelets: 102 10*3/uL — ABNORMAL LOW (ref 150–450)
RBC: 3.8 x10E6/uL (ref 3.77–5.28)
RDW: 12.9 % (ref 11.7–15.4)
WBC: 8.3 10*3/uL (ref 3.4–10.8)

## 2021-02-26 LAB — CERVICOVAGINAL ANCILLARY ONLY
Bacterial Vaginitis (gardnerella): NEGATIVE
Candida Glabrata: NEGATIVE
Candida Vaginitis: POSITIVE — AB
Chlamydia: NEGATIVE
Comment: NEGATIVE
Comment: NEGATIVE
Comment: NEGATIVE
Comment: NEGATIVE
Comment: NEGATIVE
Comment: NORMAL
Neisseria Gonorrhea: NEGATIVE
Trichomonas: NEGATIVE

## 2021-02-26 MED ORDER — TERCONAZOLE 0.4 % VA CREA: 1.0000 | TOPICAL_CREAM | Freq: Every day | VAGINAL | 1 refills | Status: AC

## 2021-02-26 NOTE — Addendum Note (Signed)
Addended by: Currie Paris on: 02/26/2021 02:43 PM   Modules accepted: Orders

## 2021-02-27 ENCOUNTER — Encounter: Payer: Self-pay | Admitting: Nurse Practitioner

## 2021-02-27 DIAGNOSIS — R87612 Low grade squamous intraepithelial lesion on cytologic smear of cervix (LGSIL): Secondary | ICD-10-CM

## 2021-02-27 HISTORY — DX: Low grade squamous intraepithelial lesion on cytologic smear of cervix (LGSIL): R87.612

## 2021-02-27 LAB — AFP, SERUM, OPEN SPINA BIFIDA
AFP MoM: 1.12
AFP Value: 36.4 ng/mL
Gest. Age on Collection Date: 15.6 weeks
Maternal Age At EDD: 28.4 yr
OSBR Risk 1 IN: 10000
Test Results:: NEGATIVE
Weight: 179 [lb_av]

## 2021-03-01 ENCOUNTER — Encounter: Payer: Self-pay | Admitting: Nurse Practitioner

## 2021-03-01 ENCOUNTER — Other Ambulatory Visit: Payer: Self-pay | Admitting: Obstetrics and Gynecology

## 2021-03-01 ENCOUNTER — Telehealth: Payer: Self-pay | Admitting: Hematology

## 2021-03-01 ENCOUNTER — Telehealth: Payer: Self-pay | Admitting: Lactation Services

## 2021-03-01 DIAGNOSIS — D696 Thrombocytopenia, unspecified: Secondary | ICD-10-CM

## 2021-03-01 DIAGNOSIS — Z3490 Encounter for supervision of normal pregnancy, unspecified, unspecified trimester: Secondary | ICD-10-CM | POA: Insufficient documentation

## 2021-03-01 DIAGNOSIS — O99119 Other diseases of the blood and blood-forming organs and certain disorders involving the immune mechanism complicating pregnancy, unspecified trimester: Secondary | ICD-10-CM

## 2021-03-01 DIAGNOSIS — Z2839 Other underimmunization status: Secondary | ICD-10-CM | POA: Insufficient documentation

## 2021-03-01 HISTORY — DX: Encounter for supervision of normal pregnancy, unspecified, unspecified trimester: Z34.90

## 2021-03-01 NOTE — Progress Notes (Signed)
Platelets decreased from 155 to 102 at 16 weeks, referral placed to hematology

## 2021-03-01 NOTE — Telephone Encounter (Signed)
Called [facility] at [facility] to schedule Hematology appointment for Thrombocytopenia. Spoke with Toma Copier and appointment scheduled for 9/21 at 11 am.   Called patient to inform her of upcoming appointment. Patient was given date, time and location of appointment.   Patient concerned she got a call from the [facility] and is very concerned. Reviewed she is being referred due to low platelets and per chart reviewed, this is not a new diagnosis for her. Reviewed the Hematologist will be able to answer all her questions about this.   Patient with no other questions or concerns at this time.

## 2021-03-01 NOTE — Telephone Encounter (Signed)
Received a call from Dr. Laverna Peace office in regards to 9/19 referral put in for pt. They requested to schedule pt's appt and they would inform pt. I scheduled pt for next available, they said they would tell the pt.

## 2021-03-01 NOTE — Telephone Encounter (Signed)
-----   Message from Warden Fillers, MD sent at 03/01/2021  6:42 AM EDT ----- Regarding: hematology Please make sure pt gets in with hematology due to thrombocytopenia, referral placed in system

## 2021-03-03 ENCOUNTER — Encounter: Payer: Self-pay | Admitting: Hematology

## 2021-03-03 ENCOUNTER — Other Ambulatory Visit: Payer: Self-pay

## 2021-03-03 ENCOUNTER — Inpatient Hospital Stay: Payer: Medicaid Other

## 2021-03-03 ENCOUNTER — Inpatient Hospital Stay: Payer: Medicaid Other | Attending: Hematology | Admitting: Hematology

## 2021-03-03 VITALS — BP 127/69 | HR 84 | Temp 98.5°F | Resp 18 | Wt 182.0 lb

## 2021-03-03 DIAGNOSIS — D6959 Other secondary thrombocytopenia: Secondary | ICD-10-CM | POA: Diagnosis not present

## 2021-03-03 DIAGNOSIS — O99112 Other diseases of the blood and blood-forming organs and certain disorders involving the immune mechanism complicating pregnancy, second trimester: Secondary | ICD-10-CM | POA: Insufficient documentation

## 2021-03-03 DIAGNOSIS — D696 Thrombocytopenia, unspecified: Secondary | ICD-10-CM

## 2021-03-03 DIAGNOSIS — K0889 Other specified disorders of teeth and supporting structures: Secondary | ICD-10-CM

## 2021-03-03 LAB — CMP (CANCER CENTER ONLY)
ALT: 32 U/L (ref 0–44)
AST: 30 U/L (ref 15–41)
Albumin: 3.1 g/dL — ABNORMAL LOW (ref 3.5–5.0)
Alkaline Phosphatase: 44 U/L (ref 38–126)
Anion gap: 9 (ref 5–15)
BUN: 7 mg/dL (ref 6–20)
CO2: 21 mmol/L — ABNORMAL LOW (ref 22–32)
Calcium: 8.7 mg/dL — ABNORMAL LOW (ref 8.9–10.3)
Chloride: 107 mmol/L (ref 98–111)
Creatinine: 0.61 mg/dL (ref 0.44–1.00)
GFR, Estimated: >60 mL/min (ref >60)
Glucose, Bld: 85 mg/dL (ref 70–99)
Potassium: 3.4 mmol/L — ABNORMAL LOW (ref 3.5–5.1)
Sodium: 137 mmol/L (ref 135–145)
Total Bilirubin: 0.3 mg/dL (ref 0.3–1.2)
Total Protein: 6.5 g/dL (ref 6.5–8.1)

## 2021-03-03 LAB — CBC WITH DIFFERENTIAL/PLATELET
Abs Immature Granulocytes: 0.05 10*3/uL (ref 0.00–0.07)
Basophils Absolute: 0 10*3/uL (ref 0.0–0.1)
Basophils Relative: 0 %
Eosinophils Absolute: 0.1 10*3/uL (ref 0.0–0.5)
Eosinophils Relative: 2 %
HCT: 33.1 % — ABNORMAL LOW (ref 36.0–46.0)
Hemoglobin: 11.4 g/dL — ABNORMAL LOW (ref 12.0–15.0)
Immature Granulocytes: 1 %
Lymphocytes Relative: 25 %
Lymphs Abs: 2.2 10*3/uL (ref 0.7–4.0)
MCH: 30.7 pg (ref 26.0–34.0)
MCHC: 34.4 g/dL (ref 30.0–36.0)
MCV: 89.2 fL (ref 80.0–100.0)
Monocytes Absolute: 0.9 10*3/uL (ref 0.1–1.0)
Monocytes Relative: 10 %
Neutro Abs: 5.5 10*3/uL (ref 1.7–7.7)
Neutrophils Relative %: 62 %
Platelets: 132 10*3/uL — ABNORMAL LOW (ref 150–400)
RBC: 3.71 MIL/uL — ABNORMAL LOW (ref 3.87–5.11)
RDW: 13.5 % (ref 11.5–15.5)
WBC: 8.7 10*3/uL (ref 4.0–10.5)
nRBC: 0 % (ref 0.0–0.2)

## 2021-03-03 LAB — IRON AND TIBC
Iron: 71 ug/dL (ref 41–142)
Saturation Ratios: 18 % — ABNORMAL LOW (ref 21–57)
TIBC: 395 ug/dL (ref 236–444)
UIBC: 325 ug/dL (ref 120–384)

## 2021-03-03 LAB — FERRITIN: Ferritin: 24 ng/mL (ref 11–307)

## 2021-03-03 LAB — VITAMIN B12: Vitamin B-12: 190 pg/mL (ref 180–914)

## 2021-03-03 LAB — HEPATITIS C ANTIBODY: HCV Ab: NONREACTIVE

## 2021-03-03 LAB — IMMATURE PLATELET FRACTION: Immature Platelet Fraction: 17.5 % — ABNORMAL HIGH (ref 1.2–8.6)

## 2021-03-03 LAB — HIV ANTIBODY (ROUTINE TESTING W REFLEX): HIV Screen 4th Generation wRfx: NONREACTIVE

## 2021-03-03 NOTE — Progress Notes (Signed)
[facility]   HEMATOLOGY/ONCOLOGY CONSULTATION NOTE  Date of Service: 03/03/2021  Patient Care Team: Patient, No Pcp Per (Inactive) as PCP - General (General Practice)  CHIEF COMPLAINTS/PURPOSE OF CONSULTATION:  Thrombocytopenia in pregnancy  HISTORY OF PRESENTING ILLNESS:   Veronica Jones is a wonderful 30 y.o. female who has been referred to [place] by Dr .Lynnda Shields MD for evaluation and management of thrombocytopenia in pregnancy.  Patient is currently about [redacted] weeks pregnant.  She is G3 P1 A1  Notes having some mild thrombocytopenia with her previous pregnancy but this was not an issue. Denies any issues with bleeding or abnormal bruising. No recent infections fevers chills night sweats.  Denies any over-the-counter medications other than prenatal vitamins. No family history of blood disorders.  Labs done early in pregnancy on 01/28/2021 with her OB/GYN doctor Showed CBC with hemoglobin of 12.9 MCV of 89 WBC count of 5.3k platelets of 119k with some aggregation of platelets noted in the sample.  Patient denies any previous history of ITP diagnosis or treatment.  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 She reports a good relationship with her family members and feels it is a safe place. She plans to continue living there with her infant post-partum.    Social Determinants of Health   Financial Resource Strain: Not on file  Food Insecurity: No Food Insecurity   Worried About Charity fundraiser in the Last Year: Never true   Ran Out of Food in the Last Year: Never true  Transportation Needs: No Transportation Needs   Lack of Transportation (Medical): No   Lack of Transportation (Non-Medical): No  Physical Activity: Not on file  Stress: Not on file  Social Connections: Not on file  Intimate Partner Violence: Not on file    FAMILY HISTORY: Family History  Problem Relation Age of Onset   Hyperlipidemia Mother    Hypertension Mother    Hyperlipidemia Father    Hypertension Father    Diabetes Maternal Aunt    Hypertension Paternal Grandmother    Cancer Paternal Grandmother        breast   Diabetes Paternal Grandmother  ALLERGIES:  is allergic to amoxicillin and penicillins.  MEDICATIONS:  Current Outpatient Medications  Medication Sig Dispense Refill   Blood Pressure Monitoring (BLOOD PRESSURE KIT) DEVI 1 Device by Does not apply route as needed. 1 each 0   Misc. Devices (GOJJI WEIGHT SCALE) MISC 1 Device by Does not apply route as needed. 1 each 0   Prenatal Vit-Fe Fumarate-FA (PREPLUS) 27-1 MG TABS Take 1 tablet by mouth daily.     promethazine (PHENERGAN) 25 MG tablet Take 1 tablet (25 mg total) by mouth every 6 (six) hours as needed for nausea or vomiting. This medication will make you sleepy. 30 tablet 1   terconazole (TERAZOL 7) 0.4 % vaginal cream Place 1 applicator vaginally at bedtime  for 7 days. 45 g 1   No current facility-administered medications for this visit.    REVIEW OF SYSTEMS:    10 Point review of Systems was done is negative except as noted above.  PHYSICAL EXAMINATION: ECOG PERFORMANCE STATUS: 1 - Symptomatic but completely ambulatory  . Vitals:   03/03/21 1114  BP: 127/69  Pulse: 84  Resp: 18  Temp: 98.5 F (36.9 C)  SpO2: 100%   Filed Weights   03/03/21 1114  Weight: 182 lb (82.6 kg)   .Body mass index is 31.24 kg/m.  GENERAL:alert, in no acute distress and comfortable SKIN: no acute rashes, no significant lesions EYES: conjunctiva are pink and non-injected, sclera anicteric OROPHARYNX: MMM, no exudates, no oropharyngeal erythema or ulceration NECK: supple, no JVD LYMPH:  no palpable lymphadenopathy in the cervical, axillary or inguinal regions LUNGS: clear to auscultation b/l with normal respiratory effort HEART: regular rate & rhythm ABDOMEN:  normoactive bowel sounds , non tender, not distended. Extremity: no pedal edema PSYCH: alert & oriented x 3 with fluent speech NEURO: no focal motor/sensory deficits  LABORATORY DATA:  I have reviewed the data as listed  . CBC Latest Ref Rng & Units 02/25/2021 01/28/2021 12/06/2020  WBC 3.4 - 10.8 x10E3/uL 8.3 5.3 5.9  Hemoglobin 11.1 - 15.9 g/dL 11.9 12.9 12.2  Hematocrit 34.0 - 46.6 % 33.9(L) 37.2 36.0  Platelets 150 - 450 x10E3/uL 102(L) 119(L) 155    . CMP Latest Ref Rng & Units 03/12/2020 02/27/2020 05/18/2017  Glucose 70 - 99 mg/dL 92 106(H) 111(H)  BUN 6 - 20 mg/dL $Remove'10 8 10  'OChqHgN$ Creatinine 0.44 - 1.00 mg/dL 0.79 0.83 0.79  Sodium 135 - 145 mmol/L 137 139 142  Potassium 3.5 - 5.1 mmol/L 4.4 4.2 3.9  Chloride 98 - 111 mmol/L 106 107 113(H)  CO2 22 - 32 mmol/L $RemoveB'22 22 23  'XLxsVHVR$ Calcium 8.9 - 10.3 mg/dL 9.4 9.0 8.6(L)  Total Protein 6.5 - 8.1 g/dL 7.1 7.1 -  Total Bilirubin 0.3 - 1.2 mg/dL 0.8 1.5(H) -  Alkaline Phos 38 - 126 U/L 34(L) 32(L) -  AST 15 - 41 U/L 16 18 -  ALT 0 - 44 U/L 15 14  -     RADIOGRAPHIC STUDIES: I have personally reviewed the radiological images as listed and agreed with the findings in the report. No results found.  ASSESSMENT & PLAN:   30 year old female with  #1 mild thrombocytopenia in second trimester pregnancy Plan -Reviewed previous and CBC/diff done on 01/28/2021 with her OB/GYN doctor which showed mild thrombocytopenia with platelets of 119k and evidence of platelet aggregation on the sample suggesting some element of pseudothrombocytopenia. -Unclear at this time if the patient just has gestational thrombocytopenia or if there is any other fracture involved. -  Recommended minimizing medications that can cause thrombocytopenia. -Lab work-up ordered including looking for vitamin deficiencies.  As noted above -Patient notes issues with recurrent dental abscess does not have a dentist and was given a referral to [facility] dentist.  Follow-up Labs today RTC with Dr Irene Limbo with labs in 2 months Referral to [facility] dental to evaluate for possible dental abscess (patient pregnant) . Orders Placed This Encounter  Procedures   CBC with Differential/Platelet    Standing Status:   Future    Number of Occurrences:   1    Standing Expiration Date:   03/03/2022   CMP ([place] only)    Standing Status:   Future    Number of Occurrences:   1    Standing Expiration Date:   03/03/2022   Vitamin B12    Standing Status:   Future    Number of Occurrences:   1    Standing Expiration Date:   03/03/2022   Folate RBC    Standing Status:   Future    Number of Occurrences:   1    Standing Expiration Date:   03/03/2022   Ferritin    Standing Status:   Future    Number of Occurrences:   1    Standing Expiration Date:   03/03/2022   Iron and TIBC    Standing Status:   Future    Number of Occurrences:   1    Standing Expiration Date:   03/03/2022   Immature Platelet Fraction    Standing Status:   Future    Number of Occurrences:   1    Standing Expiration  Date:   03/03/2022   Hepatitis C antibody    Standing Status:   Future    Number of Occurrences:   1    Standing Expiration Date:   03/03/2022   HIV Antibody (routine testing w rflx)    Standing Status:   Future    Number of Occurrences:   1    Standing Expiration Date:   03/03/2022   Ambulatory referral to Dentistry    Referral Priority:   Routine    Referral Type:   Consultation    Referral Reason:   Specialty Services Required    Requested Specialty:   Dental General Practice    Number of Visits Requested:   1     All of the patients questions were answered with apparent satisfaction. The patient knows to call the clinic with any problems, questions or concerns.  I spent 40 minutes counseling the patient face to face. The total time spent in the appointment was 50 minutes and more than 50% was on counseling and direct patient cares.    Sullivan Lone MD [place] AAHIVMS [facility] Hematology/Oncology Physician [facility]  (Office):       [phone] (Work cell):  [phone] (Fax):           [phone]  03/03/2021 11:16 AM

## 2021-03-04 ENCOUNTER — Telehealth: Payer: Self-pay | Admitting: Hematology

## 2021-03-04 LAB — FOLATE RBC
Folate, Hemolysate: 342 ng/mL
Folate, RBC: 1015 ng/mL (ref >498)
Hematocrit: 33.7 % — ABNORMAL LOW (ref 34.0–46.6)

## 2021-03-04 NOTE — Telephone Encounter (Signed)
Scheduled follow-up appointment per 9/21 los. Patient is aware. 

## 2021-03-10 ENCOUNTER — Telehealth: Payer: Self-pay

## 2021-03-10 NOTE — Telephone Encounter (Signed)
Contacted pt per Dr Candise Che:  Please let patient know on repeat labs her platelets are actually up from 119k to 132k.  She has an element of pseudothrombocytopenia where platelets were being underestimated due to clumping.  She also has significant B12 deficiency and would recommend she start taking B12 1000 mcg daily in addition to her prenatal vitamins.  Would also recommend she start taking ferrous sulfate 325 mg 1 tablet p.o. daily for iron deficiency.    Pt acknowledged above and verbalized understanding.

## 2021-03-15 ENCOUNTER — Other Ambulatory Visit: Payer: Self-pay | Admitting: Obstetrics and Gynecology

## 2021-03-15 ENCOUNTER — Other Ambulatory Visit: Payer: Self-pay

## 2021-03-15 ENCOUNTER — Ambulatory Visit: Payer: Medicaid Other | Attending: Obstetrics and Gynecology

## 2021-03-15 DIAGNOSIS — O99012 Anemia complicating pregnancy, second trimester: Secondary | ICD-10-CM

## 2021-03-15 DIAGNOSIS — O99322 Drug use complicating pregnancy, second trimester: Secondary | ICD-10-CM | POA: Diagnosis not present

## 2021-03-15 DIAGNOSIS — Z3490 Encounter for supervision of normal pregnancy, unspecified, unspecified trimester: Secondary | ICD-10-CM

## 2021-03-15 DIAGNOSIS — F1920 Other psychoactive substance dependence, uncomplicated: Secondary | ICD-10-CM

## 2021-03-15 DIAGNOSIS — D691 Qualitative platelet defects: Secondary | ICD-10-CM | POA: Insufficient documentation

## 2021-03-15 DIAGNOSIS — O99112 Other diseases of the blood and blood-forming organs and certain disorders involving the immune mechanism complicating pregnancy, second trimester: Secondary | ICD-10-CM | POA: Diagnosis not present

## 2021-03-16 ENCOUNTER — Other Ambulatory Visit: Payer: Self-pay | Admitting: *Deleted

## 2021-03-16 DIAGNOSIS — R898 Other abnormal findings in specimens from other organs, systems and tissues: Secondary | ICD-10-CM

## 2021-03-16 DIAGNOSIS — Z362 Encounter for other antenatal screening follow-up: Secondary | ICD-10-CM

## 2021-03-23 ENCOUNTER — Encounter: Payer: Self-pay | Admitting: Nurse Practitioner

## 2021-03-23 DIAGNOSIS — R898 Other abnormal findings in specimens from other organs, systems and tissues: Secondary | ICD-10-CM | POA: Insufficient documentation

## 2021-03-24 ENCOUNTER — Other Ambulatory Visit: Payer: Self-pay

## 2021-03-24 ENCOUNTER — Ambulatory Visit (INDEPENDENT_AMBULATORY_CARE_PROVIDER_SITE_OTHER): Payer: Medicaid Other | Admitting: Nurse Practitioner

## 2021-03-24 VITALS — BP 119/72 | HR 90 | Wt 194.5 lb

## 2021-03-24 DIAGNOSIS — R898 Other abnormal findings in specimens from other organs, systems and tissues: Secondary | ICD-10-CM

## 2021-03-24 DIAGNOSIS — Z3A19 19 weeks gestation of pregnancy: Secondary | ICD-10-CM

## 2021-03-24 DIAGNOSIS — R102 Pelvic and perineal pain: Secondary | ICD-10-CM

## 2021-03-24 DIAGNOSIS — O26892 Other specified pregnancy related conditions, second trimester: Secondary | ICD-10-CM

## 2021-03-24 DIAGNOSIS — O23591 Infection of other part of genital tract in pregnancy, first trimester: Secondary | ICD-10-CM

## 2021-03-24 DIAGNOSIS — F439 Reaction to severe stress, unspecified: Secondary | ICD-10-CM

## 2021-03-24 DIAGNOSIS — Z3490 Encounter for supervision of normal pregnancy, unspecified, unspecified trimester: Secondary | ICD-10-CM

## 2021-03-24 DIAGNOSIS — A5901 Trichomonal vulvovaginitis: Secondary | ICD-10-CM

## 2021-03-24 DIAGNOSIS — O219 Vomiting of pregnancy, unspecified: Secondary | ICD-10-CM

## 2021-03-24 NOTE — Progress Notes (Signed)
    Subjective:  Veronica Jones is a 30 y.o. G3P1011 at [redacted]w[redacted]d being seen today for ongoing prenatal care.  She is currently monitored for the following issues for this low-risk pregnancy and has History of ectopic pregnancy; Trichomonal vaginitis during pregnancy in first trimester; Supervision of low-risk pregnancy; Pap smear abnormality of cervix with LGSIL; Rubella non-immune status, antepartum; and Pseudothrombocytopenia on their problem list.  Patient reports  pelvic pain .  Contractions: Not present. Vag. Bleeding: None.  Movement: Present. Denies leaking of fluid.   The following portions of the patient's history were reviewed and updated as appropriate: allergies, current medications, past family history, past medical history, past social history, past surgical history and problem list. Problem list updated.  Objective:   Vitals:   03/24/21 0847  BP: 119/72  Pulse: 90  Weight: 194 lb 8 oz (88.2 kg)    Fetal Status: Fetal Heart Rate (bpm): 153 Fundal Height: 18 cm Movement: Present     General:  Alert, oriented and cooperative. Patient is in no acute distress.  Skin: Skin is warm and dry. No rash noted.   Cardiovascular: Normal heart rate noted  Respiratory: Normal respiratory effort, no problems with respiration noted  Abdomen: Soft, gravid, appropriate for gestational age. Pain/Pressure: Present     Pelvic:  Cervical exam deferred        Extremities: Normal range of motion.  Edema: None  Mental Status: Normal mood and affect. Normal behavior. Normal judgment and thought content.   Urinalysis:      Assessment and Plan:  Pregnancy: G3P1011 at [redacted]w[redacted]d  1. Encounter for supervision of low-risk pregnancy, antepartum Having lots of pain Does not want to use tylenol due to ads on TV about causing autism - discussed with patient  - ads are from a law firm and no medical evidence that tylenol causes autism, however her problems with pain are so severe that tylenol may not provide the  relief that she needs. Reviewed PP contraception - does not want more children - advised to consider IUD and info given in AVS May want a waterbirth - advised to take waterbirth class Did not breastfeed last time - "tried" to breastfeed, advised to take breastfeeding class  2. Pelvic pain affecting pregnancy in second trimester, antepartum Having pain with sitting, standing and walking Will refer for PT as her issues are so pervasive and causing her problems at work. Reviewed some exercises, advised exercise ball. Advised going to a pool once a week for more exercise.  - Ambulatory referral to Physical Therapy  3. Nausea and vomiting during pregnancy No longer a problem  4. Pseudothrombocytopenia Seeing hematologist - Is aware of her next appointment Has added iron and B12 supplements and is taking her prenatal vitamins  5. [redacted] weeks gestation of pregnancy   Preterm labor symptoms and general obstetric precautions including but not limited to vaginal bleeding, contractions, leaking of fluid and fetal movement were reviewed in detail with the patient. Please refer to After Visit Summary for other counseling recommendations.  Return in about 4 weeks (around 04/21/2021) for in person ROB.  Nolene Bernheim, RN, MSN, NP-BC Nurse Practitioner, [facility] for [employer], [facility] 03/24/2021 9:20 AM

## 2021-03-24 NOTE — Patient Instructions (Signed)
[email or web address] for classes Waterbirth class Breastfeeding classes

## 2021-03-24 NOTE — Progress Notes (Signed)
Pt concerned that Tylenol can cause Autism, so is not willing to take if needed for pain.

## 2021-03-24 NOTE — Addendum Note (Signed)
Addended by: Faythe Casa on: 03/24/2021 10:26 AM   Modules accepted: Orders

## 2021-04-07 ENCOUNTER — Telehealth: Payer: Medicaid Other | Admitting: Nurse Practitioner

## 2021-04-07 DIAGNOSIS — Z20818 Contact with and (suspected) exposure to other bacterial communicable diseases: Secondary | ICD-10-CM

## 2021-04-07 MED ORDER — CEPHALEXIN 500 MG PO CAPS: 500.0000 mg | ORAL_CAPSULE | Freq: Two times a day (BID) | ORAL | 0 refills | Status: DC

## 2021-04-07 NOTE — Progress Notes (Signed)
Virtual Visit Consent   Veronica Jones, you are scheduled for a virtual visit with Mary-Margaret Hassell Done, [place], a [facility] provider, today.     Just as with appointments in the office, your consent must be obtained to participate.  Your consent will be active for this visit and any virtual visit you may have with one of our providers in the next 365 days.     If you have a MyChart account, a copy of this consent can be sent to you electronically.  All virtual visits are billed to your insurance company just like a traditional visit in the office.    As this is a virtual visit, video technology does not allow for your provider to perform a traditional examination.  This may limit your provider's ability to fully assess your condition.  If your provider identifies any concerns that need to be evaluated in person or the need to arrange testing (such as labs, EKG, etc.), we will make arrangements to do so.     Although advances in technology are sophisticated, we cannot ensure that it will always work on either your end or our end.  If the connection with a video visit is poor, the visit may have to be switched to a telephone visit.  With either a video or telephone visit, we are not always able to ensure that we have a secure connection.     I need to obtain your verbal consent now.   Are you willing to proceed with your visit today? YES   Veronica Jones has provided verbal consent on 04/07/2021 for a virtual visit (video or telephone).   Mary-Margaret Hassell Done, FNP   Date: 04/07/2021 6:40 PM   Virtual Visit via Video Note   I, Mary-Margaret Hassell Done, connected with Veronica Jones ([id number], 22-Nov-1990) on 04/07/21 at  6:45 PM EDT by a video-enabled telemedicine application and verified that I am speaking with the correct person using two identifiers.  Location: Patient: \ Provider: Virtual Visit Location Provider: Mobile   I discussed the limitations of evaluation and management by  telemedicine and the availability of in person appointments. The patient expressed understanding and agreed to proceed.    History of Present Illness: Veronica Jones is a 30 y.o. who identifies as a female who was assigned female at birth, and is being seen today for sore thorat.  HPI: Patient says her daughter was diagnosed with strep throat today. The patient says she has sore throat and congestion. Patient is [redacted] weeks pregnant.  Review of Systems  Constitutional:  Positive for malaise/fatigue. Negative for chills and fever.  HENT:  Positive for congestion and sore throat.   Respiratory:  Positive for cough (slight).   Neurological:  Negative for headaches.   Problems:  Patient Active Problem List   Diagnosis Date Noted   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   Rubella non-immune status, antepartum 03/01/2021   Pap smear abnormality of cervix with LGSIL 02/27/2021   Supervision of low-risk pregnancy 12/28/2020   Trichomonal vaginitis during pregnancy in first trimester 12/06/2020   History of ectopic pregnancy 03/12/2020    Allergies:  Allergies  Allergen Reactions   Amoxicillin Hives   Penicillins Hives    Has patient had a PCN reaction causing immediate rash, facial/tongue/throat swelling, SOB or lightheadedness with hypotension: No Has patient had a PCN reaction causing severe rash involving mucus membranes or skin necrosis: No Has patient had a PCN reaction that required hospitalization No Has patient had a PCN reaction occurring within the  last 10 years: No If all of the above answers are "NO", then may proceed with Cephalosporin use.    Medications:  Current Outpatient Medications:    Blood Pressure Monitoring (BLOOD PRESSURE KIT) DEVI, 1 Device by Does not apply route as needed., Disp: 1 each, Rfl: 0   Misc. Devices (GOJJI WEIGHT SCALE) MISC, 1 Device by Does not apply route as needed., Disp: 1 each, Rfl: 0   Prenatal Vit-Fe Fumarate-FA (PREPLUS) 27-1 MG TABS, Take 1 tablet  by mouth daily., Disp: , Rfl:   Observations/Objective: Patient is well-developed, well-nourished in no acute distress.  Resting comfortably  at home.  Head is normocephalic, atraumatic.  No labored breathing.  Speech is clear and coherent with logical content.  Patient is alert and oriented at baseline.    Assessment and Plan:  Veronica Jones in today with chief complaint of strep throat  1. Streptococcus exposure Robitussin OTC for congestion 'force fluids  Meds ordered this encounter  Medications   cephALEXin (KEFLEX) 500 MG capsule    Sig: Take 1 capsule (500 mg total) by mouth 2 (two) times daily.    Dispense:  14 capsule    Refill:  0    Order Specific Question:   Supervising Provider    Answer:   Sabra Heck, BRIAN [[id number]]   Discusssed PCN allergy- and possibly cross allergy with keflex.       Follow Up Instructions: I discussed the assessment and treatment plan with the patient. The patient was provided an opportunity to ask questions and all were answered. The patient agreed with the plan and demonstrated an understanding of the instructions.  A copy of instructions were sent to the patient via MyChart.  The patient was advised to call back or seek an in-person evaluation if the symptoms worsen or if the condition fails to improve as anticipated.  Time:  I spent 11 minutes with the patient via telehealth technology discussing the above problems/concerns.    Mary-Margaret Hassell Done, FNP

## 2021-04-12 ENCOUNTER — Ambulatory Visit: Payer: Medicaid Other | Attending: Obstetrics

## 2021-04-12 ENCOUNTER — Other Ambulatory Visit: Payer: Self-pay | Admitting: *Deleted

## 2021-04-12 ENCOUNTER — Encounter: Payer: Self-pay | Admitting: *Deleted

## 2021-04-12 ENCOUNTER — Other Ambulatory Visit: Payer: Self-pay

## 2021-04-12 ENCOUNTER — Ambulatory Visit: Payer: Medicaid Other | Admitting: *Deleted

## 2021-04-12 VITALS — BP 118/59 | HR 87

## 2021-04-12 DIAGNOSIS — R898 Other abnormal findings in specimens from other organs, systems and tissues: Secondary | ICD-10-CM | POA: Insufficient documentation

## 2021-04-12 DIAGNOSIS — O99322 Drug use complicating pregnancy, second trimester: Secondary | ICD-10-CM | POA: Diagnosis not present

## 2021-04-12 DIAGNOSIS — Z3492 Encounter for supervision of normal pregnancy, unspecified, second trimester: Secondary | ICD-10-CM

## 2021-04-12 DIAGNOSIS — O99112 Other diseases of the blood and blood-forming organs and certain disorders involving the immune mechanism complicating pregnancy, second trimester: Secondary | ICD-10-CM

## 2021-04-12 DIAGNOSIS — O99012 Anemia complicating pregnancy, second trimester: Secondary | ICD-10-CM

## 2021-04-12 DIAGNOSIS — F1210 Cannabis abuse, uncomplicated: Secondary | ICD-10-CM

## 2021-04-12 DIAGNOSIS — Z362 Encounter for other antenatal screening follow-up: Secondary | ICD-10-CM | POA: Insufficient documentation

## 2021-04-12 DIAGNOSIS — D649 Anemia, unspecified: Secondary | ICD-10-CM

## 2021-04-12 DIAGNOSIS — Z3A22 22 weeks gestation of pregnancy: Secondary | ICD-10-CM

## 2021-04-21 ENCOUNTER — Ambulatory Visit (INDEPENDENT_AMBULATORY_CARE_PROVIDER_SITE_OTHER): Payer: Medicaid Other | Admitting: Obstetrics and Gynecology

## 2021-04-21 ENCOUNTER — Other Ambulatory Visit: Payer: Self-pay

## 2021-04-21 VITALS — BP 126/50 | HR 79 | Wt 196.5 lb

## 2021-04-21 DIAGNOSIS — R898 Other abnormal findings in specimens from other organs, systems and tissues: Secondary | ICD-10-CM

## 2021-04-21 DIAGNOSIS — Z8759 Personal history of other complications of pregnancy, childbirth and the puerperium: Secondary | ICD-10-CM

## 2021-04-21 DIAGNOSIS — Z3492 Encounter for supervision of normal pregnancy, unspecified, second trimester: Secondary | ICD-10-CM

## 2021-04-21 NOTE — Progress Notes (Signed)
   PRENATAL VISIT NOTE  Subjective:  Veronica Jones is a 30 y.o. G3P1011 at [redacted]w[redacted]d being seen today for ongoing prenatal care.  She is currently monitored for the following issues for this low-risk pregnancy and has History of eclampsia; Trichomonal vaginitis during pregnancy in first trimester; Supervision of low-risk pregnancy; Pap smear abnormality of cervix with LGSIL; Rubella non-immune status, antepartum; and Pseudothrombocytopenia on their problem list.  Patient reports no complaints.  Contractions: Not present.  .  Movement: Present. Denies leaking of fluid.   The following portions of the patient's history were reviewed and updated as appropriate: allergies, current medications, past family history, past medical history, past social history, past surgical history and problem list.   Objective:   Vitals:   04/21/21 1326  BP: (!) 126/50  Pulse: 79  Weight: 196 lb 8 oz (89.1 kg)    Fetal Status: Fetal Heart Rate (bpm): 138 l   Movement: Present     General:  Alert, oriented and cooperative. Patient is in no acute distress.  Skin: Skin is warm and dry. No rash noted.   Cardiovascular: Normal heart rate noted  Respiratory: Normal respiratory effort, no problems with respiration noted  Abdomen: Soft, gravid, appropriate for gestational age.  Pain/Pressure: Absent     Pelvic: Cervical exam deferred        Extremities: Normal range of motion.  Edema: Trace  Mental Status: Normal mood and affect. Normal behavior. Normal judgment and thought content.   Assessment and Plan:  Pregnancy: G3P1011 at [redacted]w[redacted]d  1. History of eclampsia  - Patient had PP seizure with last baby in 2016- see note in chart  - Protein / creatinine ratio, urine- baseline today  - Start BASA  - BP good today  - Reviewed risks, S/s of pre E and Eclampsia   2. Pseudothrombocytopenia Going to see hematology this month.   3. Encounter for supervision of low-risk pregnancy in second trimester  Doing well No  complaints.   Preterm labor symptoms and general obstetric precautions including but not limited to vaginal bleeding, contractions, leaking of fluid and fetal movement were reviewed in detail with the patient. Please refer to After Visit Summary for other counseling recommendations.   Return in about 4 weeks (around 05/19/2021), or 2 hour GTT with MD, hx of Eclampsia.  Future Appointments  Date Time Provider Department Center  04/29/2021  3:00 PM Theressa Millard, PT [facility]  05/04/2021  1:45 PM [facility] None  05/04/2021  2:20 PM Johney Maine, MD [facility] None  05/11/2021  4:00 PM Theressa Millard, PT [facility]  05/18/2021  4:00 PM Theressa Millard, PT [facility]  05/24/2021 11:15 AM [facility] NURSE [facility]  05/24/2021 11:30 AM [facility] US3 [facility]  05/25/2021  4:00 PM Theressa Millard, PT [facility]  06/01/2021  4:00 PM Theressa Millard, PT [facility]  06/08/2021  4:00 PM Theressa Millard, PT [facility]    Venia Carbon, NP

## 2021-04-22 LAB — PROTEIN / CREATININE RATIO, URINE
Creatinine, Urine: 152.3 mg/dL
Protein, Ur: 12.6 mg/dL
Protein/Creat Ratio: 83 mg/g creat (ref 0–200)

## 2021-04-29 ENCOUNTER — Encounter: Payer: Self-pay | Admitting: Physical Therapy

## 2021-04-29 ENCOUNTER — Other Ambulatory Visit: Payer: Self-pay

## 2021-04-29 ENCOUNTER — Encounter: Payer: Medicaid Other | Attending: Nurse Practitioner | Admitting: Physical Therapy

## 2021-04-29 DIAGNOSIS — R102 Pelvic and perineal pain: Secondary | ICD-10-CM | POA: Insufficient documentation

## 2021-04-29 DIAGNOSIS — O26892 Other specified pregnancy related conditions, second trimester: Secondary | ICD-10-CM | POA: Diagnosis not present

## 2021-04-29 DIAGNOSIS — M6281 Muscle weakness (generalized): Secondary | ICD-10-CM | POA: Diagnosis present

## 2021-04-29 DIAGNOSIS — M545 Low back pain, unspecified: Secondary | ICD-10-CM | POA: Diagnosis present

## 2021-04-29 NOTE — Therapy (Signed)
[facility] [address] [place], [place], [address] Phone: [phone]   Fax:  [phone]  Physical Therapy Treatment  Patient Details  Name: Veronica Jones MRN: [id number] Date of Birth: March 17, 1991 Referring Provider (PT): Nolene Bernheim, NP   Encounter Date: 04/29/2021   PT End of Session - 04/29/21 1536     Visit Number 1    Date for PT Re-Evaluation 06/10/21    Authorization Type Medicaid    PT Start Time 1500    PT Stop Time 1530    PT Time Calculation (min) 30 min    Activity Tolerance Patient tolerated treatment well;No increased pain    Behavior During Therapy [facility] for tasks assessed/performed             Past Medical History:  Diagnosis Date   Chlamydia 2013   Hx of trichomoniasis    Postpartum eclampsia 04/19/2015    Past Surgical History:  Procedure Laterality Date   NO PAST SURGERIES      There were no vitals filed for this visit.   Subjective Assessment - 04/29/21 1503     Subjective Patient was having pelvic pain when she walks and goes to work. Her left leg goes numb. Pain in left back. Symptoms have been going on since July.    Patient Stated Goals reduce pain, how to manage pain    Currently in Pain? Yes    Pain Score 6     Pain Location Pelvis    Pain Orientation Mid    Pain Descriptors / Indicators Sore    Pain Type Acute pain    Pain Onset More than a month ago    Pain Frequency Intermittent    Aggravating Factors  walking, too much movement    Pain Relieving Factors sit down and rock pelvis    Multiple Pain Sites Yes    Pain Score 6    Pain Location Back    Pain Orientation Left    Pain Descriptors / Indicators Other (Comment)   clicking pain   Pain Type Chronic pain    Pain Radiating Towards numbness in left leg    Pain Onset More than a month ago    Pain Frequency Constant    Aggravating Factors  beinding over, squat to fix a table    Pain Relieving Factors bend  backwards                [facility] PT Assessment - 04/29/21 0001       Assessment   Medical Diagnosis O26.892; R10.2 Pelvic pain affecting pregnancy iin second trimester, antepartum    Referring Provider (PT) Nolene Bernheim, NP    Onset Date/Surgical Date 12/11/20    Prior Therapy none      Precautions   Precautions Other (comment)    Precaution Comments pregnant      Restrictions   Weight Bearing Restrictions No      Balance Screen   Has the patient fallen in the past 6 months No    Has the patient had a decrease in activity level because of a fear of falling?  No    Is the patient reluctant to leave their home because of a fear of falling?  No      Prior Function   Level of Independence Independent    Vocation Part time employment    Therapist, occupational service    Leisure walking      Cognition   Overall  Cognitive Status Within Functional Limits for tasks assessed      Posture/Postural Control   Posture/Postural Control Postural limitations    Posture Comments pregnant posture      ROM / Strength   AROM / PROM / Strength AROM;PROM;Strength      AROM   Overall AROM Comments lumbar ROM is full      PROM   Right Hip Internal Rotation  20    Left Hip Internal Rotation  25      Strength   Right Hip ABduction 4/5    Left Hip ABduction 4-/5      Palpation   Spinal mobility tightness in L2-L4    SI assessment  tightness in bilateral SI joint    Palpation comment tenderness located on right SI joint, along the bilateral levator ani right worse than left                        Pelvic Floor Special Questions - 04/29/21 0001     Prior Pregnancies Yes    Number of Pregnancies 2   pregnant right now   Number of Vaginal Deliveries 1    Currently Sexually Active Yes    Is this Painful No    Urinary Leakage Yes    Activities that cause leaking Sneezing    Fecal incontinence No                        PT Education -  04/29/21 1534     Education Details educated patient on using a tennis ball to massage the pelvic floor muscles    Person(s) Educated Patient    Methods Explanation    Comprehension Verbalized understanding;Returned demonstration              PT Short Term Goals - 04/29/21 1544       PT SHORT TERM GOAL #1   Title independent with initial HEP    Baseline noe educated yet    Time 3    Period Weeks    Status New    Target Date 05/20/21               PT Long Term Goals - 04/29/21 1545       PT LONG TERM GOAL #1   Title independent with  HEP    Baseline not educated yet    Time 6    Period Weeks    Status New    Target Date 06/10/21      PT LONG TERM GOAL #2   Title pain with walking to work decreased </= 3/10 due to improved mobility    Baseline pain level 6/10    Time 6    Period Weeks    Status New    Target Date 06/10/21      PT LONG TERM GOAL #3   Title pelvic pain with bending foward to open a drawer at work decreased </= 3/10 due to improve stability of lumbar sacral area    Baseline pain level 6/10    Time 6    Period Weeks    Status New    Target Date 06/10/21      PT LONG TERM GOAL #4   Title understand differnet ways to give birth to reduce strain on back and pelvis with correct breath    Baseline not educated yet    Time 6    Period Weeks    Status  New    Target Date 06/10/21                   Plan - 04/29/21 1537     Clinical Impression Statement Patient is a 30 year old female with pelvic pain and presently pregnant. She  is due 08/13/2021. Patient reports her pelvic and left back pain is 6.10 with walking, bending forward or too much movement. Bilateral hip abduction strength isright 4/5 and left 4-/5. Decreased movement of L2-L4 and bilateral SI joint. Paspable tenderness located on bilateral SI joint, Lumbar, and levator ani. Internal hip rotation on the right is 20 degrees and left is 25 degrees. Patient reports she will leak  with sneezing. Patient will benefit from skilled therapy to reduce her pain and improve mobility.    Personal Factors and Comorbidities Comorbidity 1;Fitness;Profession    Comorbidities Presently pregnanat and due 08/13/21    Examination-Activity Limitations Continence;Locomotion Level;Stand    Stability/Clinical Decision Making Stable/Uncomplicated    Clinical Decision Making Low    Rehab Potential Excellent    PT Frequency 1x / week    PT Duration 6 weeks    PT Treatment/Interventions ADLs/Self Care Home Management;Moist Heat;Therapeutic activities;Therapeutic exercise;Neuromuscular re-education;Patient/family education;Manual techniques;Taping;Spinal Manipulations    PT Next Visit Plan body mechanics with work tasks; soft tissue work to [employer], along the lumbar paraspinals, bil. SI joint; Ta contraction; trunk rotation; quadruped    Consulted and Agree with Plan of Care Patient             Patient will benefit from skilled therapeutic intervention in order to improve the following deficits and impairments:  Pain, Decreased activity tolerance, Decreased range of motion, Increased muscle spasms, Decreased strength  Visit Diagnosis: Pelvic pain - Plan: PT plan of care cert/re-cert  Muscle weakness (generalized) - Plan: PT plan of care cert/re-cert  Acute left-sided low back pain without sciatica - Plan: PT plan of care cert/re-cert     Problem List Patient Active Problem List   Diagnosis Date Noted   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   Rubella non-immune status, antepartum 03/01/2021   Pap smear abnormality of cervix with LGSIL 02/27/2021   Supervision of low-risk pregnancy 12/28/2020   Trichomonal vaginitis during pregnancy in first trimester 12/06/2020   History of eclampsia 03/12/2020    Eulis Foster, PT 04/29/21 3:50 PM  [facility] [address] [place], [place], [address] Phone: [phone]   Fax:   [phone]  Name: Jinnifer Montejano MRN: [id number] Date of Birth: Jun 15, 1990

## 2021-05-03 ENCOUNTER — Other Ambulatory Visit: Payer: Self-pay

## 2021-05-03 DIAGNOSIS — D696 Thrombocytopenia, unspecified: Secondary | ICD-10-CM

## 2021-05-04 ENCOUNTER — Other Ambulatory Visit: Payer: Self-pay

## 2021-05-04 ENCOUNTER — Inpatient Hospital Stay (HOSPITAL_BASED_OUTPATIENT_CLINIC_OR_DEPARTMENT_OTHER): Payer: Medicaid Other | Admitting: Hematology

## 2021-05-04 ENCOUNTER — Inpatient Hospital Stay: Payer: Medicaid Other | Attending: Hematology

## 2021-05-04 VITALS — BP 134/62 | HR 72 | Temp 97.9°F | Resp 17 | Ht 64.0 in | Wt 195.8 lb

## 2021-05-04 DIAGNOSIS — D696 Thrombocytopenia, unspecified: Secondary | ICD-10-CM | POA: Insufficient documentation

## 2021-05-04 DIAGNOSIS — O99112 Other diseases of the blood and blood-forming organs and certain disorders involving the immune mechanism complicating pregnancy, second trimester: Secondary | ICD-10-CM | POA: Diagnosis present

## 2021-05-04 LAB — CBC WITH DIFFERENTIAL (CANCER CENTER ONLY)
Abs Immature Granulocytes: 0.04 10*3/uL (ref 0.00–0.07)
Basophils Absolute: 0 10*3/uL (ref 0.0–0.1)
Basophils Relative: 0 %
Eosinophils Absolute: 0.1 10*3/uL (ref 0.0–0.5)
Eosinophils Relative: 1 %
HCT: 31.9 % — ABNORMAL LOW (ref 36.0–46.0)
Hemoglobin: 10.8 g/dL — ABNORMAL LOW (ref 12.0–15.0)
Immature Granulocytes: 1 %
Lymphocytes Relative: 26 %
Lymphs Abs: 2.3 10*3/uL (ref 0.7–4.0)
MCH: 31.7 pg (ref 26.0–34.0)
MCHC: 33.9 g/dL (ref 30.0–36.0)
MCV: 93.5 fL (ref 80.0–100.0)
Monocytes Absolute: 0.7 10*3/uL (ref 0.1–1.0)
Monocytes Relative: 8 %
Neutro Abs: 5.5 10*3/uL (ref 1.7–7.7)
Neutrophils Relative %: 64 %
Platelet Count: 121 10*3/uL — ABNORMAL LOW (ref 150–400)
RBC: 3.41 MIL/uL — ABNORMAL LOW (ref 3.87–5.11)
RDW: 13.9 % (ref 11.5–15.5)
WBC Count: 8.7 10*3/uL (ref 4.0–10.5)
nRBC: 0 % (ref 0.0–0.2)

## 2021-05-04 LAB — CMP (CANCER CENTER ONLY)
ALT: 9 U/L (ref 0–44)
AST: 13 U/L — ABNORMAL LOW (ref 15–41)
Albumin: 3.1 g/dL — ABNORMAL LOW (ref 3.5–5.0)
Alkaline Phosphatase: 63 U/L (ref 38–126)
Anion gap: 9 (ref 5–15)
BUN: 8 mg/dL (ref 6–20)
CO2: 21 mmol/L — ABNORMAL LOW (ref 22–32)
Calcium: 8.6 mg/dL — ABNORMAL LOW (ref 8.9–10.3)
Chloride: 109 mmol/L (ref 98–111)
Creatinine: 0.67 mg/dL (ref 0.44–1.00)
GFR, Estimated: >60 mL/min (ref >60)
Glucose, Bld: 82 mg/dL (ref 70–99)
Potassium: 3.8 mmol/L (ref 3.5–5.1)
Sodium: 139 mmol/L (ref 135–145)
Total Bilirubin: 0.4 mg/dL (ref 0.3–1.2)
Total Protein: 6.5 g/dL (ref 6.5–8.1)

## 2021-05-04 LAB — IMMATURE PLATELET FRACTION: Immature Platelet Fraction: 20.7 % — ABNORMAL HIGH (ref 1.2–8.6)

## 2021-05-04 LAB — IRON AND TIBC
Iron: 57 ug/dL (ref 41–142)
Saturation Ratios: 12 % — ABNORMAL LOW (ref 21–57)
TIBC: 478 ug/dL — ABNORMAL HIGH (ref 236–444)
UIBC: 421 ug/dL — ABNORMAL HIGH (ref 120–384)

## 2021-05-04 LAB — FERRITIN: Ferritin: 8 ng/mL — ABNORMAL LOW (ref 11–307)

## 2021-05-05 LAB — FOLATE RBC
Folate, Hemolysate: 426 ng/mL
Folate, RBC: 1279 ng/mL (ref >498)
Hematocrit: 33.3 % — ABNORMAL LOW (ref 34.0–46.6)

## 2021-05-07 ENCOUNTER — Telehealth: Payer: Self-pay | Admitting: Hematology

## 2021-05-07 NOTE — Telephone Encounter (Signed)
Unable to leave message with follow-up appointments per 11/22 los. Mailed calendar.

## 2021-05-10 ENCOUNTER — Other Ambulatory Visit: Payer: Self-pay

## 2021-05-10 DIAGNOSIS — F4329 Adjustment disorder with other symptoms: Secondary | ICD-10-CM

## 2021-05-11 ENCOUNTER — Encounter: Payer: Medicaid Other | Admitting: Physical Therapy

## 2021-05-12 NOTE — BH Specialist Note (Signed)
Integrated Behavioral Health via Telemedicine Visit  05/12/2021 Vincent Ehrler [id number]  Number of Integrated Behavioral Health visits: 3 Session Start time: 9:18  Session End time: 9:45 Total time:  27  Referring Provider: Mariel Aloe, MD Patient/Family location: Home [facility] Provider location: Center for [facility]  All persons participating in visit: [name] and [facility] Chelsey Redondo   Types of Service: Individual psychotherapy and Video visit  I connected with [name] and/or Karita Cranor's  n/a  via  Telephone or Video Enabled Telemedicine Application  (Video is Caregility application) and verified that I am speaking with the correct person using two identifiers. Discussed confidentiality: Yes   I discussed the limitations of telemedicine and the availability of in person appointments.  Discussed there is a possibility of technology failure and discussed alternative modes of communication if that failure occurs.  I discussed that engaging in this telemedicine visit, they consent to the provision of behavioral healthcare and the services will be billed under their insurance.  Patient and/or legal guardian expressed understanding and consented to Telemedicine visit: Yes   Presenting Concerns: Patient and/or family reports the following symptoms/concerns: Loss of brother to suicide on 05/08/21; poor sleep and poor appetite since loss; also "stomach really tight for hours and uncomfortable" yesterday, did not eat yesterday. Today, bringing Ensures to first day back to work to keep up nutrition.    Patient and/or Family's Strengths/Protective Factors: Social connections, Concrete supports in place (healthy food, safe environments, etc.), and Sense of purpose  Goals Addressed: Patient will:  Demonstrate ability to:  Continue healthy grieving over loss  Progress towards Goals: Ongoing  Interventions: Interventions  utilized:  Link to [employer] and Supportive Reflection Standardized Assessments completed: Not Needed  Patient and/or Family Response: Pt agrees with treatment plan  Assessment: Patient currently experiencing Grief.   Patient may benefit from psychoeducation and brief therapeutic interventions.  Plan: Follow up with behavioral health clinician on : Three weeks Behavioral recommendations:  -Continue allowing time and space to grieve, as discussed -Consider additional grief support for self and family (on AVS, as needed) Referral(s): Integrated Art gallery manager (In Clinic) and [employer]:  grief support  I discussed the assessment and treatment plan with the patient and/or parent/guardian. They were provided an opportunity to ask questions and all were answered. They agreed with the plan and demonstrated an understanding of the instructions.   They were advised to call back or seek an in-person evaluation if the symptoms worsen or if the condition fails to improve as anticipated.  Rae Lips, LCSW  Depression screen [facility] 2/9 04/21/2021 03/26/2021 02/25/2021 01/28/2021 12/11/2020  Decreased Interest 0 0 1 0 1  Down, Depressed, Hopeless 0 0 0 0 0  PHQ - 2 Score 0 0 1 0 1  Altered sleeping 1 1 2 1  0  Tired, decreased energy 0 1 2 1 1   Change in appetite 0 0 0 1 0  Feeling bad or failure about yourself  0 0 0 0 0  Trouble concentrating 0 0 0 0 0  Moving slowly or fidgety/restless 0 0 0 0 0  Suicidal thoughts 0 0 0 0 0  PHQ-9 Score 1 2 5 3 2   Difficult doing work/chores - - - - -   GAD 7 : Generalized Anxiety Score 04/21/2021 03/26/2021 02/25/2021 01/28/2021  Nervous, Anxious, on Edge 1 0 0 0  Control/stop worrying 0 0 0 0  Worry too much - different  things 0 0 0 0  Trouble relaxing 1 0 1 0  Restless 0 0 0 0  Easily annoyed or irritable 1 1 0 0  Afraid - awful might happen 0 0 0 0  Total GAD 7 Score 3 1 1  0

## 2021-05-13 NOTE — Progress Notes (Signed)
Marland Kitchen   HEMATOLOGY/ONCOLOGY CONSULTATION NOTE  Date of Service: 05/13/2021  Patient Care Team: Patient, No Pcp Per (Inactive) as PCP - General (General Practice)  CHIEF COMPLAINTS/PURPOSE OF CONSULTATION:  Thrombocytopenia in pregnancy  HISTORY OF PRESENTING ILLNESS:   Veronica Jones is a wonderful 30 y.o. female who has been referred to [place] by Dr .Lynnda Shields MD for evaluation and management of thrombocytopenia in pregnancy.  Patient is currently about [redacted] weeks pregnant.  She is G3 P1 A1  Notes having some mild thrombocytopenia with her previous pregnancy but this was not an issue. Denies any issues with bleeding or abnormal bruising. No recent infections fevers chills night sweats.  Denies any over-the-counter medications other than prenatal vitamins. No family history of blood disorders.  Labs done early in pregnancy on 01/28/2021 with her OB/GYN doctor Showed CBC with hemoglobin of 12.9 MCV of 89 WBC count of 5.3k platelets of 119k with some aggregation of platelets noted in the sample.  Patient denies any previous history of ITP diagnosis or treatment.   INTERVAL HISTORY  Patient is here for follow-up of her thrombocytopenia in pregnancy.  She is currently at about [redacted] weeks pregnant and patient notes no acute new symptoms. No bleeding issues. Labs done today 05/04/2021 reviewed with the patient.  CBC shows stable platelet count 121k, hemoglobin of 10.8, WBC count of 8.7k. CMP stable Ferritin of 8 with an iron saturation of 12%.  Eating well no other acute new symptoms.  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  Tobacco Use   Smoking status: Former    Packs/day: 0.50    Years: 1.00    Pack years: 0.50    Types: Cigarettes    Quit date: 10/11/2020    Years since quitting: 0.5   Smokeless tobacco: Never  Vaping Use   Vaping Use: Never used  Substance and Sexual Activity   Alcohol use: Not Currently    Comment: not since pregnancy   Drug use: Yes    Types: Marijuana    Comment: last use 3 weeks ago (oct 10)   Sexual activity: Yes    Birth control/protection: None  Other Topics Concern   Not on file  Social History Narrative   Syla lives with her parents and two brothers in a house, all of which are smokers. She reports a good relationship with her family members and feels it is a safe place. She plans to continue living there with her infant post-partum.    Social Determinants of Health   Financial Resource Strain: Not on file  Food Insecurity: No Food Insecurity   Worried About Charity fundraiser in the Last Year: Never true   Ran Out of Food in the Last Year: Never true  Transportation Needs: No Transportation Needs   Lack of Transportation (Medical): No   Lack of Transportation (Non-Medical): No  Physical Activity: Not on file  Stress: Not on file  Social Connections: Not on file  Intimate Partner Violence: Not on file    FAMILY HISTORY: Family History  Problem Relation Age of Onset   Hyperlipidemia Mother    Hypertension Mother    Hyperlipidemia Father    Hypertension Father    Diabetes Maternal Aunt    Hypertension Paternal Grandmother    Cancer Paternal Grandmother        breast   Diabetes Paternal Grandmother     ALLERGIES:  is allergic to amoxicillin and penicillins.  MEDICATIONS:  Current Outpatient Medications  Medication Sig Dispense Refill   Prenatal Vit-Fe Fumarate-FA (PREPLUS) 27-1 MG TABS Take 1 tablet by mouth daily.     Blood Pressure  Monitoring (BLOOD PRESSURE KIT) DEVI 1 Device by Does not apply route as needed. (Patient not taking: Reported on 04/21/2021) 1 each 0   cephALEXin (KEFLEX) 500 MG capsule Take 1 capsule (500 mg total) by mouth 2 (two) times daily. (Patient not taking: Reported on 05/04/2021) 14 capsule 0   Misc. Devices (GOJJI WEIGHT SCALE) MISC 1 Device by Does not apply route as needed. (Patient not taking: Reported on 04/21/2021) 1 each 0   ondansetron (ZOFRAN-ODT) 8 MG disintegrating tablet Take 8 mg by mouth every 8 (eight) hours as needed. (Patient not taking: Reported on 04/21/2021)     promethazine (PHENERGAN) 25 MG tablet Take 25 mg by mouth every 6 (six) hours as needed. (Patient not taking: Reported on 04/21/2021)     No current facility-administered medications for this visit.    REVIEW OF SYSTEMS:   .10 Point review of Systems was done is negative except as noted above.  PHYSICAL EXAMINATION: ECOG PERFORMANCE STATUS: 1 - Symptomatic but completely ambulatory  . Vitals:   05/04/21 1418  BP: 134/62  Pulse: 72  Resp: 17  Temp: 97.9 F (36.6 C)  SpO2: 100%   Filed Weights   05/04/21 1418  Weight: 195 lb 12.8 oz (88.8 kg)   .Body mass index is 33.61 kg/m. Marland Kitchen GENERAL:alert, in no acute distress and comfortable SKIN: no acute rashes, no significant lesions EYES: conjunctiva are pink and non-injected, sclera anicteric OROPHARYNX: MMM, no exudates, no oropharyngeal erythema or ulceration NECK: supple, no JVD LYMPH:  no palpable lymphadenopathy in the cervical, axillary or inguinal regions LUNGS: clear to auscultation b/l with normal respiratory effort HEART: regular rate & rhythm ABDOMEN:  normoactive bowel sounds , non tender, not distended. Extremity: no pedal edema PSYCH: alert & oriented x 3 with fluent speech NEURO: no focal motor/sensory deficits   LABORATORY DATA:  I have reviewed the data as listed  . CBC Latest Ref Rng & Units 05/04/2021 05/04/2021 03/03/2021  WBC 4.0 - 10.5  K/uL 8.7 - 8.7  Hemoglobin 12.0 - 15.0 g/dL 10.8(L) - 11.4(L)  Hematocrit 34.0 - 46.6 % 31.9(L) 33.3(L) 33.1(L)  Platelets 150 - 400 K/uL 121(L) - 132(L)    . CMP Latest Ref Rng & Units 05/04/2021 03/03/2021 03/12/2020  Glucose 70 - 99 mg/dL 82 85 92  BUN 6 - 20 mg/dL _0 Creatinine 0.44 - 1.00 mg/dL 0.67 0.61 0.79  Sodium 135 - 145 mmol/L 139 137 137  Potassium 3.5 - 5.1 mmol/L 3.8 3.4(L) 4.4  Chloride 98 - 111 mmol/L 109 107 106  CO2 22 - 32 mmol/L 21(L) 21(L) 22  Calcium 8.9 - 10.3 mg/dL 8.6(L) 8.7(L) 9.4  Total Protein 6.5 - 8.1 g/dL 6.5 6.5 7.1  Total Bilirubin 0.3 - 1.2 mg/dL 0.4 0.3 0.8  Alkaline Phos 38 -  126 U/L 63 44 34(L)  AST 15 - 41 U/L 13(L) 30 16  ALT 0 - 44 U/L 9 32 15     RADIOGRAPHIC STUDIES: I have personally reviewed the radiological images as listed and agreed with the findings in the report. No results found.  ASSESSMENT & PLAN:   30 year old female with  #1 mild thrombocytopenia in second trimester pregnancy #2 iron deficiency anemia in pregnancy Plan Labs done today 05/04/2021 reviewed with the patient.  CBC shows stable platelet count 121k, hemoglobin of 10.8, WBC count of 8.7k. CMP stable Ferritin of 8 with an iron saturation of 12%. -Unclear at this time if the patient just has gestational thrombocytopenia or if there is any other factor involved. -Recommended minimizing medications that can cause thrombocytopenia. -Lab work-up ordered including looking for vitamin deficiencies.  As noted above Note recommended patient stop taking iron polysaccharide 1 tablet 150 mg p.o. daily for anemia and iron deficiency in pregnancy. -No acute intervention indicated for patient's mild thrombocytopenia at this time. Follow-up Phone visit with Dr. Irene Limbo in 7 weeks Labs 1 day prior to phone visit . No orders of the defined types were placed in this encounter.    All of the patients questions were answered with apparent satisfaction. The patient knows to  call the clinic with any problems, questions or concerns.  . The total time spent in the appointment was 20 minutes and more than 50% was on counseling and direct patient cares.     Sullivan Lone MD [place] AAHIVMS [facility] Hematology/Oncology Physician [facility]

## 2021-05-17 ENCOUNTER — Other Ambulatory Visit: Payer: Self-pay | Admitting: *Deleted

## 2021-05-17 DIAGNOSIS — Z3490 Encounter for supervision of normal pregnancy, unspecified, unspecified trimester: Secondary | ICD-10-CM

## 2021-05-18 ENCOUNTER — Encounter: Payer: Medicaid Other | Admitting: Physical Therapy

## 2021-05-20 ENCOUNTER — Ambulatory Visit (INDEPENDENT_AMBULATORY_CARE_PROVIDER_SITE_OTHER): Payer: Medicaid Other | Admitting: Obstetrics and Gynecology

## 2021-05-20 ENCOUNTER — Other Ambulatory Visit: Payer: Self-pay

## 2021-05-20 ENCOUNTER — Other Ambulatory Visit: Payer: Medicaid Other

## 2021-05-20 VITALS — BP 114/78 | HR 78 | Wt 193.0 lb

## 2021-05-20 DIAGNOSIS — Z3492 Encounter for supervision of normal pregnancy, unspecified, second trimester: Secondary | ICD-10-CM

## 2021-05-20 DIAGNOSIS — Z3A27 27 weeks gestation of pregnancy: Secondary | ICD-10-CM | POA: Insufficient documentation

## 2021-05-20 DIAGNOSIS — Z8759 Personal history of other complications of pregnancy, childbirth and the puerperium: Secondary | ICD-10-CM

## 2021-05-20 DIAGNOSIS — O09899 Supervision of other high risk pregnancies, unspecified trimester: Secondary | ICD-10-CM

## 2021-05-20 DIAGNOSIS — Z3490 Encounter for supervision of normal pregnancy, unspecified, unspecified trimester: Secondary | ICD-10-CM

## 2021-05-20 DIAGNOSIS — Z2839 Other underimmunization status: Secondary | ICD-10-CM

## 2021-05-20 DIAGNOSIS — R898 Other abnormal findings in specimens from other organs, systems and tissues: Secondary | ICD-10-CM

## 2021-05-20 DIAGNOSIS — Z23 Encounter for immunization: Secondary | ICD-10-CM | POA: Diagnosis not present

## 2021-05-20 NOTE — Progress Notes (Signed)
   PRENATAL VISIT NOTE  Subjective:  Veronica Jones is a 30 y.o. G3P1011 at [redacted]w[redacted]d being seen today for ongoing prenatal care.  She is currently monitored for the following issues for this high-risk pregnancy and has History of eclampsia; Trichomonal vaginitis during pregnancy in first trimester; Supervision of low-risk pregnancy; Pap smear abnormality of cervix with LGSIL; Rubella non-immune status, antepartum; Pseudothrombocytopenia; and [redacted] weeks gestation of pregnancy on their problem list.  Patient doing well with no acute concerns today. She reports no complaints.  Contractions: Not present. Vag. Bleeding: None.  Movement: Present. Denies leaking of fluid.   The following portions of the patient's history were reviewed and updated as appropriate: allergies, current medications, past family history, past medical history, past social history, past surgical history and problem list. Problem list updated.  Objective:   Vitals:   05/20/21 0825  BP: 114/78  Pulse: 78  Weight: 193 lb (87.5 kg)    Fetal Status: Fetal Heart Rate (bpm): 133 Fundal Height: 28 cm Movement: Present     General:  Alert, oriented and cooperative. Patient is in no acute distress.  Skin: Skin is warm and dry. No rash noted.   Cardiovascular: Normal heart rate noted  Respiratory: Normal respiratory effort, no problems with respiration noted  Abdomen: Soft, gravid, appropriate for gestational age.  Pain/Pressure: Present     Pelvic: Cervical exam deferred        Extremities: Normal range of motion.  Edema: None  Mental Status:  Normal mood and affect. Normal behavior. Normal judgment and thought content.   Assessment and Plan:  Pregnancy: G3P1011 at [redacted]w[redacted]d  1. Encounter for supervision of low-risk pregnancy in second trimester Continue routine care - Tdap vaccine greater than or equal to 7yo IM - AMBULATORY REFERRAL TO [facility]  2. Pseudothrombocytopenia Will review plts with CBC done today  3.  Rubella non-immune status, antepartum Treat after delivery  4. History of eclampsia Pt is currently taking baby ASA  5. [redacted] weeks gestation of pregnancy   Preterm labor symptoms and general obstetric precautions including but not limited to vaginal bleeding, contractions, leaking of fluid and fetal movement were reviewed in detail with the patient.  Please refer to After Visit Summary for other counseling recommendations.   Return in about 2 weeks (around 06/03/2021) for [facility], virtual.   Mariel Aloe, MD Faculty Attending Center for [facility]

## 2021-05-21 ENCOUNTER — Telehealth: Payer: Self-pay

## 2021-05-21 LAB — CBC
Hematocrit: 34.5 % (ref 34.0–46.6)
Hemoglobin: 11.4 g/dL (ref 11.1–15.9)
MCH: 30.2 pg (ref 26.6–33.0)
MCHC: 33 g/dL (ref 31.5–35.7)
MCV: 91 fL (ref 79–97)
Platelets: 98 10*3/uL — CL (ref 150–450)
RBC: 3.78 x10E6/uL (ref 3.77–5.28)
RDW: 13 % (ref 11.7–15.4)
WBC: 6.4 10*3/uL (ref 3.4–10.8)

## 2021-05-21 LAB — HIV ANTIBODY (ROUTINE TESTING W REFLEX): HIV Screen 4th Generation wRfx: NONREACTIVE

## 2021-05-21 LAB — GLUCOSE TOLERANCE, 2 HOURS W/ 1HR
Glucose, 1 hour: 90 mg/dL (ref 70–179)
Glucose, 2 hour: 86 mg/dL (ref 70–152)
Glucose, Fasting: 80 mg/dL (ref 70–91)

## 2021-05-21 LAB — RPR: RPR Ser Ql: NONREACTIVE

## 2021-05-21 NOTE — Telephone Encounter (Addendum)
-----   Message from Warden Fillers, MD sent at 05/21/2021  8:33 AM EST ----- Plts 98 decreased from previous, advise follow up with hematologist  Called pt. Reviewed results and provider recommendation with patient. Pt states she will contact hematologist today.

## 2021-05-24 ENCOUNTER — Other Ambulatory Visit: Payer: Self-pay | Admitting: *Deleted

## 2021-05-24 ENCOUNTER — Ambulatory Visit: Payer: Medicaid Other | Admitting: *Deleted

## 2021-05-24 ENCOUNTER — Encounter: Payer: Self-pay | Admitting: *Deleted

## 2021-05-24 ENCOUNTER — Other Ambulatory Visit: Payer: Self-pay

## 2021-05-24 ENCOUNTER — Ambulatory Visit: Payer: Medicaid Other | Attending: Obstetrics and Gynecology

## 2021-05-24 VITALS — BP 123/70 | HR 99

## 2021-05-24 DIAGNOSIS — O99013 Anemia complicating pregnancy, third trimester: Secondary | ICD-10-CM | POA: Diagnosis not present

## 2021-05-24 DIAGNOSIS — Z3A28 28 weeks gestation of pregnancy: Secondary | ICD-10-CM

## 2021-05-24 DIAGNOSIS — O99113 Other diseases of the blood and blood-forming organs and certain disorders involving the immune mechanism complicating pregnancy, third trimester: Secondary | ICD-10-CM

## 2021-05-24 DIAGNOSIS — F1210 Cannabis abuse, uncomplicated: Secondary | ICD-10-CM | POA: Diagnosis not present

## 2021-05-24 DIAGNOSIS — Z3493 Encounter for supervision of normal pregnancy, unspecified, third trimester: Secondary | ICD-10-CM | POA: Insufficient documentation

## 2021-05-24 DIAGNOSIS — D696 Thrombocytopenia, unspecified: Secondary | ICD-10-CM

## 2021-05-24 DIAGNOSIS — O99323 Drug use complicating pregnancy, third trimester: Secondary | ICD-10-CM

## 2021-05-24 DIAGNOSIS — Z362 Encounter for other antenatal screening follow-up: Secondary | ICD-10-CM | POA: Diagnosis not present

## 2021-05-24 DIAGNOSIS — O99119 Other diseases of the blood and blood-forming organs and certain disorders involving the immune mechanism complicating pregnancy, unspecified trimester: Secondary | ICD-10-CM

## 2021-05-25 ENCOUNTER — Encounter: Payer: Self-pay | Admitting: Physical Therapy

## 2021-05-25 ENCOUNTER — Encounter: Payer: Medicaid Other | Attending: Nurse Practitioner | Admitting: Physical Therapy

## 2021-05-25 DIAGNOSIS — R102 Pelvic and perineal pain: Secondary | ICD-10-CM | POA: Diagnosis not present

## 2021-05-25 DIAGNOSIS — M545 Low back pain, unspecified: Secondary | ICD-10-CM | POA: Diagnosis present

## 2021-05-25 DIAGNOSIS — M6281 Muscle weakness (generalized): Secondary | ICD-10-CM | POA: Insufficient documentation

## 2021-05-25 NOTE — Patient Instructions (Signed)
Access Code: [email or web address] URL: [email or web address] Date: 05/25/2021 Prepared by: Eulis Foster  Exercises Supine Bridge - 1 x daily - 7 x weekly - 3 sets - 10 reps Quadruped Cat Cow - 1 x daily - 7 x weekly - 1 sets - 10 reps Seated Piriformis Stretch - 1 x daily - 7 x weekly - 1 sets - 2 reps - 30 sec hold Seated Hamstring Stretch - 1 x daily - 7 x weekly - 1 sets - 2 reps - 30 sec hold  Eulis Foster, PT [facility] [address], [address] [place], [place] [address] W: [phone] [email or web address]

## 2021-05-25 NOTE — Therapy (Signed)
[facility] [address] [place], [place], [address] Phone: [phone]   Fax:  [phone]  Physical Therapy Treatment  Patient Details  Name: Veronica Jones MRN: [id number] Date of Birth: 1991-02-28 Referring Provider (PT): Nolene Bernheim, NP   Encounter Date: 05/25/2021   PT End of Session - 05/25/21 1638     Visit Number 2    Date for PT Re-Evaluation 06/10/21    Authorization Type Medicaid    PT Start Time 1550    PT Stop Time 1635    PT Time Calculation (min) 45 min    Activity Tolerance Patient tolerated treatment well;No increased pain    Behavior During Therapy [facility] for tasks assessed/performed             Past Medical History:  Diagnosis Date   Chlamydia 2013   Hx of trichomoniasis    Postpartum eclampsia 04/19/2015    Past Surgical History:  Procedure Laterality Date   NO PAST SURGERIES      There were no vitals filed for this visit.   Subjective Assessment - 05/25/21 1552     Subjective I have not been in therapy due to my brother commiting suicide. The pelvic pain is a little better. I felt good after the first treatment.    Patient Stated Goals reduce pain, how to manage pain    Currently in Pain? Yes    Pain Score 4     Pain Location Pelvis    Pain Orientation Mid    Pain Descriptors / Indicators Sore    Pain Type Acute pain    Pain Onset More than a month ago    Pain Frequency Intermittent    Aggravating Factors  walking, too much movement    Pain Relieving Factors sit down and rock pelvis    Multiple Pain Sites Yes    Pain Score 6    Pain Location Back    Pain Orientation Left    Pain Descriptors / Indicators Other (Comment)   clicking pain   Pain Type Chronic pain    Pain Radiating Towards sometimes has numbness in left leg    Pain Onset More than a month ago    Pain Frequency Constant    Aggravating Factors  bending over, squat to fix a table    Pain Relieving Factors  bend backwards                [facility] PT Assessment - 05/25/21 0001       Assessment   Medical Diagnosis O26.892; R10.2 Pelvic pain affecting pregnancy iin second trimester, antepartum    Referring Provider (PT) Nolene Bernheim, NP    Onset Date/Surgical Date 12/11/20    Prior Therapy none      Precautions   Precautions Other (comment)    Precaution Comments pregnant      Restrictions   Weight Bearing Restrictions No      Prior Function   Level of Independence Independent    Vocation Part time employment    Vocation Requirements customer service    Leisure walking      Cognition   Overall Cognitive Status Within Functional Limits for tasks assessed      Posture/Postural Control   Posture/Postural Control Postural limitations    Posture Comments pregnant posture      AROM   Overall AROM Comments lumbar ROM is full      PROM   Right Hip Internal Rotation  20  Left Hip Internal Rotation  25      Strength   Right Hip ABduction 4/5    Left Hip ABduction 4-/5      Palpation   SI assessment  ASIS are equal                           [facility] Adult PT Treatment/Exercise - 05/25/21 0001       Lumbar Exercises: Stretches   Active Hamstring Stretch Right;Left;1 rep;30 seconds    Active Hamstring Stretch Limitations sitting    Piriformis Stretch Right;Left;1 rep;30 seconds    Piriformis Stretch Limitations sitting      Lumbar Exercises: Supine   Bridge 15 reps      Lumbar Exercises: Quadruped   Madcat/Old Horse 10 reps    Madcat/Old Horse Limitations then move pelvis side to side      Manual Therapy   Manual Therapy Soft tissue mobilization;Joint mobilization;Taping    Joint Mobilization mobilization of T11-L5 in sidely    Soft tissue mobilization using the Addadayto bilateral buttocks and lumbar paraspinals in sidely; manual work to bilateral levator ani and coccygeus    Kinesiotex Facilitate Muscle      Kinesiotix   Facilitate Muscle  1  strip under abdomen and 2 strips up the abdomen                     PT Education - 05/25/21 1634     Education Details Access Code: [email or web address]    Person(s) Educated Patient    Methods Explanation;Demonstration;Verbal cues;Handout    Comprehension Returned demonstration;Verbalized understanding              PT Short Term Goals - 05/25/21 1643       PT SHORT TERM GOAL #1   Title independent with initial HEP    Time 3    Period Weeks    Status Achieved               PT Long Term Goals - 05/25/21 1644       PT LONG TERM GOAL #1   Title independent with  HEP    Baseline still learning exercises    Time 6    Period Weeks    Status On-going    Target Date 06/10/21      PT LONG TERM GOAL #2   Title pain with walking to work decreased </= 3/10 due to improved mobility    Baseline pain level 6/10    Time 6    Period Weeks    Status On-going      PT LONG TERM GOAL #3   Title pelvic pain with bending foward to open a drawer at work decreased </= 3/10 due to improve stability of lumbar sacral area    Baseline pain level 6/10    Time 6    Period Weeks    Status On-going      PT LONG TERM GOAL #4   Title understand differnet ways to give birth to reduce strain on back and pelvis with correct breath    Baseline not educated yet    Time 6    Period Weeks    Status New                   Plan - 05/25/21 1639     Clinical Impression Statement Patient was not able to come to her last 2 visits due to her brother  commiting suicide. Patient continues to have decreased movement of T11-L5 but after manual work she was able to move her spine without pain. Patient has tightness in the gluteus medius and posterior greater trochanter area. Patient pelvis in correct alignment today. Patient is not limping today. Patient reports no pain after treatment. She continues to have some weakness in the gluteal and lumbar muscles. Patient will benefit from skilled  therapy to to reduce her pain and improve mobility    Personal Factors and Comorbidities Comorbidity 1;Fitness;Profession    Comorbidities Presently pregnanat and due 08/13/21    Examination-Activity Limitations Continence;Locomotion Level;Stand    Stability/Clinical Decision Making Stable/Uncomplicated    Rehab Potential Excellent    PT Frequency 1x / week    PT Duration 6 weeks    PT Treatment/Interventions ADLs/Self Care Home Management;Moist Heat;Therapeutic activities;Therapeutic exercise;Neuromuscular re-education;Patient/family education;Manual techniques;Taping;Spinal Manipulations    PT Next Visit Plan body mechanics with work tasks;  along the lumbar paraspinals,  Ta contraction; trunk rotation; quadruped    PT Home Exercise Plan Access Code: [email or web address]    Recommended Other Services MD signed initial eval on 04/29/2021    Consulted and Agree with Plan of Care Patient             Patient will benefit from skilled therapeutic intervention in order to improve the following deficits and impairments:  Pain, Decreased activity tolerance, Decreased range of motion, Increased muscle spasms, Decreased strength  Visit Diagnosis: Pelvic pain  Muscle weakness (generalized)  Acute left-sided low back pain without sciatica     Problem List Patient Active Problem List   Diagnosis Date Noted   [redacted] weeks gestation of pregnancy 05/20/2021   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   Rubella non-immune status, antepartum 03/01/2021   Pap smear abnormality of cervix with LGSIL 02/27/2021   Supervision of low-risk pregnancy 12/28/2020   Trichomonal vaginitis during pregnancy in first trimester 12/06/2020   History of eclampsia 03/12/2020    Eulis Foster, PT 05/25/21 4:45 PM  [facility] [address], [address] [place], [place], [address] Phone: [phone]   Fax:  [phone]  Name: Veronica Jones MRN: [id number] Date of Birth:  01-Sep-1990

## 2021-05-26 ENCOUNTER — Encounter: Payer: Self-pay | Admitting: Obstetrics and Gynecology

## 2021-05-26 ENCOUNTER — Ambulatory Visit (INDEPENDENT_AMBULATORY_CARE_PROVIDER_SITE_OTHER): Payer: Self-pay | Admitting: Clinical

## 2021-05-26 ENCOUNTER — Telehealth: Payer: Self-pay | Admitting: General Practice

## 2021-05-26 DIAGNOSIS — F4321 Adjustment disorder with depressed mood: Secondary | ICD-10-CM

## 2021-05-26 NOTE — Patient Instructions (Signed)
[facility] [address] [place], [place] [address] [phone] (main office) [phone] (Myosha Cuadras's office)  [facility] (Individual and group grief support) [facility]  [phone]    

## 2021-05-26 NOTE — Telephone Encounter (Signed)
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 When she came back she noticed she felt lightheaded and could feel her heart beating fast. Patient states she checked her pulse on her watch because it alerted her and her heart rate was 156bpm. She sat down for 5 minutes and it went down to normal but then went up again when she started moving around. Patient states she had an appt with Asher Muir earlier as she lost her brother 2 weeks ago and this is her first day back at work. She states she doesn't know if its just nerves or anxiety. Patient reports she isn't eating or drinking much other than an ensure here and there. Told patient it sounds like a lot of things are contributing to her elevated heart rate such as nerves/anxiety/grief, slight dehydration, not really eating, increased heart rate in pregnancy in general, etc. Recommended she try to drink more water during the day, eating at least a small snack every few hours while at work, & when her heart rate elevates sitting and resting for at least 10-15 minutes. Patient verbalized understanding.

## 2021-06-01 ENCOUNTER — Encounter: Payer: Medicaid Other | Admitting: Physical Therapy

## 2021-06-03 ENCOUNTER — Telehealth: Payer: Self-pay | Admitting: Clinical

## 2021-06-03 ENCOUNTER — Telehealth (INDEPENDENT_AMBULATORY_CARE_PROVIDER_SITE_OTHER): Payer: Medicaid Other | Admitting: Obstetrics and Gynecology

## 2021-06-03 ENCOUNTER — Encounter: Payer: Self-pay | Admitting: Obstetrics and Gynecology

## 2021-06-03 VITALS — BP 141/90

## 2021-06-03 DIAGNOSIS — O0993 Supervision of high risk pregnancy, unspecified, third trimester: Secondary | ICD-10-CM

## 2021-06-03 DIAGNOSIS — Z3493 Encounter for supervision of normal pregnancy, unspecified, third trimester: Secondary | ICD-10-CM

## 2021-06-03 DIAGNOSIS — R898 Other abnormal findings in specimens from other organs, systems and tissues: Secondary | ICD-10-CM

## 2021-06-03 DIAGNOSIS — Z8759 Personal history of other complications of pregnancy, childbirth and the puerperium: Secondary | ICD-10-CM

## 2021-06-03 DIAGNOSIS — F419 Anxiety disorder, unspecified: Secondary | ICD-10-CM

## 2021-06-03 DIAGNOSIS — O99113 Other diseases of the blood and blood-forming organs and certain disorders involving the immune mechanism complicating pregnancy, third trimester: Secondary | ICD-10-CM

## 2021-06-03 DIAGNOSIS — O99343 Other mental disorders complicating pregnancy, third trimester: Secondary | ICD-10-CM

## 2021-06-03 DIAGNOSIS — Z3A29 29 weeks gestation of pregnancy: Secondary | ICD-10-CM | POA: Insufficient documentation

## 2021-06-03 MED ORDER — HYDROXYZINE PAMOATE 25 MG PO CAPS: 25.0000 mg | ORAL_CAPSULE | Freq: Three times a day (TID) | ORAL | 0 refills | Status: DC | PRN

## 2021-06-03 NOTE — Telephone Encounter (Signed)
Attempt to contact pt, at request of medical provider today; unable to leave voicemail as not set up.

## 2021-06-03 NOTE — Progress Notes (Signed)
OBSTETRICS PRENATAL VIRTUAL VISIT ENCOUNTER NOTE  Provider location: [facility] [place] [facility] [place]   Patient location: Home  I connected with Veronica Jones on 06/03/21 at  9:35 AM EST by MyChart Video Encounter and verified that I am speaking with the correct person using two identifiers. I discussed the limitations, risks, security and privacy concerns of performing an evaluation and management service virtually and the availability of in person appointments. I also discussed with the patient that there may be a patient responsible charge related to this service. The patient expressed understanding and agreed to proceed. Subjective:  Veronica Jones is a 30 y.o. G3P1011 at [redacted]w[redacted]d being seen today for ongoing prenatal care.  She is currently monitored for the following issues for this high-risk pregnancy and has History of eclampsia; Trichomonal vaginitis during pregnancy in first trimester; Supervision of low-risk pregnancy; Pap smear abnormality of cervix with LGSIL; Rubella non-immune status, antepartum; Pseudothrombocytopenia; [redacted] weeks gestation of pregnancy; [redacted] weeks gestation of pregnancy; and Anxiety on their problem list.  Patient reports  emotional distress secondary to brother's suicide .  Contractions: Not present.  .  Movement: Present. Denies any leaking of fluid.   The following portions of the patient's history were reviewed and updated as appropriate: allergies, current medications, past family history, past medical history, past social history, past surgical history and problem list.   Objective:   Vitals:   06/03/21 0951  BP: (!) 141/90    Fetal Status:     Movement: Present     General:  Alert, oriented and cooperative. Patient is in no acute distress.  Respiratory: Normal respiratory effort, no problems with respiration noted  Mental Status: Normal mood and affect. Normal behavior. Normal judgment and thought content.  Rest of physical exam deferred  due to type of encounter  Imaging: [place] MFM OB FOLLOW UP  Result Date: 05/24/2021 ----------------------------------------------------------------------  OBSTETRICS REPORT                       (Signed Final 05/24/2021 01:08 pm) ---------------------------------------------------------------------- Patient Info  ID #:       [id number]                          D.O.B.:  [birth year] (30 yrs)  Name:       Veronica Jones                    Visit Date: 05/24/2021 12:25 pm ---------------------------------------------------------------------- Performed By  Attending:        Tama High MD        Secondary Phy.:   [facility]                                                             [facility]  Performed By:     Rodrigo Ran BS      Address:          [address]                    RDMS [address]T  [place], [place]  Referred By:      Carolann Littler             Location:         [facility]                    Rayshad Riviello MD                                  F[facility] at                                                             [place] [facility]                                                             [facility]  Ref. Address:     Center for                    [place] ---------------------------------------------------------------------- Orders  #  Description                           Code        Ordered By  1  [place] MFM OB FOLLOW UP                   [id number]    Tama High ----------------------------------------------------------------------  #  Order #                     Accession #                Episode #  1  [id number]                   [id number]                 [id number] ---------------------------------------------------------------------- Indications  [redacted] weeks gestation of pregnancy                [id number]  Drug use complicating pregnancy, 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 Cord Insertion:      Visualized  Amniotic Fluid  AFI FV:      Within normal limits  AFI Sum(cm)     %Tile       Largest Pocket(cm)  18.7            72          6.4  RUQ(cm)       RLQ(cm)       LUQ(cm)        LLQ(cm)  4             6.4           3.4            4.9 ---------------------------------------------------------------------- Biometry  BPD:      76.4  mm     G. Age:  30w 5d         94  %    CI:        74.62   %    70 - 86                                                          FL/HC:      19.0   %    18.8 - 20.6  HC:      280.7  mm     G. Age:  30w 5d         85  %    HC/AC:      1.17        1.05 - 1.21  AC:      240.2  mm     G. Age:  28w 2d         39  %    FL/BPD:     69.6   %    71 - 87  FL:       53.2  mm     G. Age:  28w 2d         30  %    FL/AC:      22.1   %    20 - 24  LV:        2.4  mm  Est. FW:    1265  gm    2 lb 13 oz      46  % ---------------------------------------------------------------------- OB History  Gravidity:    3         Term:   1        Prem:   0        SAB:   0  TOP:          0       Ectopic:  1        Living: 1 ---------------------------------------------------------------------- Gestational Age  LMP:           28w 3d        Date:  11/06/20                 EDD:   08/13/21  U/S Today:     29w 4d  EDD:   08/05/21  Best:          [facility] 3d     Det. By:  LMP  (11/06/20)          EDD:   08/13/21  ---------------------------------------------------------------------- Anatomy  Cranium:               Appears normal         LVOT:                   Previously seen  Cavum:                 Appears normal         Aortic Arch:            Previously seen  Ventricles:            Appears normal         Ductal Arch:            Previously seen  Choroid Plexus:        Previously seen        Diaphragm:              Appears normal  Cerebellum:            Appears normal         Stomach:                Appears normal, left                                                                        sided  Posterior Fossa:       Appears normal         Abdomen:                Appears normal  Nuchal Fold:           Previously seen        Abdominal Wall:         Previously seen  Face:                  Appears normal         Cord Vessels:           Previously seen                         (orbits and profile)  Lips:                  Previously seen        Kidneys:                Appear normal  Palate:                Previously seen        Bladder:                Appears normal  Thoracic:              Appears normal         Spine:                  Previously seen  Heart:  Appears normal         Upper Extremities:      Previously seen                         (4CH, axis, and                         situs)  RVOT:                  Previously seen        Lower Extremities:      Previously seen  Other:  Fetus appears to be a female. 3VV, nasal bone, lenses, heels/feet and          open hands/5th digits previously visualized. Technically difficult due          to fetal position. ---------------------------------------------------------------------- Cervix Uterus Adnexa  Cervix  Not visualized (advanced GA >24wks)  Uterus  No abnormality visualized.  Right Ovary  Not visualized.  Left Ovary  Not visualized.  Cul De Sac  No free fluid seen.  Adnexa  No abnormality visualized.  ---------------------------------------------------------------------- Impression  Fetal growth is appropriate for gestational age .Amniotic fluid  is normal and good fetal activity is seen .  She does not have gestational diabetes . ---------------------------------------------------------------------- Recommendations  -An appointment was made for her to return in 4 weeks for  fetal growth assessment. ----------------------------------------------------------------------                  Tama High, MD Electronically Signed Final Report   05/24/2021 01:08 pm ----------------------------------------------------------------------   Assessment and Plan:  Pregnancy: G3P1011 at [redacted]w[redacted]d 1. Encounter for supervision of low-risk pregnancy in third trimester Continue routine care Next growth scan 07/05/21  2. [redacted] weeks gestation of pregnancy   3. History of eclampsia Blood pressure slightly elevated, pt attributes it to stress and the loss of her brother.  Pt has mild HA, but no RUQ pain or visual changes.  Pt advised to recheck in 45 min-1 hour, if still elevated advised to go to MAU for evaluation  4. Pseudothrombocytopenia Per hematology, follow up phone visit 7 weeks from last exam, that would be early January.  Recommend monthly cbc for platelets  5. Anxiety Recommend continued follow ups with behavorial health.  Advised unisom to help with sleep.  Vistaril to help with anxiety and agitation.  - hydrOXYzine (VISTARIL) 25 MG capsule; Take 1 capsule (25 mg total) by mouth every 8 (eight) hours as needed for anxiety.  Dispense: 30 capsule; Refill: 0  Preterm labor symptoms and general obstetric precautions including but not limited to vaginal bleeding, contractions, leaking of fluid and fetal movement were reviewed in detail with the patient. I discussed the assessment and treatment plan with the patient. The patient was provided an opportunity to ask questions and all were answered. The patient  agreed with the plan and demonstrated an understanding of the instructions. The patient was advised to call back or seek an in-person office evaluation/go to [facility]U at [facility] for any urgent or concerning symptoms. Please refer to After Visit Summary for other counseling recommendations.   I provided 10 minutes of face-to-face time during this encounter.  Return in about 2 weeks (around 06/17/2021) for ROB, in person.  Future Appointments  Date Time Provider [place]  06/08/2021  4:00 PM Monico Hoar, [place] [facility]  06/21/2021  3:00 PM [facility] LAB [facility] None  06/22/2021  3:20 PM Brunetta Genera, [place] [facility] None  07/05/2021 12:30 PM [facility] NURSE [facility]  07/05/2021 12:45 PM [facility] US4 [facility] [place]    Griffin Basil, MD Center for [employer], [place]

## 2021-06-03 NOTE — Progress Notes (Signed)
I connected with  Veronica Jones on 06/03/21 at  9:35 AM EST by Mychart Virtual Video Visit and verified that I am speaking with the correct person using two identifiers.   I discussed the limitations, risks, security and privacy concerns of performing an evaluation and management service by telephone and the availability of in person appointments. I also discussed with the patient that there may be a patient responsible charge related to this service. The patient expressed understanding and agreed to proceed.  Guy Begin, CMA 06/03/2021  9:43 AM

## 2021-06-04 ENCOUNTER — Telehealth: Payer: Self-pay | Admitting: Clinical

## 2021-06-04 NOTE — Telephone Encounter (Signed)
Brief mood check, as requested by medical provider, during this difficult time (loss of brother to suicide); pt agrees to follow up virtual visit on 06/25/20; encouraged to call Asher Muir at [phone], as needed.

## 2021-06-08 ENCOUNTER — Telehealth: Payer: Self-pay | Admitting: Physical Therapy

## 2021-06-08 ENCOUNTER — Encounter: Payer: Self-pay | Admitting: Physical Therapy

## 2021-06-08 NOTE — Telephone Encounter (Signed)
Called patient about her missed appointment today at 16:00. She though it was tomorrow. Made another visit for this Thursday. Explained to her if she does not come to this appointment she will have to be discharged due to our attendance policy.  Eulis Foster, PT 06/08/2021@ 4:26 PM

## 2021-06-11 ENCOUNTER — Telehealth: Payer: Self-pay

## 2021-06-11 NOTE — Telephone Encounter (Signed)
Pt call transferred from the front office.  Pt reports that she is currently at work is having episodes of blurry vision and dizziness. Pt states that she usually eats in the morning and it helps with the dizziness.  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  I advised pt that hydration and eating is very important to prevent those symptoms.  I encouraged pt to please go to MAU for evaluation as our office is closing soon and she should also get her BP checked with her having a history of eclampsia.  Pt verbalized understanding with no further questions.   Addison Naegeli, RN  06/11/21

## 2021-06-11 NOTE — BH Specialist Note (Signed)
Integrated Behavioral Health via Telemedicine Visit  06/11/2021 Veronica Jones [id number]  Number of Integrated Behavioral Health visits: 3 Session Start time: 11:13  Session End time: 11:32 Total time:  76  Referring Provider: Mariel Aloe, MD Patient/Family location: Home [facility] Provider location: [facility]  All persons participating in visit: [name] and Veronica Jones   Types of Service: Individual psychotherapy and Video visit  I connected with Veronica Jones and/or Veronica Jones's  n/a  via  Telephone or Video Enabled Telemedicine Application  (Video is Caregility application) and verified that I am speaking with the correct person using two identifiers. Discussed confidentiality: Yes   I discussed the limitations of telemedicine and the availability of in person appointments.  Discussed there is a possibility of technology failure and discussed alternative modes of communication if that failure occurs.  I discussed that engaging in this telemedicine visit, they consent to the provision of behavioral healthcare and the services will be billed under their insurance.  Patient and/or legal guardian expressed understanding and consented to Telemedicine visit: Yes   Presenting Concerns: Patient and/or family reports the following symptoms/concerns: Starting back drawing to help cope with feelings of grief (used to draw with brother); hopeful to move into own apartment soon and looking forward to baby's arrival, in the midst of grieving brother.  Duration of problem: Current pregnancy; Severity of problem: moderate  Patient and/or Family's Strengths/Protective Factors: Social connections, Concrete supports in place (healthy food, safe environments, etc.), Sense of purpose, and Physical Health (exercise, healthy diet, medication compliance, etc.)  Goals Addressed: Patient will:  Demonstrate ability to: Increase  healthy adjustment to current life circumstances  Progress towards Goals: Ongoing  Interventions: Interventions utilized:  Supportive Reflection Standardized Assessments completed: Not Needed  Patient and/or Family Response: Pt agrees with treatment plan  Assessment: Patient currently experiencing Grief.   Patient may benefit from continued therapeutic interventions.  Plan: Follow up with behavioral health clinician on : Three weeks Behavioral recommendations:  -Continue allowing time and space for grief as it comes -Continue plan to paint with daughter this afternoon; continue setting aside time for drawing daily -Read through Postpartum Planner on After Visit Summary Referral(s): Integrated [employer] (In Clinic)  I discussed the assessment and treatment plan with the patient and/or parent/guardian. They were provided an opportunity to ask questions and all were answered. They agreed with the plan and demonstrated an understanding of the instructions.   They were advised to call back or seek an in-person evaluation if the symptoms worsen or if the condition fails to improve as anticipated.  Veronica Close [place] Trawick, LCSW

## 2021-06-13 NOTE — L&D Delivery Note (Addendum)
OB/GYN Faculty Practice Delivery Note  Veronica Jones is a 31 y.o. G3P1011 s/p SVD at [redacted]w[redacted]d. She was admitted for active labor with expectant management.   ROM: 3h 44m with clear brown fluid GBS Status: Negative Maximum Maternal Temperature: 97.9  Labor Progress: AROM @ 12 PM Epidural placed 3:38 PM  Delivery Date/Time: 1556 Delivery: Called to room and patient was complete and pushing. Head delivered LOA. No nuchal cord present. Shoulder and body delivered in usual fashion. Infant with spontaneous cry, placed on mother's abdomen, dried and stimulated. Cord clamped x 2 after 1-minute delay, and cut by father of baby under my direct supervision. Cord blood drawn. Placenta delivered spontaneously with gentle cord traction. Fundus firm with massage and Pitocin. Labia, perineum, vagina, and cervix were inspected, with some non bleeding superficial abrasions on R labia and periclitoral.   Placenta: complete, three vessel cord appreciated Complications: None Lacerations: superficial abrasions on R labia and periclitoral hemostatic EBL: 112 mL Analgesia: Epidural  Postpartum Planning [ ]  message to sent to schedule follow-up  [ ]  vaccines UTD  Infant: female   APGARs 9,9   weight pending  , MD [facility] PGY-1 [facility] for [facility], [facility]   Midwife attestation: I was gloved and present for delivery in its entirety and I agree with the above resident's note.  [facility], CNM 5:22 PM

## 2021-06-18 ENCOUNTER — Other Ambulatory Visit: Payer: Self-pay

## 2021-06-18 DIAGNOSIS — D696 Thrombocytopenia, unspecified: Secondary | ICD-10-CM

## 2021-06-21 ENCOUNTER — Inpatient Hospital Stay: Payer: Medicaid Other | Attending: Hematology

## 2021-06-21 ENCOUNTER — Other Ambulatory Visit: Payer: Self-pay

## 2021-06-21 DIAGNOSIS — D696 Thrombocytopenia, unspecified: Secondary | ICD-10-CM | POA: Diagnosis present

## 2021-06-21 DIAGNOSIS — O99113 Other diseases of the blood and blood-forming organs and certain disorders involving the immune mechanism complicating pregnancy, third trimester: Secondary | ICD-10-CM | POA: Insufficient documentation

## 2021-06-21 DIAGNOSIS — Z87891 Personal history of nicotine dependence: Secondary | ICD-10-CM | POA: Diagnosis not present

## 2021-06-21 LAB — CBC WITH DIFFERENTIAL (CANCER CENTER ONLY)
Abs Immature Granulocytes: 0.11 10*3/uL — ABNORMAL HIGH (ref 0.00–0.07)
Basophils Absolute: 0 10*3/uL (ref 0.0–0.1)
Basophils Relative: 0 %
Eosinophils Absolute: 0.1 10*3/uL (ref 0.0–0.5)
Eosinophils Relative: 1 %
HCT: 34 % — ABNORMAL LOW (ref 36.0–46.0)
Hemoglobin: 11.3 g/dL — ABNORMAL LOW (ref 12.0–15.0)
Immature Granulocytes: 1 %
Lymphocytes Relative: 22 %
Lymphs Abs: 2.3 10*3/uL (ref 0.7–4.0)
MCH: 30.5 pg (ref 26.0–34.0)
MCHC: 33.2 g/dL (ref 30.0–36.0)
MCV: 91.6 fL (ref 80.0–100.0)
Monocytes Absolute: 0.9 10*3/uL (ref 0.1–1.0)
Monocytes Relative: 9 %
Neutro Abs: 6.9 10*3/uL (ref 1.7–7.7)
Neutrophils Relative %: 67 %
Platelet Count: 116 10*3/uL — ABNORMAL LOW (ref 150–400)
RBC: 3.71 MIL/uL — ABNORMAL LOW (ref 3.87–5.11)
RDW: 13.7 % (ref 11.5–15.5)
WBC Count: 10.4 10*3/uL (ref 4.0–10.5)
nRBC: 0 % (ref 0.0–0.2)

## 2021-06-21 LAB — IRON AND IRON BINDING CAPACITY (CC-WL,HP ONLY)
Iron: 73 ug/dL (ref 28–170)
Saturation Ratios: 12 % (ref 10.4–31.8)
TIBC: 591 ug/dL — ABNORMAL HIGH (ref 250–450)
UIBC: 518 ug/dL — ABNORMAL HIGH (ref 148–442)

## 2021-06-21 LAB — CMP (CANCER CENTER ONLY)
ALT: 12 U/L (ref 0–44)
AST: 12 U/L — ABNORMAL LOW (ref 15–41)
Albumin: 3.5 g/dL (ref 3.5–5.0)
Alkaline Phosphatase: 90 U/L (ref 38–126)
Anion gap: 8 (ref 5–15)
BUN: 8 mg/dL (ref 6–20)
CO2: 21 mmol/L — ABNORMAL LOW (ref 22–32)
Calcium: 8.8 mg/dL — ABNORMAL LOW (ref 8.9–10.3)
Chloride: 106 mmol/L (ref 98–111)
Creatinine: 0.61 mg/dL (ref 0.44–1.00)
GFR, Estimated: >60 mL/min (ref >60)
Glucose, Bld: 96 mg/dL (ref 70–99)
Potassium: 4 mmol/L (ref 3.5–5.1)
Sodium: 135 mmol/L (ref 135–145)
Total Bilirubin: 0.3 mg/dL (ref 0.3–1.2)
Total Protein: 6.7 g/dL (ref 6.5–8.1)

## 2021-06-21 LAB — IMMATURE PLATELET FRACTION: Immature Platelet Fraction: 22.7 % — ABNORMAL HIGH (ref 1.2–8.6)

## 2021-06-22 ENCOUNTER — Inpatient Hospital Stay (HOSPITAL_BASED_OUTPATIENT_CLINIC_OR_DEPARTMENT_OTHER): Payer: Medicaid Other | Admitting: Hematology

## 2021-06-22 DIAGNOSIS — D696 Thrombocytopenia, unspecified: Secondary | ICD-10-CM

## 2021-06-22 DIAGNOSIS — O99113 Other diseases of the blood and blood-forming organs and certain disorders involving the immune mechanism complicating pregnancy, third trimester: Secondary | ICD-10-CM | POA: Diagnosis not present

## 2021-06-22 LAB — FOLATE RBC
Folate, Hemolysate: 418 ng/mL
Folate, RBC: 1222 ng/mL (ref >498)
Hematocrit: 34.2 % (ref 34.0–46.6)

## 2021-06-22 LAB — FERRITIN: Ferritin: 5 ng/mL — ABNORMAL LOW (ref 11–307)

## 2021-06-22 MED ORDER — POLYSACCHARIDE IRON COMPLEX 150 MG PO CAPS: 150.0000 mg | ORAL_CAPSULE | Freq: Every day | ORAL | 2 refills | Status: DC

## 2021-06-22 MED ORDER — VITAMIN B-12 1000 MCG PO TABS: 1000.0000 ug | ORAL_TABLET | Freq: Every day | ORAL | 3 refills | Status: DC

## 2021-06-25 ENCOUNTER — Ambulatory Visit (INDEPENDENT_AMBULATORY_CARE_PROVIDER_SITE_OTHER): Payer: Medicaid Other | Admitting: Clinical

## 2021-06-25 DIAGNOSIS — F4321 Adjustment disorder with depressed mood: Secondary | ICD-10-CM | POA: Diagnosis not present

## 2021-06-25 NOTE — Patient Instructions (Signed)
[facility] [address] [place], [place] [address] [phone] (main office) [phone] (Kaylin Schellenberg's office)     BRAINSTORMING  Develop a Plan Goals: Provide a way to start conversation about your new life with a baby Assist parents in recognizing and using resources within their reach Help pave the way before birth for an easier period of transition afterwards.  Make a list of the following information to keep in a central location: Full name of Mom and Partner: _____________________________________________ Baby's full name and Date of Birth: ___________________________________________ Home Address: ___________________________________________________________ ________________________________________________________________________ Home Phone: ____________________________________________________________ Parents' cell numbers: _____________________________________________________ ________________________________________________________________________ Name and contact info for OB: ______________________________________________ Name and contact info for Pediatrician:________________________________________ Contact info for Lactation Consultants: ________________________________________  REST and SLEEP *You each need at least 4-5 hours of uninterrupted sleep every day. Write specific names and contact information.* How are you going to rest in the postpartum period? While partner's home? When partner returns to work? When you both return to work? Where will your baby sleep? Who is available to help during the day? Evening? Night? Who could move in for a period to help support you? What are some ideas to help you get enough  sleep? __________________________________________________________________________________________________________________________________________________________________________________________________________________________________________ NUTRITIOUS FOOD AND DRINK *Plan for meals before your baby is born so you can have healthy food to eat during the immediate postpartum period.* Who will look after breakfast? Lunch? Dinner? List names and contact information. Brainstorm quick, healthy ideas for each meal. What can you do before baby is born to prepare meals for the postpartum period? How can others help you with meals? Which grocery stores provide online shopping and delivery? Which restaurants offer take-out or delivery options? ______________________________________________________________________________________________________________________________________________________________________________________________________________________________________________________________________________________________________________________________________________________________________________________________________  CARE FOR MOM *It's important that mom is cared for and pampered in the postpartum period. Remember, the most important ways new mothers need care are: sleep, nutrition, gentle exercise, and time off.* Who can come take care of mom during this period? Make a list of people with their contact information. List some activities that make you feel cared for, rested, and energized? Who can make sure you have opportunities to do these things? Does mom have a space of her very own within your home that's just for her? Make a "Mama Cave" where she can be comfortable, rest, and renew herself  daily. ______________________________________________________________________________________________________________________________________________________________________________________________________________________________________________________________________________________________________________________________________________________________________________________________________    CARE FOR AND FEEDING BABY *Knowledgeable and encouraging people will offer the best support with regard to feeding your baby.* Educate yourself and choose the best feeding option for your baby. Make a list of people who will guide, support, and be a resource for you as your care for and feed your baby. (Friends that have breastfed or are currently breastfeeding, lactation consultants, breastfeeding support groups, etc.) Consider a postpartum doula. (These websites can give you information: dona.org & padanc.org) Seek out local breastfeeding resources like the breastfeeding support group at [facility] or [employer]. ______________________________________________________________________________________________________________________________________________________________________________________________________________________________________________________________________________________________________________________________________________________________________________________________________  CHORES AND ERRANDS Who can help with a thorough cleaning before baby is born? Make a list of people who will help with housekeeping and chores, like laundry, light cleaning, dishes, bathrooms, etc. Who can run some errands for you? What can you do to make sure you are stocked with basic supplies before baby is born? Who is going to do the  shopping? ______________________________________________________________________________________________________________________________________________________________________________________________________________________________________________________________________________________________________________________________________________________________________________________________________     Family Adjustment *Nurture yourselves.it helps parents be more loving and allows for better bonding with their child.* What sorts of things do you and partner enjoy   doing together? Which activities help you to connect and strengthen your relationship? Make a list of those things. Make a list of people whom you trust to care for your baby so you can have some time together as a couple. What types of things help partner feel connected to Mom? Make a list. What needs will partner have in order to bond with baby? Other children? Who will care for them when you go into labor and while you are in the hospital? Think about what the needs of your older children might be. Who can help you meet those needs? In what ways are you helping them prepare for bringing baby home? List some specific strategies you have for family adjustment. _______________________________________________________________________________________________________________________________________________________________________________________________________________________________________________________________________________________________________________________________________________  SUPPORT *Someone who can empathize with experiences normalizes your problems and makes them more bearable.* Make a list of other friends, neighbors, and/or co-workers you know with infants (and small children, if applicable) with whom you can connect. Make a list of local or online support groups, mom groups, etc. in which you can be  involved. ______________________________________________________________________________________________________________________________________________________________________________________________________________________________________________________________________________________________________________________________________________________________________________________________________  Childcare Plans Investigate and plan for childcare if mom is returning to work. Talk about mom's concerns about her transition back to work. Talk about partner's concerns regarding this transition.  Mental Health *Your mental health is one of the highest priorities for a pregnant or postpartum mom.* 1 in 5 women experience anxiety and/or depression from the time of conception through the first year after birth. Postpartum Mood Disorders are the #1 complication of pregnancy and childbirth and the suffering experienced by these mothers is not necessary! These illnesses are temporary and respond well to treatment, which often includes self-care, social support, talk therapy, and medication when needed. Women experiencing anxiety and depression often say things like: I'm supposed to be happywhy do I feel so sad?, Why can't I snap out of it?, I'm having thoughts that scare me. There is no need to be embarrassed if you are feeling these symptoms: Overwhelmed, anxious, angry, sad, guilty, irritable, hopeless, exhausted but can't sleep You are NOT alone. You are NOT to blame. With help, you WILL be well. Where can I find help? Medical professionals such as your OB, midwife, gynecologist, family practitioner, primary care provider, pediatrician, or mental health providers; [facility] support groups: Feelings After Birth, Breastfeeding Support Group, Baby and Me Group, and Fit 4 Two exercise classes. You have permission to ask for help. It will confirm your feelings, validate your experiences,  share/learn coping strategies, and gain support and encouragement as you heal. You are important! BRAINSTORM Make a list of local resources, including resources for mom and for partner. Identify support groups. Identify people to call late at night - include names and contact info. Talk with partner about perinatal mood and anxiety disorders. Talk with your OB, midwife, and doula about baby blues and about perinatal mood and anxiety disorders. Talk with your pediatrician about perinatal mood and anxiety disorders.   Support & Sanity Savers   What do you really need?  Basics In preparing for a new baby, many expectant parents spend hours shopping for baby clothes, decorating the nursery, and deciding which car seat to buy. Yet most don't think much about what the reality of parenting a newborn will be like, and what they need to make it through that. So, here is the advice of experienced parents. We know you'll read this, and think they're exaggerating, I don't really need that. Just trust [place] on these, OK? Plan for all of  this, and if it turns out you don't need it, come back and teach us how you did it!  Must-Haves (Once baby's survival needs are met, make sure you attend to your own survival needs!) Sleep An average newborn sleeps 16-18 hours per day, over 6-7 sleep periods, rarely more than three hours at a time. It is normal and healthy for a newborn to wake throughout the night... but really hard on parents!! Naps. Prioritize sleep above any responsibilities like: cleaning house, visiting friends, running errands, etc.  Sleep whenever baby sleeps. If you can't nap, at least have restful times when baby eats. The more rest you get, the more patient you will be, the more emotionally stable, and better at solving problems.  Food You may not have realized it would be difficult to eat when you have a newborn. Yet, when we talk to countless new parents, they say things like "it may be 2:00 pm  when I realize I haven't had breakfast yet." Or "every time we sit down to dinner, baby needs to eat, and my food gets cold, so I don't bother to eat it." Finger food. Before your baby is born, stock up with one months' worth of food that: 1) you can eat with one hand while holding a baby, 2) doesn't need to be prepped, 3) is good hot or cold, 4) doesn't spoil when left out for a few hours, and 5) you like to eat. Think about: nuts, dried fruit, Clif bars, pretzels, jerky, gogurt, baby carrots, apples, bananas, crackers, cheez-n-crackers, string cheese, hot pockets or frozen burritos to microwave, garden burgers and breakfast pastries to put in the toaster, yogurt drinks, etc. Restaurant Menus. Make lists of your favorite restaurants & menu items. When family/friends want to help, you can give specific information without much thought. They can either bring you the food or send gift cards for just the right meals. Freezer Meals.  Take some time to make a few meals to put in the freezer ahead of time.  Easy to freeze meals can be anything such as soup, lasagna, chicken pie, or spaghetti sauce. Set up a Meal Schedule.  Ask friends and family to sign up to bring you meals during the first few weeks of being home. (It can be passed around at baby showers!) You have no idea how helpful this will be until you are in the throes of parenting.  www.takethemameal.com is a great website to check out. Emotional Support Know who to call when you're stressed out. Parenting a newborn is very challenging work. There are times when it totally overwhelms your normal coping abilities. EVERY NEW PARENT NEEDS TO HAVE A PLAN FOR WHO TO CALL WHEN THEY JUST CAN'T COPE ANY MORE. (And it has to be someone other than the baby's other parent!) Before your baby is born, come up with at least one person you can call for support - write their phone number down and post it on the refrigerator. Anxiety & Sadness. Baby blues are normal after  pregnancy; however, there are more severe types of anxiety & sadness which can occur and should not be ignored.  They are always treatable, but you have to take the first step by reaching out for help. [facility] offers a "Mom Talk" group which meets every Tuesday from 10 am - 11 am.  This group is for new moms who need support and connection after their babies are born.  Call [phone].  Really, Really Helpful (Plan for them!   Make sure these happen often!!) Physical Support with Taking Care of Yourselves Asking friends and family. Before your baby is born, set up a schedule of people who can come and visit and help out (or ask a friend to schedule for you). Any time someone says let me know what I can do to help, sign them up for a day. When they get there, their job is not to take care of the baby (that's your job and your joy). Their job is to take care of you!  Postpartum doulas. If you don't have anyone you can call on for support, look into postpartum doulas:  professionals at helping parents with caring for baby, caring for themselves, getting breastfeeding started, and helping with household tasks. www.padanc.org is a helpful website for learning about doulas in our area. Peer Support / Parent Groups Why: One of the greatest ideas for new parents is to be around other new parents. Parent groups give you a chance to share and listen to others who are going through the same season of life, get a sense of what is normal infant development by watching several babies learn and grow, share your stories of triumph and struggles with empathetic ears, and forgive your own mistakes when you realize all parents are learning by trial and error. Where to find: There are many places you can meet other new parents throughout our community.  [facility] offers the following classes for new moms and their little ones:  Baby and Me (Birth to [place]) and Breastfeeding Support Group. Go to  www.conehealthybaby.com or call ([phone] for more information. Time for your Relationship It's easy to get so caught up in meeting baby's immediate needs that it's hard to find time to connect with your partner, and meet the needs of your relationship. It's also easy to forget what quality time with your partner actually looks like. If you take your baby on a date, you'd be amazed how much of your couple time is spent feeding the baby, diapering the baby, admiring the baby, and talking about the baby. Dating: Try to take time for just the two of you. Babysitter tip: Sometimes when moms are breastfeeding a newborn, they find it hard to figure out how to schedule outings around baby's unpredictable feeding schedules. Have the babysitter come for a three hour period. When she comes over, if baby has just eaten, you can leave right away, and come back in two hours. If baby hasn't fed recently, you start the date at home. Once baby gets hungry and gets a good feeding in, you can head out for the rest of your date time. Date Nights at Home: If you can't get out, at least set aside one evening a week to prioritize your relationship: whenever baby dozes off or doesn't have any immediate needs, spend a little time focusing on each other. Potential conflicts: The main relationship conflicts that come up for new parents are: issues related to sexuality, financial stresses, a feeling of an unfair division of household tasks, and conflicts in parenting styles. The more you can work on these issues before baby arrives, the better!  Fun and Frills (Don't forget these and don't feel guilty for indulging in them!) Everyone has something in life that is a fun little treat that they do just for themselves. It may be: reading the morning paper, or going for a daily jog, or having coffee with a friend once a week, or going to a movie on Friday nights,  or fine chocolates, or bubble baths, or curling up with a good  book. Unless you do fun things for yourself every now and then, it's hard to have the energy for fun with your baby. Whatever your "special" treats are, make sure you find a way to continue to indulge in them after your baby is born. These special moments can recharge you, and allow you to return to baby with a new joy   PERINATAL MOOD DISORDERS: MATERNAL MENTAL HEALTH FROM CONCEPTION THROUGH THE POSTPARTUM PERIOD   _________________________________________Emergency and Crisis Resources If you are an imminent risk to self or others, are experiencing intense personal distress, and/or have noticed significant changes in activities of daily living, call:  911 [facility]: [phone]  [address], [place], [place], [address] Mobile Crisis: 877-626-1772 National Suicide Hotline: 988 Or visit the following crisis centers: Local Emergency Departments [facility]: [address], [place]  [phone]. Hours: 8:30AM-5PM. Insurance Accepted: Medicaid, Medicare, and Uninsured.  [facility]:  [address], [place]  Mon-Friday 8am-3pm, [phone]                                                                                  ___________ Non-Crisis Resources To identify specific providers that are covered by your insurance, contact your insurance company or local agencies:  [facility]: 1-800-256-2452 [facility]: 888-581-9988 [employer]: 1-800-939-5911 Postpartum Support International- Warm-line: 1-800-944-4773                                                      __Outpatient Therapy and Medication Management   Providers:  [facility]: [phone] Hours: 9AM-5PM  Insurance Accepted: AARP, Aetna, BCBS, Cigna, [employer], Humana, Medicare  [facility] [phone] Hours: 8AM-5:30PM  nsurance Accepted: All insurances EXCEPT AARP, Aetna,  [employer], and Humana [facility]: [phone] Hours: 8AM-8PM Insurance Accepted: Aetna, BCBS, Cigna, [employer], Medicaid, Medicare, Uninsured [facility]: [phone] [facility]: [phone] Hours: 8:30AM-7PM Insurance Accepted: Aetna, BCBS, Medicaid, Medicare, Tricare, United Healthcare [facility]:  [phone]   Hours:9AM-5PM Insurance Accepted:  Aetna, BCBS, [facility], Medicaid, United Health Care  [facility]: [phone] Hours: 9AM-5:30PM Insurance Accepted: AARP, Aetna, BCBS, Cigna, and Medicaid, Medicare, United Health Care [facility]:  [phone] Hours: 9am-5pm Insurance Accepted: BCBS; they do not accept Medicaid/Medicare [facility]: [phone] Hours: 9am-9pm Insurance Accepted: All major insurance including Medicaid and Medicare [facility]: [phone] Hours: 9AM- 5PM Insurance Accepted: All insurances EXCEPT Medicaid and Medicare. [facility]: [phone]   ____________                                                                       Parenting Support Groups [facility]: [phone] [facility]:  [phone] Family Support Network: (support for children in the NICU and/or with special needs), [phone]   ___________                                                                 Mental Health Support Groups Mental Health Association: [phone]    _____________                                                                                  Online Resources Postpartum Support International: http://www.postpartum.net/  800-944-4PPD 2Moms Supporting Moms:  www.momssupportingmoms.net    

## 2021-06-28 NOTE — Progress Notes (Addendum)
Veronica Jones   HEMATOLOGY/ONCOLOGY PHONE VISIT NOTE  Date of Service: 06/30/2021  Patient Care Team: Patient, No Pcp Per (Inactive) as PCP - General (General Practice)  CHIEF COMPLAINTS/PURPOSE OF CONSULTATION:  Follow-up for thrombocytopenia in pregnancy  HISTORY OF PRESENTING ILLNESS:   Veronica Jones is a wonderful 31 y.o. female who has been referred to [place] by Dr .Lynnda Shields MD for evaluation and management of thrombocytopenia in pregnancy.  Patient is currently about [redacted] weeks pregnant.  She is G3 P1 A1  Notes having some mild thrombocytopenia with her previous pregnancy but this was not an issue. Denies any issues with bleeding or abnormal bruising. No recent infections fevers chills night sweats.  Denies any over-the-counter medications other than prenatal vitamins. No family history of blood disorders.  Labs done early in pregnancy on 01/28/2021 with her OB/GYN doctor Showed CBC with hemoglobin of 12.9 MCV of 89 WBC count of 5.3k platelets of 119k with some aggregation of platelets noted in the sample.  Patient denies any previous history of ITP diagnosis or treatment.   INTERVAL HISTORY .I connected with Beverly Gust on 06/30/21 at  3:20 PM EST by telephone visit and verified that I am speaking with the correct person using two identifiers.   I discussed the limitations, risks, security and privacy concerns of performing an evaluation and management service by telemedicine and the availability of in-person appointments. I also discussed with the patient that there may be a patient responsible charge related to this service. The patient expressed understanding and agreed to proceed.   Other persons participating in the visit and their role in the encounter: none   Patients location: Home  Providers location: [place]  Chief Complaint: Follow-up for review of interval labs to monitor thrombocytopenia.  Patient notes she is nearly at 33 weeks of pregnancy at this  time.  No acute new symptoms.  No issues with bleeding or abnormal bruising.  Mild fatigue but no other acute new concerns. Labs done 06/21/2021 CBC shows stable/improved platelet counts of 116k, hemoglobin of 11.3 normal WBC count of 10.4k CMP unremarkable RBC folate within normal limits Ferritin 5 with an iron saturation of 12%    MEDICAL HISTORY:  Past Medical History:  Diagnosis Date   Chlamydia 2013   Hx of trichomoniasis    Postpartum eclampsia 04/19/2015    SURGICAL HISTORY: Past Surgical History:  Procedure Laterality Date   NO PAST SURGERIES      SOCIAL HISTORY: Social History   Socioeconomic History   Marital status: Single    Spouse name: Not on file   Number of children: 0   Years of education: Not on file   Highest education level: Not on file  Occupational History   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  Tobacco Use   Smoking status: Former    Packs/day: 0.50    Years: 1.00    Pack years: 0.50    Types: Cigarettes    Quit date: 10/11/2020    Years since quitting: 0.7   Smokeless tobacco: Never  Vaping Use   Vaping Use: Never used  Substance and Sexual Activity   Alcohol use: Not Currently    Comment: not since pregnancy   Drug use: Yes    Types: Marijuana    Comment: last use 3 weeks ago (oct 10)   Sexual activity: Yes    Birth control/protection: None  Other Topics Concern   Not on file  Social History Narrative  [name] lives with her parents and two brothers in a house, all of which are smokers. She reports a good relationship with her family members and feels it is a safe place. She plans to continue living there with her infant post-partum.    Social Determinants of Health   Financial Resource Strain: Not on file  Food Insecurity: No Food Insecurity   Worried About Charity fundraiser in the Last Year: Never true   Ran Out of Food in the Last Year: Never true  Transportation Needs: No Transportation Needs   Lack of  Transportation (Medical): No   Lack of Transportation (Non-Medical): No  Physical Activity: Not on file  Stress: Not on file  Social Connections: Not on file  Intimate Partner Violence: Not on file    FAMILY HISTORY: Family History  Problem Relation Age of Onset   Hyperlipidemia Mother    Hypertension Mother    Hyperlipidemia Father    Hypertension Father    Diabetes Maternal Aunt    Hypertension Paternal Grandmother    Cancer Paternal Grandmother        breast   Diabetes Paternal Grandmother     ALLERGIES:  is allergic to amoxicillin and penicillins.  MEDICATIONS:  Current Outpatient Medications  Medication Sig Dispense Refill   iron polysaccharides (NIFEREX) 150 MG capsule Take 1 capsule (150 mg total) by mouth daily. 60 capsule 2   Blood Pressure Monitoring (BLOOD PRESSURE KIT) DEVI 1 Device by Does not apply route as needed. (Patient not taking: Reported on 04/21/2021) 1 each 0   hydrOXYzine (VISTARIL) 25 MG capsule Take 1 capsule (25 mg total) by mouth every 8 (eight) hours as needed for anxiety. 30 capsule 0   Prenatal Vit-Fe Fumarate-FA (PREPLUS) 27-1 MG TABS Take 1 tablet by mouth daily.     vitamin B-12 (CYANOCOBALAMIN) 1000 MCG tablet Take 1 tablet (1,000 mcg total) by mouth daily. 30 tablet 3   No current facility-administered medications for this visit.    REVIEW OF SYSTEMS:    .10 Point review of Systems was done is negative except as noted above.  PHYSICAL EXAMINATION: Telemedicine visit  LABORATORY DATA:  I have reviewed the data as listed  . CBC Latest Ref Rng & Units 06/21/2021 06/21/2021 05/20/2021  WBC 4.0 - 10.5 K/uL 10.4 - 6.4  Hemoglobin 12.0 - 15.0 g/dL 11.3(L) - 11.4  Hematocrit 34.0 - 46.6 % 34.2 34.0(L) 34.5  Platelets 150 - 400 K/uL 116(L) - 98(LL)    . CMP Latest Ref Rng & Units 06/21/2021 05/04/2021 03/03/2021  Glucose 70 - 99 mg/dL 96 82 85  BUN 6 - 20 mg/dL $Remove'8 8 7  'pWBWWPQ$ Creatinine 0.44 - 1.00 mg/dL 0.61 0.67 0.61  Sodium 135 - 145 mmol/L 135  139 137  Potassium 3.5 - 5.1 mmol/L 4.0 3.8 3.4(L)  Chloride 98 - 111 mmol/L 106 109 107  CO2 22 - 32 mmol/L 21(L) 21(L) 21(L)  Calcium 8.9 - 10.3 mg/dL 8.8(L) 8.6(L) 8.7(L)  Total Protein 6.5 - 8.1 g/dL 6.7 6.5 6.5  Total Bilirubin 0.3 - 1.2 mg/dL 0.3 0.4 0.3  Alkaline Phos 38 - 126 U/L 90 63 44  AST 15 - 41 U/L 12(L) 13(L) 30  ALT 0 - 44 U/L 12 9 32   . Lab Results  Component Value Date   IRON 73 06/21/2021   TIBC 591 (H) 06/21/2021   IRONPCTSAT 12 06/21/2021   (Iron and TIBC)  Lab Results  Component Value Date   FERRITIN 5 (L)  06/21/2021     RADIOGRAPHIC STUDIES: I have personally reviewed the radiological images as listed and agreed with the findings in the report. No results found.  ASSESSMENT & PLAN:   31 year old female with  #1 mild thrombocytopenia in 3rd trimester pregnancy -patient is normally ambulatory 3 weeks and her platelets on repeat labs today are stable 116k this is consistent with gestational thrombocytopenia. Plan -No indication for any specific intervention for the patient's gestational thrombocytopenia at this time. -Continue prenatal vitamins. -Avoid any medications that could cause thrombocytopenia.  #2 iron deficiency anemia in pregnancy Plan -Patient's iron levels were reviewed.  Her hemoglobin is reasonable at 11.3 but she is fairly iron deficient. -She is tolerating iron polysaccharide and was recommended to increase the dose to 150 mg p.o. twice daily to continue throughout pregnancy and for about 4 to 6 months after delivery. -Continue follow-up with OB/GYN to address her oral iron replacement based on follow-up labs.  Follow-up  F/u Dr. Irene Limbo as needed  All of the patients questions were answered with apparent satisfaction. The patient knows to call the clinic with any problems, questions or concerns.   Sullivan Lone MD [place] AAHIVMS [facility] Hematology/Oncology [facility]

## 2021-06-30 MED ORDER — POLYSACCHARIDE IRON COMPLEX 150 MG PO CAPS: 150.0000 mg | ORAL_CAPSULE | Freq: Two times a day (BID) | ORAL | 3 refills | Status: DC

## 2021-06-30 NOTE — Addendum Note (Signed)
Addended by: Sullivan Lone on: 06/30/2021 01:11 AM   Modules accepted: Orders

## 2021-07-02 NOTE — BH Specialist Note (Unsigned)
Integrated Behavioral Health via Telemedicine Visit  07/02/2021 Emillie Holding [id number]  Number of [place] visits: *** Session Start time: 10:45***  Session End time: 11:15*** Total time: {IBH Total Time:21014050}  Referring Provider: *** Patient/Family location: Home*** [facility] Provider location: [facility] [place] [facility]  All persons participating in visit: Patient *** and [place] ***  Types of Service: {CHL AMB TYPE OF SERVICE:[phone]}  I connected with Beverly Gust and/or [place]'s {family members:20773} via  Telephone or Geologist, engineering  (Video is Caregility application) and verified that I am speaking with the correct person using two identifiers. Discussed confidentiality: {YES/NO:21197}  I discussed the limitations of telemedicine and the availability of in person appointments.  Discussed there is a possibility of technology failure and discussed alternative modes of communication if that failure occurs.  I discussed that engaging in this telemedicine visit, they consent to the provision of behavioral healthcare and the services will be billed under their insurance.  Patient and/or legal guardian expressed understanding and consented to Telemedicine visit: {YES/NO:21197}  Presenting Concerns: Patient and/or family reports the following symptoms/concerns: *** Duration of problem: ***; Severity of problem: {Mild/Moderate/Severe:20260}  Patient and/or Family's Strengths/Protective Factors: {CHL AMB BH PROTECTIVE FACTORS:[phone]}  Goals Addressed: Patient will:  Reduce symptoms of: {IBH Symptoms:21014056}   Increase knowledge and/or ability of: {IBH Patient Tools:21014057}   Demonstrate ability to: {IBH Goals:21014053}  Progress towards Goals: {CHL AMB BH PROGRESS TOWARDS GOALS:[phone]}  Interventions: Interventions utilized:  {IBH  Interventions:21014054} Standardized Assessments completed: {IBH Screening Tools:21014051}  Patient and/or Family Response: ***  Assessment: Patient currently experiencing ***.   Patient may benefit from ***.  Plan: Follow up with behavioral health clinician on : *** Behavioral recommendations: *** Referral(s): {IBH Referrals:21014055}  I discussed the assessment and treatment plan with the patient and/or parent/guardian. They were provided an opportunity to ask questions and all were answered. They agreed with the plan and demonstrated an understanding of the instructions.   They were advised to call back or seek an in-person evaluation if the symptoms worsen or if the condition fails to improve as anticipated.  Caroleen Hamman Daeshawn Redmann, LCSW

## 2021-07-05 ENCOUNTER — Other Ambulatory Visit: Payer: Self-pay

## 2021-07-05 ENCOUNTER — Ambulatory Visit: Payer: Medicaid Other | Admitting: *Deleted

## 2021-07-05 ENCOUNTER — Ambulatory Visit: Payer: Medicaid Other | Attending: Obstetrics and Gynecology

## 2021-07-05 ENCOUNTER — Telehealth: Payer: Self-pay

## 2021-07-05 VITALS — BP 134/71 | HR 92

## 2021-07-05 DIAGNOSIS — O99323 Drug use complicating pregnancy, third trimester: Secondary | ICD-10-CM

## 2021-07-05 DIAGNOSIS — O99113 Other diseases of the blood and blood-forming organs and certain disorders involving the immune mechanism complicating pregnancy, third trimester: Secondary | ICD-10-CM

## 2021-07-05 DIAGNOSIS — D696 Thrombocytopenia, unspecified: Secondary | ICD-10-CM | POA: Diagnosis present

## 2021-07-05 DIAGNOSIS — Z3A34 34 weeks gestation of pregnancy: Secondary | ICD-10-CM

## 2021-07-05 DIAGNOSIS — O09299 Supervision of pregnancy with other poor reproductive or obstetric history, unspecified trimester: Secondary | ICD-10-CM | POA: Insufficient documentation

## 2021-07-05 DIAGNOSIS — O99119 Other diseases of the blood and blood-forming organs and certain disorders involving the immune mechanism complicating pregnancy, unspecified trimester: Secondary | ICD-10-CM | POA: Diagnosis present

## 2021-07-08 ENCOUNTER — Encounter: Payer: Medicaid Other | Attending: Nurse Practitioner | Admitting: Physical Therapy

## 2021-07-08 ENCOUNTER — Encounter: Payer: Self-pay | Admitting: Physical Therapy

## 2021-07-08 ENCOUNTER — Other Ambulatory Visit: Payer: Self-pay

## 2021-07-08 DIAGNOSIS — M545 Low back pain, unspecified: Secondary | ICD-10-CM | POA: Diagnosis present

## 2021-07-08 DIAGNOSIS — M6281 Muscle weakness (generalized): Secondary | ICD-10-CM | POA: Insufficient documentation

## 2021-07-08 DIAGNOSIS — R102 Pelvic and perineal pain: Secondary | ICD-10-CM | POA: Insufficient documentation

## 2021-07-08 NOTE — Therapy (Signed)
[place] at [facility] [address], [place], [place], [address] Phone: [phone]   Fax:  [phone]  Physical Therapy Treatment  Patient Details  Name: Veronica Jones MRN: [id number] Date of Birth: 28-Apr-1991 Referring Provider (PT): Earlie Server, NP   Encounter Date: 07/08/2021   PT End of Session - 07/08/21 1127     Visit Number 3    Date for PT Re-Evaluation 06/10/21    Authorization Type Medicaid    PT Start Time 1030    PT Stop Time 1120    PT Time Calculation (min) 50 min    Activity Tolerance Patient tolerated treatment well;No increased pain    Behavior During Therapy [facility] for tasks assessed/performed             Past Medical History:  Diagnosis Date   Chlamydia 2013   Hx of trichomoniasis    Postpartum eclampsia 04/19/2015    Past Surgical History:  Procedure Laterality Date   NO PAST SURGERIES      There were no vitals filed for this visit.   Subjective Assessment - 07/08/21 1036     Subjective The pelvic pain is better. I have 5 weeks left. I have pain in the left low back. Supine and rock legs to the right, I can not go all the way. I am not having the pelvic pain.    Patient Stated Goals reduce pain, how to manage pain    Currently in Pain? Yes    Pain Score 6     Pain Location Back    Pain Orientation Left    Pain Descriptors / Indicators Pressure    Pain Type Acute pain    Pain Onset More than a month ago    Pain Frequency Intermittent    Aggravating Factors  laying on back and moveing legs to the right, sitting upright and not able to lean back on something    Pain Relieving Factors move position    Multiple Pain Sites No                [facility] PT Assessment - 07/08/21 0001       Assessment   Medical Diagnosis O26.892; R10.2 Pelvic pain affecting pregnancy iin second trimester, antepartum    Referring Provider (PT) Earlie Server, NP    Onset Date/Surgical Date 12/11/20     Prior Therapy none      Precautions   Precautions Other (comment)    Precaution Comments pregnant      Restrictions   Weight Bearing Restrictions No      Prior Function   Level of Independence Independent    Vocation Part time employment    Vocation Requirements customer service    Leisure walking      Cognition   Overall Cognitive Status Within Functional Limits for tasks assessed      AROM   Overall AROM Comments lumbar left sidebend decreased by 25% with pain, left rotation decreased by 25%,      PROM   Right Hip Internal Rotation  20    Left Hip Internal Rotation  25      Strength   Right Hip ABduction 4/5    Left Hip ABduction 4-/5      Palpation   SI assessment  ASIS are equal                           [facility] Adult PT Treatment/Exercise -  07/08/21 0001       Self-Care   Self-Care Other Self-Care Comments    Other Self-Care Comments  post partum care with resting for 2 weeks, elevating her pelvis in supine to reduce swelling, using ice packs      Therapeutic Activites    Therapeutic Activities Other Therapeutic Activities    Other Therapeutic Activities discussed with patient the different positions to give birth so the baby can move through the different stage of the pelvis and how her partner can help her, discussed with patient on different breath to relax the pelvic floor and guide the baby through the canal      Exercises   Exercises Other Exercises    Other Exercises  supine, sitting, and quadruped pelvic floor contraction with abdominal bracing to do first week fo post natal      Manual Therapy   Manual Therapy Joint mobilization;Soft tissue mobilization    Joint Mobilization mobilization of left L2-L5 in sidley    Soft tissue mobilization manual work to the left lumbar paraspinals                     PT Education - 07/08/21 1123     Education Details Access Code: [email or web address]; instruction on different birthing positions;  transverse abdominal with pelvic floor contraction after birth, post natal care    Person(s) Educated Patient    Methods Explanation;Demonstration;Handout    Comprehension Verbalized understanding;Returned demonstration              PT Short Term Goals - 07/08/21 1051       PT SHORT TERM GOAL #1   Title independent with initial HEP    Time 3    Period Weeks    Status Achieved               PT Long Term Goals - 07/08/21 1051       PT LONG TERM GOAL #1   Title independent with  HEP    Time 6    Period Weeks    Status Achieved      PT LONG TERM GOAL #2   Title pain with walking to work decreased </= 3/10 due to improved mobility    Time 6    Period Weeks    Status Achieved      PT LONG TERM GOAL #3   Title pelvic pain with bending foward to open a drawer at work decreased </= 3/10 due to improve stability of lumbar sacral area    Time 6    Period Weeks    Status Achieved      PT LONG TERM GOAL #4   Title understand differnet ways to give birth to reduce strain on back and pelvis with correct breath    Time 6    Period Weeks    Status Achieved                   Plan - 07/08/21 1128     Clinical Impression Statement Patient is reporting no pelvic pain. She had pain on the left low back and limited trunk movement but after manual work there was no pain and full lumbar ROM. Patient was educated on ways to give birth, postnatal care, and breath work during delivery. She understands everything. Patient has met all of her goals and ready for discharge.    Personal Factors and Comorbidities Comorbidity 1;Fitness;Profession    Comorbidities Presently pregnant and due 08/13/21  Examination-Activity Limitations Continence;Locomotion Level;Stand    Stability/Clinical Decision Making Stable/Uncomplicated    Rehab Potential Excellent    PT Treatment/Interventions ADLs/Self Care Home Management;Moist Heat;Therapeutic activities;Therapeutic exercise;Neuromuscular  re-education;Patient/family education;Manual techniques;Taping;Spinal Manipulations    PT Next Visit Plan Discharge to HEP    PT Home Exercise Plan Access Code: [email or web address]    Consulted and Agree with Plan of Care Patient             Patient will benefit from skilled therapeutic intervention in order to improve the following deficits and impairments:  Pain, Decreased activity tolerance, Decreased range of motion, Increased muscle spasms, Decreased strength  Visit Diagnosis: Pelvic pain  Muscle weakness (generalized)  Acute left-sided low back pain without sciatica     Problem List Patient Active Problem List   Diagnosis Date Noted   [redacted] weeks gestation of pregnancy 06/03/2021   Anxiety 06/03/2021   [redacted] weeks gestation of pregnancy 05/20/2021   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   Rubella non-immune status, antepartum 03/01/2021   Pap smear abnormality of cervix with LGSIL 02/27/2021   Supervision of low-risk pregnancy 12/28/2020   Trichomonal vaginitis during pregnancy in first trimester 12/06/2020   History of eclampsia 03/12/2020    Earlie Counts, PT 07/08/21 11:30 AM  [place] at [facility] [address], [place], [place], [address] Phone: [phone]   Fax:  [phone]  Name: Veronica Jones MRN: [id number] Date of Birth: 02-08-1991  PHYSICAL THERAPY DISCHARGE SUMMARY  Visits from Start of Care: 3  Current functional level related to goals / functional outcomes: See above.   Remaining deficits: See above.    Education / Equipment: HEP   Patient agrees to discharge. Patient goals were met. Patient is being discharged due to meeting the stated rehab goals. Thank you for your referral. Earlie Counts, PT 07/08/21 11:31 AM

## 2021-07-08 NOTE — Patient Instructions (Signed)
Access Code: [email or web address] URL: [email or web address] Date: 07/08/2021 Prepared by: Eulis Foster  Exercises Supine Bridge - 1 x daily - 7 x weekly - 3 sets - 10 reps Quadruped Cat Cow - 1 x daily - 7 x weekly - 1 sets - 10 reps Seated Piriformis Stretch - 1 x daily - 7 x weekly - 1 sets - 2 reps - 30 sec hold Seated Hamstring Stretch - 1 x daily - 7 x weekly - 1 sets - 2 reps - 30 sec hold Supine Transversus Abdominis Bracing - Hands on Stomach - 3 x daily - 7 x weekly - 1 sets - 10 reps Quadruped Transversus Abdominis Bracing - 1 x daily - 7 x weekly - 3 sets - 10 reps Seated Transversus Abdominis Bracing - 1 x daily - 7 x weekly - 1 sets - 10 reps Eulis Foster, PT [facility] [address] [place], [place] [address] W: [phone] [email or web address]

## 2021-07-16 ENCOUNTER — Encounter: Payer: Medicaid Other | Admitting: Clinical

## 2021-07-22 ENCOUNTER — Ambulatory Visit (INDEPENDENT_AMBULATORY_CARE_PROVIDER_SITE_OTHER): Payer: Medicaid Other | Admitting: Obstetrics and Gynecology

## 2021-07-22 ENCOUNTER — Encounter: Payer: Self-pay | Admitting: Obstetrics and Gynecology

## 2021-07-22 ENCOUNTER — Other Ambulatory Visit: Payer: Self-pay

## 2021-07-22 VITALS — BP 112/75 | HR 106 | Wt 190.2 lb

## 2021-07-22 DIAGNOSIS — Z3493 Encounter for supervision of normal pregnancy, unspecified, third trimester: Secondary | ICD-10-CM

## 2021-07-22 DIAGNOSIS — O09899 Supervision of other high risk pregnancies, unspecified trimester: Secondary | ICD-10-CM

## 2021-07-22 DIAGNOSIS — Z2839 Other underimmunization status: Secondary | ICD-10-CM

## 2021-07-22 DIAGNOSIS — D696 Thrombocytopenia, unspecified: Secondary | ICD-10-CM | POA: Insufficient documentation

## 2021-07-22 DIAGNOSIS — O99113 Other diseases of the blood and blood-forming organs and certain disorders involving the immune mechanism complicating pregnancy, third trimester: Secondary | ICD-10-CM

## 2021-07-22 DIAGNOSIS — O99119 Other diseases of the blood and blood-forming organs and certain disorders involving the immune mechanism complicating pregnancy, unspecified trimester: Secondary | ICD-10-CM | POA: Insufficient documentation

## 2021-07-22 DIAGNOSIS — R87612 Low grade squamous intraepithelial lesion on cytologic smear of cervix (LGSIL): Secondary | ICD-10-CM

## 2021-07-22 LAB — OB RESULTS CONSOLE GC/CHLAMYDIA: Gonorrhea: NEGATIVE

## 2021-07-22 NOTE — Patient Instructions (Signed)

## 2021-07-22 NOTE — Progress Notes (Signed)
Subjective:  Veronica Jones is a 31 y.o. G3P1011 at [redacted]w[redacted]d being seen today for ongoing prenatal care.  She is currently monitored for the following issues for this high-risk pregnancy and has History of eclampsia; Supervision of low-risk pregnancy; Pap smear abnormality of cervix with LGSIL; Rubella non-immune status, antepartum; Anxiety; and Gestational thrombocytopenia ([place]) on their problem list.  Patient reports general discomforts of pregnancy.  Contractions: Irritability. Vag. Bleeding: None.  Movement: Present. Denies leaking of fluid.   The following portions of the patient's history were reviewed and updated as appropriate: allergies, current medications, past family history, past medical history, past social history, past surgical history and problem list. Problem list updated.  Objective:   Vitals:   07/22/21 1500  BP: 112/75  Pulse: (!) 106  Weight: 190 lb 3.2 oz (86.3 kg)    Fetal Status: Fetal Heart Rate (bpm): 146   Movement: Present     General:  Alert, oriented and cooperative. Patient is in no acute distress.  Skin: Skin is warm and dry. No rash noted.   Cardiovascular: Normal heart rate noted  Respiratory: Normal respiratory effort, no problems with respiration noted  Abdomen: Soft, gravid, appropriate for gestational age. Pain/Pressure: Present     Pelvic:  Cervical exam performed        Extremities: Normal range of motion.  Edema: Trace  Mental Status: Normal mood and affect. Normal behavior. Normal judgment and thought content.   Urinalysis:      Assessment and Plan:  Pregnancy: G3P1011 at [redacted]w[redacted]d  1. Encounter for supervision of low-risk pregnancy in third trimester Labor precautions - GC/Chlamydia probe amp ([place])not at [facility] - Strep Gp B Culture+Rflx  2. Benign gestational thrombocytopenia in third trimester ([place]) CBC today  3. Rubella non-immune status, antepartum Vaccine PP  4. Pap smear abnormality of cervix with LGSIL Follow up PP  Term  labor symptoms and general obstetric precautions including but not limited to vaginal bleeding, contractions, leaking of fluid and fetal movement were reviewed in detail with the patient. Please refer to After Visit Summary for other counseling recommendations.  Return in about 1 week (around 07/29/2021) for OB visit, face to face, MD only.   Chancy Milroy, MD

## 2021-07-22 NOTE — Progress Notes (Signed)
Pt states has been having Vaginal itching & irritation, think shes has a yeast infection.

## 2021-07-23 LAB — CBC
Hematocrit: 34.3 % (ref 34.0–46.6)
Hemoglobin: 11.7 g/dL (ref 11.1–15.9)
MCH: 30.2 pg (ref 26.6–33.0)
MCHC: 34.1 g/dL (ref 31.5–35.7)
MCV: 88 fL (ref 79–97)
Platelets: 108 10*3/uL — ABNORMAL LOW (ref 150–450)
RBC: 3.88 x10E6/uL (ref 3.77–5.28)
RDW: 14.1 % (ref 11.7–15.4)
WBC: 12.5 10*3/uL — ABNORMAL HIGH (ref 3.4–10.8)

## 2021-07-26 LAB — STREP GP B CULTURE+RFLX: Strep Gp B Culture+Rflx: NEGATIVE

## 2021-07-26 LAB — GC/CHLAMYDIA PROBE AMP (~~LOC~~) NOT AT ARMC
Chlamydia: NEGATIVE
Comment: NEGATIVE
Comment: NORMAL
Neisseria Gonorrhea: NEGATIVE

## 2021-08-02 ENCOUNTER — Other Ambulatory Visit: Payer: Self-pay

## 2021-08-02 ENCOUNTER — Encounter: Payer: Self-pay | Admitting: Obstetrics and Gynecology

## 2021-08-02 ENCOUNTER — Ambulatory Visit (INDEPENDENT_AMBULATORY_CARE_PROVIDER_SITE_OTHER): Payer: Medicaid Other | Admitting: Obstetrics and Gynecology

## 2021-08-02 ENCOUNTER — Other Ambulatory Visit (HOSPITAL_COMMUNITY): Payer: Self-pay | Admitting: Advanced Practice Midwife

## 2021-08-02 VITALS — BP 122/79 | HR 88 | Wt 191.7 lb

## 2021-08-02 DIAGNOSIS — D696 Thrombocytopenia, unspecified: Secondary | ICD-10-CM

## 2021-08-02 DIAGNOSIS — Z2839 Other underimmunization status: Secondary | ICD-10-CM

## 2021-08-02 DIAGNOSIS — O09899 Supervision of other high risk pregnancies, unspecified trimester: Secondary | ICD-10-CM

## 2021-08-02 DIAGNOSIS — R87612 Low grade squamous intraepithelial lesion on cytologic smear of cervix (LGSIL): Secondary | ICD-10-CM

## 2021-08-02 DIAGNOSIS — Z3493 Encounter for supervision of normal pregnancy, unspecified, third trimester: Secondary | ICD-10-CM

## 2021-08-02 DIAGNOSIS — O99113 Other diseases of the blood and blood-forming organs and certain disorders involving the immune mechanism complicating pregnancy, third trimester: Secondary | ICD-10-CM

## 2021-08-02 NOTE — Patient Instructions (Signed)
Vaginal Delivery ?Vaginal delivery means that you give birth by pushing your baby out of your birth canal (vagina). Your health care team will help you before, during, and after vaginal delivery. ?Birth experiences are unique for every woman and every pregnancy, and birth experiences vary depending on where you choose to give birth. ?What are the risks and benefits? ?Generally, this is safe. However, problems may occur, including: ?Bleeding. ?Infection. ?Damage to other structures such as vaginal tearing. ?Allergic reactions to medicines. ?Despite the risks, benefits of vaginal delivery include less risk of bleeding and infection and a shorter recovery time compared to a Cesarean delivery. Cesarean delivery, or C-section, is the surgical delivery of a baby. ?What happens when I arrive at the birth center or hospital? ?Once you are in labor and have been admitted into the hospital or birth center, your health care team may: ?Review your pregnancy history and any concerns that you have. ?Talk with you about your birth plan and discuss pain control options. ?Check your blood pressure, breathing, and heartbeat. ?Assess your baby's heartbeat. ?Monitor your uterus for contractions. ?Check whether your bag of water (amniotic sac) has broken (ruptured). ?Insert an IV into one of your veins. This may be used to give you fluids and medicines. ?Monitoring ?Your health care team may assess your contractions (uterine monitoring) and your baby's heart rate (fetal monitoring). You may need to be monitored: ?Often, but not continuously (intermittently). ?All the time or for long periods at a time (continuously). Continuous monitoring may be needed if: ?You are taking certain medicines, such as medicine to relieve pain or make your contractions stronger. ?You have pregnancy or labor complications. ?Monitoring may be done by: ?Placing a special stethoscope or a handheld monitoring device on your abdomen to check your baby's heartbeat  and to check for contractions. ?Placing monitors on your abdomen (external monitors) to record your baby's heartbeat and the frequency and length of contractions. ?Placing monitors inside your uterus through your vagina (internal monitors) to record your baby's heartbeat and the frequency, length, and strength of your contractions. Depending on the type of monitor, it may remain in your uterus or on your baby's head until birth. ?Telemetry. This is a type of continuous monitoring that can be done with external or internal monitors. Instead of having to stay in bed, you are able to move around. ?Physical exam ?Your health care team may perform frequent physical exams. This may include: ?Checking how and where your baby is positioned in your uterus. ?Checking your cervix to determine: ?Whether it is thinning out (effacing). ?Whether it is opening up (dilating). ?What happens during labor and delivery? ?Normal labor and delivery is divided into the following three stages: ?Stage 1 ?This is the longest stage of labor. ?Throughout this stage, you will feel contractions. Contractions generally feel mild, infrequent, and irregular at first. They get stronger, more frequent, and more regular as you move through this stage. You may have contractions about every 2-3 minutes. ?This stage ends when your cervix is completely dilated to 4 inches (10 cm) and completely effaced. ?Stage 2 ?This stage starts once your cervix is completely effaced and dilated and lasts until the delivery of your baby. ?This is the stage where you will feel an urge to push your baby out of your vagina. ?You may feel stretching and burning pain, especially when the widest part of your baby's head passes through the vaginal opening (crowning). ?Once your baby is delivered, the umbilical cord will be clamped and   cut. Timing of cutting the cord will depend on your wishes, your baby's health, and your health care provider's practices. ?Your baby will be  placed on your bare chest (skin-to-skin contact) in an upright position and covered with a warm blanket. If you are choosing to breastfeed, watch your baby for feeding cues, like rooting or sucking, and help the baby to your breast for his or her first feeding. ?Stage 3 ?This stage starts immediately after the birth of your baby and ends after you deliver the placenta. ?This stage may take anywhere from 5 to 30 minutes. ?After your baby has been delivered, you will feel contractions as your body expels the placenta. These contractions also help your uterus get smaller and reduce bleeding. ?What can I expect after labor and delivery? ?After labor is over, you and your baby will be assessed closely until you are ready to go home. Your health care team will teach you how to care for yourself and your baby. ?You and your baby may be encouraged to stay in the same room (rooming in) during your hospital stay. This will help promote early bonding and successful breastfeeding. ?Your uterus will be checked and massaged regularly (fundal massage). ?You may continue to receive fluids and medicines through an IV. ?You will have some soreness and pain in your abdomen, vagina, and the area of skin between your vaginal opening and your anus (perineum). ?If an incision was made near your vagina (episiotomy) or if you had some vaginal tearing during delivery, cold compresses may be placed on your episiotomy or your tear. This helps to reduce pain and swelling. ?It is normal to have vaginal bleeding after delivery. Wear a sanitary pad for vaginal bleeding and discharge. ?Summary ?Vaginal delivery means that you will give birth by pushing your baby out of your birth canal (vagina). ?Your health care team will monitor you and your baby throughout the stages of labor. ?After you deliver your baby, your health care team will continue to assess you and your baby to ensure you are both recovering as expected after delivery. ?This  information is not intended to replace advice given to you by your health care provider. Make sure you discuss any questions you have with your health care provider. ?Document Revised: 04/27/2020 Document Reviewed: 04/27/2020 ?Elsevier Patient Education ? 2022 [employer] ? ?

## 2021-08-02 NOTE — Progress Notes (Signed)
Subjective:  Veronica Jones is a 31 y.o. G3P1011 at [redacted]w[redacted]d being seen today for ongoing prenatal care.  She is currently monitored for the following issues for this low-risk pregnancy and has History of eclampsia; Supervision of low-risk pregnancy; Pap smear abnormality of cervix with LGSIL; Rubella non-immune status, antepartum; Anxiety; and Gestational thrombocytopenia (HCC) on their problem list.  Patient reports general discomforts of pregnancy.  Contractions: Not present. Vag. Bleeding: None.  Movement: Present. Denies leaking of fluid.   The following portions of the patient's history were reviewed and updated as appropriate: allergies, current medications, past family history, past medical history, past social history, past surgical history and problem list. Problem list updated.  Objective:   Vitals:   08/02/21 1326  BP: 122/79  Pulse: 88  Weight: 191 lb 11.2 oz (87 kg)    Fetal Status: Fetal Heart Rate (bpm): 145   Movement: Present     General:  Alert, oriented and cooperative. Patient is in no acute distress.  Skin: Skin is warm and dry. No rash noted.   Cardiovascular: Normal heart rate noted  Respiratory: Normal respiratory effort, no problems with respiration noted  Abdomen: Soft, gravid, appropriate for gestational age. Pain/Pressure: Present     Pelvic:  Cervical exam deferred        Extremities: Normal range of motion.  Edema: None  Mental Status: Normal mood and affect. Normal behavior. Normal judgment and thought content.   Urinalysis:      Assessment and Plan:  Pregnancy: G3P1011 at [redacted]w[redacted]d  1. Encounter for supervision of low-risk pregnancy in third trimester Stable Labor precatuions  2. Benign gestational thrombocytopenia in third trimester (HCC) IOL at 39 weeks Last Plt count 108  3. Rubella non-immune status, antepartum Vaccine PP  4. Pap smear abnormality of cervix with LGSIL Follow up PP  Term labor symptoms and general obstetric precautions  including but not limited to vaginal bleeding, contractions, leaking of fluid and fetal movement were reviewed in detail with the patient. Please refer to After Visit Summary for other counseling recommendations.  No follow-ups on file.   Hermina Staggers, MD

## 2021-08-03 ENCOUNTER — Inpatient Hospital Stay (HOSPITAL_COMMUNITY)
Admission: AD | Admit: 2021-08-03 | Discharge: 2021-08-04 | DRG: 807 | Disposition: A | Discharge status: Home / Self Care | Payer: Medicaid Other | Attending: Family Medicine | Admitting: Family Medicine

## 2021-08-03 ENCOUNTER — Inpatient Hospital Stay (HOSPITAL_COMMUNITY): Payer: Medicaid Other | Admitting: Anesthesiology

## 2021-08-03 ENCOUNTER — Encounter (HOSPITAL_COMMUNITY): Payer: Self-pay | Admitting: Obstetrics and Gynecology

## 2021-08-03 ENCOUNTER — Other Ambulatory Visit: Payer: Self-pay

## 2021-08-03 DIAGNOSIS — Z23 Encounter for immunization: Secondary | ICD-10-CM

## 2021-08-03 DIAGNOSIS — R87612 Low grade squamous intraepithelial lesion on cytologic smear of cervix (LGSIL): Secondary | ICD-10-CM | POA: Diagnosis present

## 2021-08-03 DIAGNOSIS — D6959 Other secondary thrombocytopenia: Secondary | ICD-10-CM | POA: Diagnosis present

## 2021-08-03 DIAGNOSIS — Z87891 Personal history of nicotine dependence: Secondary | ICD-10-CM | POA: Diagnosis not present

## 2021-08-03 DIAGNOSIS — O99324 Drug use complicating childbirth: Secondary | ICD-10-CM | POA: Diagnosis not present

## 2021-08-03 DIAGNOSIS — Z20822 Contact with and (suspected) exposure to covid-19: Secondary | ICD-10-CM | POA: Diagnosis present

## 2021-08-03 DIAGNOSIS — O99113 Other diseases of the blood and blood-forming organs and certain disorders involving the immune mechanism complicating pregnancy, third trimester: Secondary | ICD-10-CM | POA: Diagnosis present

## 2021-08-03 DIAGNOSIS — Z3A38 38 weeks gestation of pregnancy: Secondary | ICD-10-CM | POA: Diagnosis not present

## 2021-08-03 DIAGNOSIS — Z3493 Encounter for supervision of normal pregnancy, unspecified, third trimester: Secondary | ICD-10-CM

## 2021-08-03 DIAGNOSIS — D696 Thrombocytopenia, unspecified: Secondary | ICD-10-CM | POA: Diagnosis present

## 2021-08-03 DIAGNOSIS — O99119 Other diseases of the blood and blood-forming organs and certain disorders involving the immune mechanism complicating pregnancy, unspecified trimester: Secondary | ICD-10-CM | POA: Diagnosis present

## 2021-08-03 DIAGNOSIS — O09899 Supervision of other high risk pregnancies, unspecified trimester: Secondary | ICD-10-CM

## 2021-08-03 DIAGNOSIS — Z880 Allergy status to penicillin: Secondary | ICD-10-CM

## 2021-08-03 DIAGNOSIS — O9912 Other diseases of the blood and blood-forming organs and certain disorders involving the immune mechanism complicating childbirth: Secondary | ICD-10-CM | POA: Diagnosis present

## 2021-08-03 DIAGNOSIS — Z3490 Encounter for supervision of normal pregnancy, unspecified, unspecified trimester: Secondary | ICD-10-CM

## 2021-08-03 DIAGNOSIS — Z8759 Personal history of other complications of pregnancy, childbirth and the puerperium: Secondary | ICD-10-CM

## 2021-08-03 LAB — CBC
HCT: 38.8 % (ref 36.0–46.0)
Hemoglobin: 12.6 g/dL (ref 12.0–15.0)
MCH: 29.6 pg (ref 26.0–34.0)
MCHC: 32.5 g/dL (ref 30.0–36.0)
MCV: 91.1 fL (ref 80.0–100.0)
Platelets: 111 10*3/uL — ABNORMAL LOW (ref 150–400)
RBC: 4.26 MIL/uL (ref 3.87–5.11)
RDW: 15.4 % (ref 11.5–15.5)
WBC: 11.1 10*3/uL — ABNORMAL HIGH (ref 4.0–10.5)
nRBC: 0 % (ref 0.0–0.2)

## 2021-08-03 LAB — TYPE AND SCREEN
ABO/RH(D): O POS
Antibody Screen: NEGATIVE

## 2021-08-03 LAB — RESP PANEL BY RT-PCR (FLU A&B, COVID) ARPGX2
Influenza A by PCR: NEGATIVE
Influenza B by PCR: NEGATIVE
SARS Coronavirus 2 by RT PCR: NEGATIVE

## 2021-08-03 LAB — RPR: RPR Ser Ql: NONREACTIVE

## 2021-08-03 MED ORDER — BENZOCAINE-MENTHOL 20-0.5 % EX AERO
1.0000 "application " | INHALATION_SPRAY | CUTANEOUS | Status: DC | PRN
  Administered 2021-08-03: 1 via TOPICAL
  Filled 2021-08-03: qty 56

## 2021-08-03 MED ORDER — PRENATAL MULTIVITAMIN CH
1.0000 | ORAL_TABLET | Freq: Every day | ORAL | Status: DC
  Administered 2021-08-04: 1 via ORAL
  Filled 2021-08-03: qty 1

## 2021-08-03 MED ORDER — WITCH HAZEL-GLYCERIN EX PADS: 1.0000 "application " | MEDICATED_PAD | CUTANEOUS | Status: DC | PRN

## 2021-08-03 MED ORDER — DIBUCAINE (PERIANAL) 1 % EX OINT: 1.0000 "application " | TOPICAL_OINTMENT | CUTANEOUS | Status: DC | PRN

## 2021-08-03 MED ORDER — SIMETHICONE 80 MG PO CHEW: 80.0000 mg | CHEWABLE_TABLET | ORAL | Status: DC | PRN

## 2021-08-03 MED ORDER — MEDROXYPROGESTERONE ACETATE 150 MG/ML IM SUSP: 150.0000 mg | INTRAMUSCULAR | Status: DC | PRN

## 2021-08-03 MED ORDER — EPHEDRINE 5 MG/ML INJ: 10.0000 mg | INTRAVENOUS | Status: DC | PRN

## 2021-08-03 MED ORDER — OXYTOCIN-SODIUM CHLORIDE 30-0.9 UT/500ML-% IV SOLN
2.5000 [IU]/h | INTRAVENOUS | Status: DC
  Filled 2021-08-03: qty 500

## 2021-08-03 MED ORDER — DIPHENHYDRAMINE HCL 50 MG/ML IJ SOLN
12.5000 mg | INTRAMUSCULAR | Status: DC | PRN
  Administered 2021-08-03: 12.5 mg via INTRAVENOUS
  Filled 2021-08-03: qty 1

## 2021-08-03 MED ORDER — ACETAMINOPHEN 325 MG PO TABS: 650.0000 mg | ORAL_TABLET | ORAL | Status: DC | PRN

## 2021-08-03 MED ORDER — ONDANSETRON HCL 4 MG/2ML IJ SOLN: 4.0000 mg | Freq: Four times a day (QID) | INTRAMUSCULAR | Status: DC | PRN

## 2021-08-03 MED ORDER — COCONUT OIL OIL: 1.0000 "application " | TOPICAL_OIL | Status: DC | PRN

## 2021-08-03 MED ORDER — PHENYLEPHRINE 40 MCG/ML (10ML) SYRINGE FOR IV PUSH (FOR BLOOD PRESSURE SUPPORT): 80.0000 ug | PREFILLED_SYRINGE | INTRAVENOUS | Status: DC | PRN

## 2021-08-03 MED ORDER — DIPHENHYDRAMINE HCL 25 MG PO CAPS: 25.0000 mg | ORAL_CAPSULE | Freq: Four times a day (QID) | ORAL | Status: DC | PRN

## 2021-08-03 MED ORDER — LIDOCAINE HCL (PF) 1 % IJ SOLN
30.0000 mL | INTRAMUSCULAR | Status: DC | PRN
Start: 2021-08-03 — End: 2021-08-03

## 2021-08-03 MED ORDER — OXYCODONE-ACETAMINOPHEN 5-325 MG PO TABS: 2.0000 | ORAL_TABLET | ORAL | Status: DC | PRN

## 2021-08-03 MED ORDER — LACTATED RINGERS IV SOLN: 500.0000 mL | INTRAVENOUS | Status: DC | PRN

## 2021-08-03 MED ORDER — ONDANSETRON HCL 4 MG/2ML IJ SOLN: 4.0000 mg | INTRAMUSCULAR | Status: DC | PRN

## 2021-08-03 MED ORDER — OXYTOCIN BOLUS FROM INFUSION
333.0000 mL | Freq: Once | INTRAVENOUS | Status: AC
  Administered 2021-08-03: 333 mL via INTRAVENOUS

## 2021-08-03 MED ORDER — LACTATED RINGERS IV SOLN: 500.0000 mL | Freq: Once | INTRAVENOUS | Status: DC

## 2021-08-03 MED ORDER — FENTANYL CITRATE (PF) 100 MCG/2ML IJ SOLN
50.0000 ug | INTRAMUSCULAR | Status: DC | PRN
  Administered 2021-08-03 (×2): 100 ug via INTRAVENOUS
  Filled 2021-08-03 (×2): qty 2

## 2021-08-03 MED ORDER — SOD CITRATE-CITRIC ACID 500-334 MG/5ML PO SOLN: 30.0000 mL | ORAL | Status: DC | PRN

## 2021-08-03 MED ORDER — SENNOSIDES-DOCUSATE SODIUM 8.6-50 MG PO TABS
2.0000 | ORAL_TABLET | Freq: Every day | ORAL | Status: DC
  Administered 2021-08-04: 2 via ORAL
  Filled 2021-08-03: qty 2

## 2021-08-03 MED ORDER — OXYCODONE-ACETAMINOPHEN 5-325 MG PO TABS: 1.0000 | ORAL_TABLET | ORAL | Status: DC | PRN

## 2021-08-03 MED ORDER — ZOLPIDEM TARTRATE 5 MG PO TABS: 5.0000 mg | ORAL_TABLET | Freq: Every evening | ORAL | Status: DC | PRN

## 2021-08-03 MED ORDER — LACTATED RINGERS IV SOLN
INTRAVENOUS | Status: DC
Start: 2021-08-03 — End: 2021-08-03

## 2021-08-03 MED ORDER — LIDOCAINE-EPINEPHRINE (PF) 2 %-1:200000 IJ SOLN
INTRAMUSCULAR | Status: DC | PRN
Start: 2021-08-03 — End: 2021-08-03
  Administered 2021-08-03: 5 mL via EPIDURAL

## 2021-08-03 MED ORDER — TETANUS-DIPHTH-ACELL PERTUSSIS 5-2.5-18.5 LF-MCG/0.5 IM SUSY: 0.5000 mL | PREFILLED_SYRINGE | Freq: Once | INTRAMUSCULAR | Status: DC

## 2021-08-03 MED ORDER — IBUPROFEN 600 MG PO TABS
600.0000 mg | ORAL_TABLET | Freq: Four times a day (QID) | ORAL | Status: DC
  Administered 2021-08-03 – 2021-08-04 (×5): 600 mg via ORAL
  Filled 2021-08-03 (×5): qty 1

## 2021-08-03 MED ORDER — FENTANYL-BUPIVACAINE-NACL 0.5-0.125-0.9 MG/250ML-% EP SOLN
12.0000 mL/h | EPIDURAL | Status: DC | PRN
  Administered 2021-08-03: 12 mL/h via EPIDURAL
  Filled 2021-08-03: qty 250

## 2021-08-03 MED ORDER — ONDANSETRON HCL 4 MG PO TABS: 4.0000 mg | ORAL_TABLET | ORAL | Status: DC | PRN

## 2021-08-03 NOTE — Progress Notes (Signed)
Labor Progress Note Veronica Jones is a 31 y.o. G3P1011 at [redacted]w[redacted]d presented for labor  S:  More uncomfortable with ctx, tearful, states "I'm scared"  O:  BP 126/62    Pulse 68    Temp 97.9 F (36.6 C) (Oral)    Resp 16    Ht 5\' 4"  (1.626 m)    Wt 88.2 kg    LMP 11/06/2020 (Exact Date)    BMI 33.37 kg/m  EFM: baseline 135 bpm/ mod variability/ + accels/ rare variable decels  Toco/IUPC: indeterminable SVE: Dilation: 7 Effacement (%): 100 Cervical Position: Middle Station: -1 Presentation: Vertex Exam by:: Sammi Stolarz, cnm   A/P: 31 y.o. G3P1011 [redacted]w[redacted]d  1. Labor: active 2. FWB: Cat I 3. Pain: labor support w/o meds per request   Offered AROM to facilitate delivery since she doesn't want pain meds or epidural. Informed it will intensify pain/ctx, agrees to procedure. AROM for small amt clear fluid. Anticipate labor progress and SVD.  [redacted]w[redacted]d, CNM 12:05 PM

## 2021-08-03 NOTE — Discharge Summary (Signed)
Postpartum Discharge Summary  Date of Service updated     Patient Name: Veronica Jones DOB: 1990/10/10 MRN: [id number]  Date of admission: 08/03/2021 Delivery date:08/03/2021  Delivering provider: Julianne Handler  Date of discharge: 08/04/2021  Admitting diagnosis: Gestational thrombocytopenia ([place]) [O99.119, D69.6] Intrauterine pregnancy: [redacted]w[redacted]d    Secondary diagnosis:  Principal Problem:   Gestational thrombocytopenia ([place] Active Problems:   History of eclampsia   Pap smear abnormality of cervix with LGSIL   Rubella non-immune status, antepartum   SVD (spontaneous vaginal delivery)  Additional problems: none    Discharge diagnosis: Term Pregnancy Delivered                                              Post partum procedures: None Augmentation: AROM Complications: None  Hospital course: Onset of Labor With Vaginal Delivery      31y.o. yo G3P1011 at 376w4das admitted in Active Labor on 08/03/2021. Patient had an uncomplicated labor course as follows:  Membrane Rupture Time/Date: 11:59 AM ,08/03/2021   Delivery Method:Vaginal, Spontaneous  Episiotomy: None  Lacerations:  None  Patient had an uncomplicated postpartum course.  She is ambulating, tolerating a regular diet, passing flatus, and urinating well. Patient is discharged home in stable condition on 08/04/21. Her BP was WNL, but given her history of postpartum eclampsia, she was given a short course of lasix at discharge.   Newborn Data: Birth date:08/03/2021  Birth time:3:56 PM  Gender:Female  Living status:Living  Apgars:9 ,9  Weight:2886 g   Magnesium Sulfate received: No BMZ received: No Rhophylac:N/A MMR: Offered  T-DaP:Given prenatally Flu: Yes Transfusion:No  Physical exam  Vitals:   08/03/21 2325 08/04/21 0431 08/04/21 0845 08/04/21 1515  BP: 128/71 113/70 114/65 122/77  Pulse: 67 68 78 65  Resp: _0 Temp: 98 F (36.7 C) 98.4 F (36.9 C) 98.1 F (36.7 C) 98.7 F (37.1 C)  TempSrc:  Oral Oral Oral Oral  SpO2: 100% 98% 99% 100%  Weight:      Height:       General: alert, cooperative, and no distress Lochia: appropriate Uterine Fundus: firm Incision: N/A DVT Evaluation: No significant calf/ankle edema. Labs: Lab Results  Component Value Date   WBC 11.1 (H) 08/03/2021   HGB 12.6 08/03/2021   HCT 38.8 08/03/2021   MCV 91.1 08/03/2021   PLT 111 (L) 08/03/2021   CMP Latest Ref Rng & Units 06/21/2021  Glucose 70 - 99 mg/dL 96  BUN 6 - 20 mg/dL 8  Creatinine 0.44 - 1.00 mg/dL 0.61  Sodium 135 - 145 mmol/L 135  Potassium 3.5 - 5.1 mmol/L 4.0  Chloride 98 - 111 mmol/L 106  CO2 22 - 32 mmol/L 21(L)  Calcium 8.9 - 10.3 mg/dL 8.8(L)  Total Protein 6.5 - 8.1 g/dL 6.7  Total Bilirubin 0.3 - 1.2 mg/dL 0.3  Alkaline Phos 38 - 126 U/L 90  AST 15 - 41 U/L 12(L)  ALT 0 - 44 U/L 12   Edinburgh Score: Edinburgh Postnatal Depression Scale Screening Tool 08/04/2021  I have been able to laugh and see the funny side of things. 1  I have looked forward with enjoyment to things. 1  I have blamed myself unnecessarily when things went wrong. 2  I have been anxious or worried for no good reason. 2  I have felt scared or  panicky for no good reason. 2  Things have been getting on top of me. 2  I have been so unhappy that I have had difficulty sleeping. 1  I have felt sad or miserable. 0  I have been so unhappy that I have been crying. 2  The thought of harming myself has occurred to me. 0  Edinburgh Postnatal Depression Scale Total 13     After visit meds:  Allergies as of 08/04/2021       Reactions   Amoxicillin Hives   Penicillins Hives   Has patient had a PCN reaction causing immediate rash, facial/tongue/throat swelling, SOB or lightheadedness with hypotension: No Has patient had a PCN reaction causing severe rash involving mucus membranes or skin necrosis: No Has patient had a PCN reaction that required hospitalization No Has patient had a PCN reaction occurring  within the last 10 years: No If all of the above answers are "NO", then may proceed with Cephalosporin use.        Medication List     STOP taking these medications    aspirin EC 81 MG tablet   hydrOXYzine 25 MG capsule Commonly known as: Vistaril       TAKE these medications    acetaminophen 325 MG tablet Commonly known as: Tylenol Take 3 tablets (975 mg total) by mouth every 6 (six) hours as needed (for pain scale < 4).   Blood Pressure Kit Devi 1 Device by Does not apply route as needed.   furosemide 20 MG tablet Commonly known as: Lasix Take 1 tablet (20 mg total) by mouth daily for 4 days.   ibuprofen 600 MG tablet Commonly known as: ADVIL Take 1 tablet (600 mg total) by mouth every 6 (six) hours as needed.   iron polysaccharides 150 MG capsule Commonly known as: NIFEREX Take 1 capsule (150 mg total) by mouth 2 (two) times daily. What changed: when to take this   PrePLUS 27-1 MG Tabs Take 1 tablet by mouth daily.   vitamin B-12 1000 MCG tablet Commonly known as: CYANOCOBALAMIN Take 1 tablet (1,000 mcg total) by mouth daily.         Discharge home in stable condition Infant Feeding: Breast Infant Disposition:home with mother Discharge instruction: per After Visit Summary and Postpartum booklet. Activity: Advance as tolerated. Pelvic rest for 6 weeks.  Diet: routine diet Future Appointments: Future Appointments  Date Time Provider [place]  09/14/2021  3:15 PM Chancy Milroy, MD [facility]   Follow up Visit:   Please schedule this patient for a Virtual postpartum visit in 6 weeks with the following provider: Any provider. Additional Postpartum F/U: needs pp colpo   Low risk pregnancy complicated by:  thrombocytopenia Delivery mode:  Vaginal, Spontaneous  Anticipated Birth Control:  POPs at pp visit   08/04/2021 Patriciaann Clan, DO

## 2021-08-03 NOTE — Lactation Note (Addendum)
This note was copied from a baby's chart. Lactation Consultation Note  Patient Name: Veronica Jones ZCHYI'[id number] Date: 08/03/2021 Reason for consult: Initial assessment;Early term 37-38.6wks Age:29 hours, P2 female infant. LC entered room, mom holding  sleeping infant and was doing skin to skin. LC did not observe latch, per mom, infant recently breastfeed for 5 minutes.  Look at maternal data below for mom's past breastfeeding experience. Mom knows to call RN/LC for latch assistance if needed, [facility] written name on white board. Mom knows to breastfeed infant according to hunger cues, 8 to 12 times within 24 hours, skin to skin. Mom will attempt to latch infant on both breast during a feeding, if possible. Mom knows to hand express and give infant back her EBM, if infant doesn't latch at the breast. Mom made aware of O/P services, breastfeeding support groups, community resources, and our phone # for post-discharge questions.   Maternal Data Has patient been taught Hand Expression?: Yes Does the patient have breastfeeding experience prior to this delivery?: Yes How long did the patient breastfeed?: Per mom, she attempted to BF her 1st child but had difficulties.  Feeding Mother's Current Feeding Choice: Breast Milk  LATCH Score                    Lactation Tools Discussed/Used    Interventions Interventions: Breast feeding basics reviewed;Skin to skin;Hand express;Expressed milk;Education;LC Services brochure  Discharge    Consult Status Consult Status: Follow-up Date: 08/04/21 Follow-up type: In-patient    Danelle Earthly 08/03/2021, 10:01 PM

## 2021-08-03 NOTE — MAU Note (Signed)
PT SAYS - PNV WITH  CLINIC  NO VE  TODAY  INDUCTION ON Friday UC'S STRONG SINCE 0430 DENIES HSV GBS- NEG

## 2021-08-03 NOTE — Anesthesia Preprocedure Evaluation (Addendum)
Anesthesia Evaluation  Patient identified by MRN, date of birth, ID band Patient awake    Reviewed: Allergy & Precautions, NPO status , Patient's Chart, lab work & pertinent test results  Airway Mallampati: II  TM Distance: >3 FB Neck ROM: Full    Dental no notable dental hx.    Pulmonary neg pulmonary ROS, former smoker,    Pulmonary exam normal breath sounds clear to auscultation       Cardiovascular negative cardio ROS Normal cardiovascular exam Rhythm:Regular Rate:Normal     Neuro/Psych Seizures - (h/o postpartum eclampsia with first pregnancy),  PSYCHIATRIC DISORDERS Anxiety    GI/Hepatic negative GI ROS, (+)     substance abuse  marijuana use,   Endo/Other  negative endocrine ROS  Renal/GU negative Renal ROS  negative genitourinary   Musculoskeletal negative musculoskeletal ROS (+)   Abdominal   Peds  Hematology negative hematology ROS (+)   Anesthesia Other Findings Presents in labor  Reproductive/Obstetrics (+) Pregnancy                             Anesthesia Physical Anesthesia Plan  ASA: 2  Anesthesia Plan: Epidural   Post-op Pain Management:    Induction:   PONV Risk Score and Plan: Treatment may vary due to age or medical condition  Airway Management Planned: Natural Airway  Additional Equipment:   Intra-op Plan:   Post-operative Plan:   Informed Consent: I have reviewed the patients History and Physical, chart, labs and discussed the procedure including the risks, benefits and alternatives for the proposed anesthesia with the patient or authorized representative who has indicated his/her understanding and acceptance.       Plan Discussed with: Anesthesiologist  Anesthesia Plan Comments: (Patient identified. Risks, benefits, options discussed with patient including but not limited to bleeding, infection, nerve damage, paralysis, failed block, incomplete  pain control, headache, blood pressure changes, nausea, vomiting, reactions to medication, itching, and post partum back pain. Confirmed with bedside nurse the patient's most recent platelet count. Confirmed with the patient that they are not taking any anticoagulation, have any bleeding history or any family history of bleeding disorders. Patient expressed understanding and wishes to proceed. All questions were answered. )        Anesthesia Quick Evaluation

## 2021-08-03 NOTE — Lactation Note (Addendum)
This note was copied from a baby's chart. Lactation Consultation Note  Patient Name: Veronica Jones [id number]'T Date: 08/03/2021 Reason for consult: L&D Initial assessment;Mother's request;1st time breastfeeding;Early term 37-38.6wks;Breastfeeding assistance Age:29 hours LC assisted with latching infant in cross cradle prone with signs of milk transfer. Infant still feeding at the end of the [facility] visit.  Mom feeding plan to EBF and offer EBM back to infant from pumping.  Mom to feed 8-12x 24hr period by cues.  All questions answered at the end of the visit.  Maternal Data Does the patient have breastfeeding experience prior to this delivery?: Yes How long did the patient breastfeed?: few days in the hospital, infant did not latch  Feeding Mother's Current Feeding Choice: Breast Milk  LATCH Score Latch: Repeated attempts needed to sustain latch, nipple held in mouth throughout feeding, stimulation needed to elicit sucking reflex.  Audible Swallowing: Spontaneous and intermittent  Type of Nipple: Everted at rest and after stimulation  Comfort (Breast/Nipple): Soft / non-tender  Hold (Positioning): Assistance needed to correctly position infant at breast and maintain latch.  LATCH Score: 8   Lactation Tools Discussed/Used    Interventions Interventions: Breast feeding basics reviewed;Assisted with latch;Skin to skin;Hand express;Breast compression;Adjust position;Support pillows;Education  Discharge    Consult Status Consult Status: Follow-up from L&D Date: 08/04/21 Follow-up type: In-patient    Daquana Paddock  Nicholson-Springer 08/03/2021, 4:50 PM

## 2021-08-03 NOTE — Anesthesia Procedure Notes (Signed)
Epidural Patient location during procedure: OB Start time: 08/03/2021 1:55 PM End time: 08/03/2021 2:05 PM  Staffing Anesthesiologist: Elmer Picker, MD Performed: anesthesiologist   Preanesthetic Checklist Completed: patient identified, IV checked, risks and benefits discussed, monitors and equipment checked, pre-op evaluation and timeout performed  Epidural Patient position: sitting Prep: DuraPrep and site prepped and draped Patient monitoring: continuous pulse ox, blood pressure, heart rate and cardiac monitor Approach: midline Location: L3-L4 Injection technique: LOR air  Needle:  Needle type: Tuohy  Needle gauge: 17 G Needle length: 9 cm Needle insertion depth: 7 cm Catheter type: closed end flexible Catheter size: 19 Gauge Catheter at skin depth: 12 cm Test dose: negative  Assessment Sensory level: T8 Events: blood not aspirated, injection not painful, no injection resistance, no paresthesia and negative IV test  Additional Notes Patient identified. Risks/Benefits/Options discussed with patient including but not limited to bleeding, infection, nerve damage, paralysis, failed block, incomplete pain control, headache, blood pressure changes, nausea, vomiting, reactions to medication both or allergic, itching and postpartum back pain. Confirmed with bedside nurse the patient's most recent platelet count. Confirmed with patient that they are not currently taking any anticoagulation, have any bleeding history or any family history of bleeding disorders. Patient expressed understanding and wished to proceed. All questions were answered. Sterile technique was used throughout the entire procedure. Please see nursing notes for vital signs. Test dose was given through epidural catheter and negative prior to continuing to dose epidural or start infusion. Warning signs of high block given to the patient including shortness of breath, tingling/numbness in hands, complete motor block,  or any concerning symptoms with instructions to call for help. Patient was given instructions on fall risk and not to get out of bed. All questions and concerns addressed with instructions to call with any issues or inadequate analgesia.  Reason for block:procedure for pain

## 2021-08-03 NOTE — H&P (Signed)
OBSTETRIC ADMISSION HISTORY AND PHYSICAL  Veronica Jones is a 31 y.o. female G56P1011 with IUP at 90w4dpresenting for labor. She reports +FMs. No LOF, VB, blurry vision, headaches, peripheral edema, or RUQ pain. She plans on breastfeeding. She requests POPs for birth control.  Dating: By LMP --->  Estimated Date of Delivery: 08/13/21  Sono:    _0 , normal anatomy, ceph presentation, 2576g, 63%ile, EFW 5'11   Prenatal History/Complications: -gestational thrombocytopenia -anxiety -rubella non-immune -abnormal papsmear -marijuana use  Past Medical History: Past Medical History:  Diagnosis Date   Chlamydia 2013   Hx of trichomoniasis    Postpartum eclampsia 04/19/2015    Past Surgical History: Past Surgical History:  Procedure Laterality Date   NO PAST SURGERIES      Obstetrical History: OB History     Gravida  3   Para  1   Term  1   Preterm      AB  1   Living  1      SAB      IAB      Ectopic  1   Multiple  0   Live Births  1           Social History: Social History   Socioeconomic History   Marital status: Single    Spouse name: Not on file   Number of children: 0   Years of education: Not on file   Highest education level: Not on file  Occupational History   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 She reports a good  relationship with her family members and feels it is a safe place. She plans to continue living there with her infant post-partum.    Social Determinants of Health   Financial Resource Strain: Not on file  Food Insecurity: No Food Insecurity   Worried About RCharity fundraiserin the Last Year: Never true   Ran Out of Food in the Last Year: Never true  Transportation Needs: No Transportation Needs   Lack of Transportation (Medical): No   Lack of Transportation (Non-Medical): No  Physical Activity: Not on file  Stress: Not on file  Social Connections: Not on file    Family History: Family History  Problem Relation Age of Onset   Hyperlipidemia Mother    Hypertension Mother    Hyperlipidemia Father    Hypertension Father    Diabetes Maternal Aunt    Hypertension Paternal Grandmother    Cancer Paternal Grandmother        breast   Diabetes Paternal Grandmother     Allergies: Allergies  Allergen Reactions   Amoxicillin Hives   Penicillins Hives    Has patient had  a PCN reaction causing immediate rash, facial/tongue/throat swelling, SOB or lightheadedness with hypotension: No Has patient had a PCN reaction causing severe rash involving mucus membranes or skin necrosis: No Has patient had a PCN reaction that required hospitalization No Has patient had a PCN reaction occurring within the last 10 years: No If all of the above answers are "NO", then may proceed with Cephalosporin use.     Medications Prior to Admission  Medication Sig Dispense Refill Last Dose   aspirin EC 81 MG tablet Take 81 mg by mouth daily. Swallow whole.   08/02/2021   iron polysaccharides (NIFEREX) 150 MG capsule Take 1 capsule (150 mg total) by mouth 2 (two) times daily. (Patient taking differently: Take 150 mg by mouth daily.) 60 capsule 3 08/02/2021   Prenatal Vit-Fe Fumarate-FA (PREPLUS) 27-1 MG TABS Take 1 tablet by mouth daily.   08/02/2021   vitamin B-12 (CYANOCOBALAMIN) 1000 MCG tablet Take 1 tablet  (1,000 mcg total) by mouth daily. 30 tablet 3 08/02/2021   Blood Pressure Monitoring (BLOOD PRESSURE KIT) DEVI 1 Device by Does not apply route as needed. (Patient not taking: Reported on 04/21/2021) 1 each 0    hydrOXYzine (VISTARIL) 25 MG capsule Take 1 capsule (25 mg total) by mouth every 8 (eight) hours as needed for anxiety. (Patient not taking: Reported on 07/05/2021) 30 capsule 0 Not Taking     Review of Systems:  All systems reviewed and negative except as stated in HPI  PE: Blood pressure 123/62, pulse 70, temperature 97.9 F (36.6 C), temperature source Oral, resp. rate 16, height _0  (1.626 m), weight 88.2 kg, last menstrual period 11/06/2020. General appearance: alert, cooperative, and no distress Lungs: regular rate and effort Heart: regular rate  Abdomen: soft, non-tender Extremities: Homans sign is negative, no sign of DVT Presentation: cephalic EFM: 165 bpm, mod variability, + accels, no decels Toco: 1-4 Dilation: 6 Effacement (%): 70 Station: -2 Exam by:: jolynn  Prenatal labs: ABO, Rh: --/--/O POS (02/21 0745) Antibody: NEG (02/21 0745) Rubella: <0.90 (08/18 1107) RPR: Non Reactive (12/08 0821)  HBsAg: Negative (08/18 1107)  HIV: Non Reactive (12/08 0821)  GBS: Negative/-- (02/09 1531)  2 hr GTT nml  Prenatal Transfer Tool  Maternal Diabetes: No Genetic Screening: Normal Maternal Ultrasounds/Referrals: Normal Fetal Ultrasounds or other Referrals:  None Maternal Substance Abuse:  Yes:  Type: Marijuana Significant Maternal Medications:  None Significant Maternal Lab Results: Group B Strep negative  Results for orders placed or performed during the hospital encounter of 08/03/21 (from the past 24 hour(s))  CBC   Collection Time: 08/03/21  7:36 AM  Result Value Ref Range   WBC 11.1 (H) 4.0 - 10.5 K/uL   RBC 4.26 3.87 - 5.11 MIL/uL   Hemoglobin 12.6 12.0 - 15.0 g/dL   HCT 38.8 36.0 - 46.0 %   MCV 91.1 80.0 - 100.0 fL   MCH 29.6 26.0 - 34.0 pg   MCHC  32.5 30.0 - 36.0 g/dL   RDW 15.4 11.5 - 15.5 %   Platelets 111 (L) 150 - 400 K/uL   nRBC 0.0 0.0 - 0.2 %  Type and screen   Collection Time: 08/03/21  7:45 AM  Result Value Ref Range   ABO/RH(D) O POS    Antibody Screen NEG    Sample Expiration      08/06/2021,2359 Performed at [facility], [place] [address] [place], [place] [address]   Resp Panel by RT-PCR (Flu A&B, Covid) Nasopharyngeal Swab  Collection Time: 08/03/21  7:47 AM   Specimen: Nasopharyngeal Swab; Nasopharyngeal(NP) swabs in vial transport medium  Result Value Ref Range   SARS Coronavirus 2 by RT PCR NEGATIVE NEGATIVE   Influenza A by PCR NEGATIVE NEGATIVE   Influenza B by PCR NEGATIVE NEGATIVE    Patient Active Problem List   Diagnosis Date Noted   Gestational thrombocytopenia ([place]) 07/22/2021   Anxiety 06/03/2021   Rubella non-immune status, antepartum 03/01/2021   Pap smear abnormality of cervix with LGSIL 02/27/2021   Supervision of low-risk pregnancy 12/28/2020   History of eclampsia 03/12/2020    Assessment: Sharmayne Jablon is a 31 y.o. G3P1011 at 28w4dhere for labor  1. Labor: active 2. FWB: Cat I 3. Pain: labor support w/o meds per pt request 4. GBS: neg   Plan: Admit to LD Expectant mngt  MJulianne Handler CNM  08/03/2021, 9:10 AM

## 2021-08-04 MED ORDER — FUROSEMIDE 20 MG PO TABS: 20.0000 mg | ORAL_TABLET | Freq: Every day | ORAL | 0 refills | Status: DC

## 2021-08-04 MED ORDER — IBUPROFEN 600 MG PO TABS: 600.0000 mg | ORAL_TABLET | Freq: Four times a day (QID) | ORAL | 0 refills | Status: DC | PRN

## 2021-08-04 MED ORDER — ACETAMINOPHEN 325 MG PO TABS: 1000.0000 mg | ORAL_TABLET | Freq: Four times a day (QID) | ORAL | Status: DC | PRN

## 2021-08-04 MED ORDER — MEASLES, MUMPS & RUBELLA VAC IJ SOLR
0.5000 mL | Freq: Once | INTRAMUSCULAR | Status: AC
Start: 2021-08-04 — End: 2021-08-04
  Administered 2021-08-04: 0.5 mL via SUBCUTANEOUS
  Filled 2021-08-04: qty 0.5

## 2021-08-04 NOTE — Lactation Note (Addendum)
This note was copied from a baby's chart. Lactation Consultation Note  Patient Name: Veronica Jones ZYSAY'[id number] Date: 08/04/2021 Reason for consult: Follow-up assessment;MD order (-5% weight loss, ETI infant.) Age:29 hours P2, Mom attempted to latch infant at the breast, infant did not latch and been sleepy , infant was circumcised earlier today. Per mom, infant latched well at  0100 am , 0430 am and again with LC at 1100 am, but infant  not feed since 1100 am , infant has spit up once and been sleepy most of the day. See flow sheet, infant had few feeding by spoon., and mom made few attempts. LC set mom up with DEBP and mom expressed 30 mls of EBM. Infant refused the slow flow bottle nipple ( green)and extra slow flow bottle nipple ( purple), infant was tongue thrusting and milk was spilling out the side of infant's mouth. LC used glove finger and curve tip syringe to feed infant 15 mls of EBM. Mom will call LC to assist with next latch, mom will continue to use DEBP every 3 hours for 15 minutes on initial setting. Parents informed LC they plan to be discharge today and  that infant has Peds appointment in the morning. LC suggested mom and baby stay and work on infant latching at the  breast or taking mom's EBM from bottle tonight.  Per mom, she has DEBP at home. Per mom, previous LC discussed engorgement, warning signs of dehydration and how to know if breastfeeding is going well. LC reviewed discharge community resources: LC online support group, LC hot line and LC outpatient clinic Mom's plan: 1- Mom will continue to breastfeed infant according to feeding cues, 8 to 12+ or more times, will ask RN/LC for latch assistance if infant is not latching at breast. 2- If infant is still not latching mom will offer her pumped EBM. 3- Mom will continue to pump every 3 hours for 15 minutes on initial setting. Maternal Data    Feeding Mother's Current Feeding Choice: Breast Milk  LATCH Score Latch:  Too sleepy or reluctant, no latch achieved, no sucking elicited.  Audible Swallowing: None  Type of Nipple: Everted at rest and after stimulation  Comfort (Breast/Nipple): Soft / non-tender  Hold (Positioning): Assistance needed to correctly position infant at breast and maintain latch.  LATCH Score: 5   Lactation Tools Discussed/Used Tools: Pump;Flanges Flange Size: 24 Breast pump type: Double-Electric Breast Pump Pump Education: Setup, frequency, and cleaning;Milk Storage Reason for Pumping: Infant not been latching well today,  very sleep Pumping frequency: Mom will pump every 3 hours for 15 minutes on inital setting. Pumped volume: 30 mL  Interventions Interventions: Skin to skin;Breast compression;Adjust position;Support pillows;Position options;Expressed milk;Education;Pace feeding;DEBP  Discharge    Consult Status Consult Status: Follow-up Date: 08/05/21 Follow-up type: In-patient    Danelle Earthly 08/04/2021, 5:58 PM

## 2021-08-04 NOTE — Progress Notes (Signed)
Post Partum Day 1 Subjective: Veronica Jones is a 31 y.o. G3P1011 [redacted]w[redacted]d s/p SVD.  She has hx of gestational thrombocytopenia.  No acute events overnight.  Pt denies problems with ambulating, voiding or po intake.  She denies nausea or vomiting.  Pain is well controlled with IB PRN.  She denies HA, vision change and no RUQ.  She has had flatus. Feel the urge to "poop", but feels constipated.  Lochia Minimal.  No other abnormal bleeding. Plan for birth control is  Nexplanon, she desires placement before discharge .  She would like to go home today. Method of Feeding: breastfeeding.    Objective: Blood pressure 113/70, pulse 68, temperature 98.4 F (36.9 C), temperature source Oral, resp. rate 18, height 5\' 4"  (1.626 m), weight 88.2 kg, last menstrual period 11/06/2020, SpO2 98 %.  Physical Exam:  General: alert, cooperative and no distress Lochia:normal flow Chest: CTAB Heart: RRR w/o M/G/R Abdomen: +BS, soft, mild TTP (appropriate) Uterine Fundus: firm, 2 FB below U DVT Evaluation: No evidence of DVT seen on physical exam. Extremities: No edema Skin: no signs of bruising  Recent Labs    08/03/21 0736  HGB 12.6  HCT 38.8    Assessment/Plan: ASSESSMENT: Veronica Jones is a 31 y.o. G3P1011 [redacted]w[redacted]d s/p SVD. -Plan for discharge today. -Contraception: Discussed R/B/A to nexplanon and no questions at this time, prefers over POP's. Plan to place today before discharge.  -Gestational thrombocytopenia: Plts up from 98,000-->111,000.   Breastfeeding support PRN  LOS: 1 day   [facility] Cyril Railey 08/04/2021, 7:38 AM

## 2021-08-04 NOTE — Lactation Note (Signed)
This note was copied from a baby's chart. Lactation Consultation Note  Patient Name: Veronica Jones [id number] Date: 08/04/2021 Reason for consult: Follow-up assessment;1st time breastfeeding;Early term 37-38.6wks Age:29 hours   P2 mother whose infant is now 60 hours old.  This is an ETI at 38+4 weeks.  Mother did not breast feed her first child.  Her current feeding preference is breast.  Mother was hand expressing colostrum in a spoon when I arrived.  She had obtained 5 mls.  Demonstrated feeding with the curved tip syringe and then offered to assist with latching.  Mother receptive.  Baby latched easily in the football hold and, with gentle stimulation, began actively sucking.  Observed him feeding for a total of 12 minutes with flanged lips; occasional swallows noted.  Mother denied pain with feeding.  Parents desire a discharge home today.  Reviewed feeding plan and educated them on cluster feeding.  Asked mother to call her RN/LC for latch assistance as needed.  Worked on positioning, body alignment and support; mother learned quickly.  Father at bedside assisting and supporting mother.  Mother has a DEBP for home use.   Maternal Data Has patient been taught Hand Expression?: Yes Does the patient have breastfeeding experience prior to this delivery?: No  Feeding Mother's Current Feeding Choice: Breast Milk  LATCH Score Latch: Repeated attempts needed to sustain latch, nipple held in mouth throughout feeding, stimulation needed to elicit sucking reflex.  Audible Swallowing: A few with stimulation  Type of Nipple: Everted at rest and after stimulation  Comfort (Breast/Nipple): Soft / non-tender  Hold (Positioning): Assistance needed to correctly position infant at breast and maintain latch.  LATCH Score: 7   Lactation Tools Discussed/Used    Interventions Interventions: Breast feeding basics reviewed;Assisted with latch;Skin to skin;Breast massage;Hand express;Breast  compression;Adjust position;Expressed milk;Position options;Support pillows;Education  Discharge Discharge Education: Engorgement and breast care Pump: Personal  Consult Status Consult Status: Complete Date: 08/04/21 Follow-up type: Call as needed    Veronica Jones 08/04/2021, 9:38 AM

## 2021-08-04 NOTE — Clinical Social Work Maternal (Signed)
°CLINICAL SOCIAL WORK MATERNAL/CHILD NOTE ° °Patient Details  °Name: Veronica Jones °MRN: [id number] °Date of Birth: 10/27/1990 ° °Date:  08/04/2021 ° °Clinical Social Worker Initiating Note:  Avina Eberle, LCSW Date/Time: Initiated:  08/04/21/0945    ° °Child's Name:  Veronica Jones  ° °Biological Parents:  Mother, Father (Seher Laakso 08/28/1992, Sharod Jones 10-29-1990)  ° °Need for Interpreter:  None  ° °Reason for Referral:  Current Substance Use/Substance Use During Pregnancy  , Behavioral Health Concerns  ° °Address:  [address] °[place] [address]  °  °Phone number:  [phone] (home)    ° °Additional phone number:  ° °Household Members/Support Persons (HM/SP):   Household Member/Support Person 1, Household Member/Support Person 2 ° ° °HM/SP Name Relationship DOB or Age  °HM/SP -1 Sharod Jones Significant Other 10-29-1990  °HM/SP -2        °HM/SP -3        °HM/SP -4        °HM/SP -5        °HM/SP -6        °HM/SP -7        °HM/SP -8        ° ° °Natural Supports (not living in the home):  Extended Family, Immediate Family  ° °Professional Supports: None  ° °Employment: Full-time  ° °Type of Work: [employer]  ° °Education:  Some College  ° °Homebound arranged:   ° °Financial Resources:  Medicaid  ° °Other Resources:  Child Support, WIC  ° °Cultural/Religious Considerations Which May Impact Care:   ° °Strengths:  Ability to meet basic needs  , Home prepared for child  , Pediatrician chosen  ° °Psychotropic Medications:   Hydroxyzine    ° °Pediatrician:    [place] area ° °Pediatrician List:  ° °[place]    °[place] [facility] ([address])  °[place]    °[place]    °[place]    °[place]    ° ° °Pediatrician Fax Number:   ° °Risk Factors/Current Problems:  Substance Use    ° °Cognitive State:  Insightful  , Linear Thinking    ° °Mood/Affect:  Comfortable  , Calm  , Interested    ° °CSW Assessment: CSW received consult for hx of THC and Anxiety.   CSW met with MOB to offer support and complete assessment.   ° °CSW met with MOB at bedside and introduced CSW role. CSW observed MOB holding the infant and FOB present at bedside. CSW offered MOB privacy. MOB presented pleasant, calm, and welcomed CSW visit. FOB left to allow MOB privacy. MOB confirmed that the address on file is correct. MOB reported that she and FOB recently moved to the address listed and their daughter Aubree Jones (04-16-2015) is temporarily staying with her parents at ([address] [place] [address]) so that her daughter can continue school in the current district. MOB identified her parents, siblings and FOB family as supports.  MOB reported that she is employed at [employer] and receives both FS/ WIC.  ° °CSW inquired about MOB noted history of anxiety. MOB acknowledges that she has anxiety. MOB reported that she was diagnosed with anxiety in December 2022. MOB reported that her anxiety "got stronger" in November of last year when her brother committed suicide. MOB reported that her anxiety occurs when she visualizes her “brother hanging himself.” MOB reported that she is still grieving his loss. MOB reported that she wants to be supportive   while her family grieves however it can be triggering for her. MOB reported that she sees [facility]BH therapist Jamie McManess which she feels has been helpful in her time of grief. MOB reported she must call to schedule her next appointment. MOB reported she was prescribed Vistaril for her anxiety symptoms but was not interested in taking the medication during her pregnancy. MOB reported in January she is felt an improvement in her mood since she secured housing and prepared for the infant. CSW provided active listening and provided emotional support. CSW offered MOB additional grief resources. CSW discussed MOB [place] (13). CSW inquired about MOB coping strategies. MOB reported that she finds comfort in her daughter, " all my problems just go  away when we are together." MOB reported that she smokes marijuana to be calm and for relaxation. CSW led MOB in exploring healthier coping strategies and provided coping resources. MOB was very appreciative of the rescues and stated that she would share them with her family. CSW discussed PPD. MOB reported that she experienced PPD about six years ago as evidenced by her "pretending to be happy and actually feeling really sad and arguing with my boyfriend.” CSW provided education regarding the baby blues period vs. perinatal mood disorders, discussed treatment and gave resources for mental health follow up if concerns arise.  CSW recommended MOB complete self-evaluation during the postpartum time period using the New Mom Checklist from Postpartum Progress and encouraged MOB to contact a medical professional if symptoms are noted at any time. MOB reported understanding. MOB denied thoughts of harm to self and others.  ° °CSW inquired about MOB substance use during the pregnancy. MOB disclosed that she smoked Marijuana during the pregnancy and the last time she use was about two weeks ago. MOB reported she used because it helps calm her. CSW informed MOB about the hospital drug screen policy and that the infant UDS was positive for THC. MOB reported that she was familiar with the process because she had CPS history six years ago her daughter was positive for THC and Benzodiazepine's. MOB denied using Benzodiazepine's and explained that she was given Xanax instead of Advil by a friend. MOB reported the case is closed and she still has custody of her daughter. MOB informed that CSW will continue to monitor the infant's CDS.   ° °MOB reported she has essential items for the infant including a bassinet where the infant will sleep. CSW provided review of Sudden Infant Death Syndrome (SIDS) precautions. MOB has chosen [facility] for the infant's follow up care and will have transportation. CSW assessed MOB for  additional needs. MOB reported no further needs.  ° °CSW made a report to [place] CPS intake Pam Miller.  ° °CSW identifies no further need for intervention and no barriers to discharge at this time. ° °CSW Plan/Description:  CSW Will Continue to Monitor Umbilical Cord Tissue Drug Screen Results and Make Report if Warranted, Sudden Infant Death Syndrome (SIDS) Education, Perinatal Mood and Anxiety Disorder (PMADs) Education, No Further Intervention Required/No Barriers to Discharge, Child Protective Service Report  , Hospital Drug Screen Policy Information  ° ° °Lewi Drost A Nickalos Petersen, LCSW °08/04/2021, 11:51 AM °

## 2021-08-04 NOTE — Anesthesia Postprocedure Evaluation (Signed)
Anesthesia Post Note  Patient: Veronica Jones  Procedure(s) Performed: AN AD HOC LABOR EPIDURAL     Patient location during evaluation: Mother Baby Anesthesia Type: Epidural Level of consciousness: awake and alert and oriented Pain management: satisfactory to patient Vital Signs Assessment: post-procedure vital signs reviewed and stable Respiratory status: respiratory function stable Cardiovascular status: stable Postop Assessment: no headache, no backache, epidural receding, patient able to bend at knees, no signs of nausea or vomiting, adequate PO intake and able to ambulate Anesthetic complications: no   No notable events documented.  Last Vitals:  Vitals:   08/03/21 2325 08/04/21 0431  BP: 128/71 113/70  Pulse: 67 68  Resp: 17 18  Temp: 36.7 C 36.9 C  SpO2: 100% 98%    Last Pain:  Vitals:   08/04/21 0701  TempSrc:   PainSc: 0-No pain   Pain Goal:                   Steven Veazie

## 2021-08-04 NOTE — Discharge Instructions (Addendum)
BP goal <130/80.   We are sending you home on a short course of lasix, this is a fluid pill to help you pee out extra fluid and keep your blood pressure normal.   If you are feeling lightheaded/dizzy, severe HA not getting better with rest/tylenol/ibuprofen, persistent blurred vision, shortness of breath, any seizure like activity, or chest pain/pressure> please return to the MAU

## 2021-08-06 ENCOUNTER — Inpatient Hospital Stay (HOSPITAL_COMMUNITY): Payer: Medicaid Other

## 2021-08-07 ENCOUNTER — Inpatient Hospital Stay (HOSPITAL_COMMUNITY): Payer: Medicaid Other

## 2021-08-10 ENCOUNTER — Ambulatory Visit (INDEPENDENT_AMBULATORY_CARE_PROVIDER_SITE_OTHER): Payer: Medicaid Other | Admitting: Clinical

## 2021-08-10 DIAGNOSIS — F4322 Adjustment disorder with anxiety: Secondary | ICD-10-CM | POA: Diagnosis not present

## 2021-08-10 DIAGNOSIS — Z658 Other specified problems related to psychosocial circumstances: Secondary | ICD-10-CM

## 2021-08-10 NOTE — BH Specialist Note (Signed)
Integrated Behavioral Health via Telemedicine Visit  08/10/2021 Veronica Jones [id number]  Number of Integrated Behavioral Health Clinician visits: 3- Third Visit  Session Start time: 0915   Session End time: 0943  Total time in minutes: 28   Referring Provider: Mariel Aloe, MD Patient/Family location: Home [facility] Provider location: Center for [facility] at [facility] for [facility]  All persons participating in visit: [name] and [facility] Veronica Jones   Types of Service: Individual psychotherapy and Video visit  I connected with Veronica Jones and/or Veronica Jones's  n/a  via  Telephone or Video Enabled Telemedicine Application  (Video is Caregility application) and verified that I am speaking with the correct person using two identifiers. Discussed confidentiality: Yes   I discussed the limitations of telemedicine and the availability of in person appointments.  Discussed there is a possibility of technology failure and discussed alternative modes of communication if that failure occurs.  I discussed that engaging in this telemedicine visit, they consent to the provision of behavioral healthcare and the services will be billed under their insurance.  Patient and/or legal guardian expressed understanding and consented to Telemedicine visit: Yes   Presenting Concerns: Patient and/or family reports the following symptoms/concerns: Increased anxiety with CPS involvement postpartum (due to brief marijuana use in pregnancy after loss of her brother); pt and baby bonding well, with good support at home.  Duration of problem: Postpartum ; Severity of problem: moderate  Patient and/or Family's Strengths/Protective Factors: Social connections, Concrete supports in place (healthy food, safe environments, etc.), Sense of purpose, and Physical Health (exercise, healthy diet, medication compliance, etc.)  Goals Addressed: Patient will:  Reduce symptoms of: anxiety and  stress   Increase knowledge and/or ability of: stress reduction   Demonstrate ability to: Increase healthy adjustment to current life circumstances  Progress towards Goals: Ongoing  Interventions: Interventions utilized:  Functional Assessment of ADLs, Psychoeducation and/or Health Education, and Supportive Reflection Standardized Assessments completed:  Inocente Salles given in past two weeks  Patient and/or Family Response: Pt agrees with treatment plan  Assessment: Patient currently experiencing Adjustment disorder with anxiety and Psychosocial stress.   Patient may benefit from continued brief therapeutic interventions.  Plan: Follow up with behavioral health clinician on : Two weeks Behavioral recommendations:  -Continue prioritizing healthy self-Jones daily for the next two weeks (regular meals and sleep when baby sleeps); allow family to offer practical support Referral(s): Integrated [employer] (In Clinic)  I discussed the assessment and treatment plan with the patient and/or parent/guardian. They were provided an opportunity to ask questions and all were answered. They agreed with the plan and demonstrated an understanding of the instructions.   They were advised to call back or seek an in-person evaluation if the symptoms worsen or if the condition fails to improve as anticipated.  Rae Lips, LCSW  Depression screen [facility] 2/9 08/02/2021 07/22/2021 04/21/2021 03/26/2021 02/25/2021  Decreased Interest 1 1 0 0 1  Down, Depressed, Hopeless 0 2 0 0 0  PHQ - 2 Score 1 3 0 0 1  Altered sleeping 2 1 1 1 2   Tired, decreased energy 2 1 0 1 2  Change in appetite 2 2 0 0 0  Feeling bad or failure about yourself  0 0 0 0 0  Trouble concentrating 1 0 0 0 0  Moving slowly or fidgety/restless 0 0 0 0 0  Suicidal thoughts 0 0 0 0 0  PHQ-9 Score 8 7 1 2 5   Difficult doing  work/chores - - - - -  Some recent data might be hidden   GAD 7 : Generalized Anxiety Score  08/02/2021 07/22/2021 04/21/2021 03/26/2021  Nervous, Anxious, on Edge 2 2 1  0  Control/stop worrying 0 1 0 0  Worry too much - different things 1 2 0 0  Trouble relaxing 2 1 1  0  Restless 0 1 0 0  Easily annoyed or irritable 1 3 1 1   Afraid - awful might happen 1 2 0 0  Total GAD 7 Score 7 12 3 1     Edinburgh Postnatal Depression Scale Screening Tool 08/04/2021  I have been able to laugh and see the funny side of things. 1  I have looked forward with enjoyment to things. 1  I have blamed myself unnecessarily when things went wrong. 2  I have been anxious or worried for no good reason. 2  I have felt scared or panicky for no good reason. 2  Things have been getting on top of me. 2  I have been so unhappy that I have had difficulty sleeping. 1  I have felt sad or miserable. 0  I have been so unhappy that I have been crying. 2  The thought of harming myself has occurred to me. 0  Edinburgh Postnatal Depression Scale Total 13

## 2021-08-12 NOTE — BH Specialist Note (Signed)
Integrated Behavioral Health via Telemedicine Visit  08/24/2021 Sutten Junes [id number]  Number of Integrated Behavioral Health Clinician visits: 5-Fifth Visit  Session Start time: 0927   Session End time: 0952  Total time in minutes: 25   Referring Provider: Mariel Aloe, MD Patient/Family location: Home [facility] Provider location: [facility] for [facility]  All persons participating in visit: [name] and [facility] Nini Cavan   Types of Service: Individual psychotherapy and Telephone visit  I connected with [name] and/or Jakelin Figg's  n/a  via  Telephone or Video Enabled Telemedicine Application  (Video is Caregility application) and verified that I am speaking with the correct person using two identifiers. Discussed confidentiality: Yes   I discussed the limitations of telemedicine and the availability of in person appointments.  Discussed there is a possibility of technology failure and discussed alternative modes of communication if that failure occurs.  I discussed that engaging in this telemedicine visit, they consent to the provision of behavioral healthcare and the services will be billed under their insurance.  Patient and/or legal guardian expressed understanding and consented to Telemedicine visit: Yes   Presenting Concerns: Patient and/or family reports the following symptoms/concerns: Primary concern is daughter "showing out"; also poor appetite and "weird feeling like tingly feeling inside my body, like anxiety"; mood feels "not good" when pumping. Parents and FOB supportive.  Duration of problem: Perinatal increase; Severity of problem: moderate  Patient and/or Family's Strengths/Protective Factors: Social connections, Concrete supports in place (healthy food, safe environments, etc.), Sense of purpose, and Physical Health (exercise, healthy diet, medication compliance, etc.)  Goals Addressed: Patient will:   Reduce symptoms of: anxiety, depression, and stress   Demonstrate ability to: Increase healthy adjustment to current life circumstances and Increase adequate support systems for patient/family  Progress towards Goals: Ongoing  Interventions: Interventions utilized:  Functional Assessment of ADLs, Link to [employer], and Supportive Reflection Standardized Assessments completed: GAD-7 and PHQ 9  Patient and/or Family Response: Patient agrees with treatment plan.   Assessment: Patient currently experiencing Adjustment disorder with mixed anxious and depressed mood, Grief and Psychosocial stress.   Patient may benefit from continued brief therapeutic interventions.  Plan: Follow up with behavioral health clinician on : Call Brittney Mucha at [phone], as needed. Behavioral recommendations:  -Continue to prioritize healthy self-care daily until postpartum medical visit; continue to accept practical support from family -Consider registering for new mom support group at either [email or web address] or [email or web address] for additional support Referral(s): Integrated [employer] (In Clinic) and [employer]:  new mom support  I discussed the assessment and treatment plan with the patient and/or parent/guardian. They were provided an opportunity to ask questions and all were answered. They agreed with the plan and demonstrated an understanding of the instructions.   They were advised to call back or seek an in-person evaluation if the symptoms worsen or if the condition fails to improve as anticipated.  Rae Lips, LCSW  Depression screen [facility] 2/9 08/24/2021 08/02/2021 07/22/2021 04/21/2021 03/26/2021  Decreased Interest 2 1 1  0 0  Down, Depressed, Hopeless 1 0 2 0 0  PHQ - 2 Score 3 1 3  0 0  Altered sleeping 0 2 1 1 1   Tired, decreased energy 1 2 1  0 1  Change in appetite 0 2 2 0 0  Feeling bad or failure about yourself  0 0 0 0 0  Trouble concentrating 0 1  0 0 0  Moving slowly or fidgety/restless  0 0 0 0 0  Suicidal thoughts 0 0 0 0 0  PHQ-9 Score 4 8 7 1 2   Difficult doing work/chores - - - - -  Some recent data might be hidden   GAD 7 : Generalized Anxiety Score 08/24/2021 08/02/2021 07/22/2021 04/21/2021  Nervous, Anxious, on Edge 1 2 2 1   Control/stop worrying 1 0 1 0  Worry too much - different things 1 1 2  0  Trouble relaxing 1 2 1 1   Restless 0 0 1 0  Easily annoyed or irritable 1 1 3 1   Afraid - awful might happen 1 1 2  0  Total GAD 7 Score 6 7 12  3

## 2021-08-24 ENCOUNTER — Ambulatory Visit (INDEPENDENT_AMBULATORY_CARE_PROVIDER_SITE_OTHER): Payer: Medicaid Other | Admitting: Clinical

## 2021-08-24 DIAGNOSIS — F4322 Adjustment disorder with anxiety: Secondary | ICD-10-CM | POA: Diagnosis not present

## 2021-08-24 DIAGNOSIS — Z658 Other specified problems related to psychosocial circumstances: Secondary | ICD-10-CM

## 2021-08-24 DIAGNOSIS — F4321 Adjustment disorder with depressed mood: Secondary | ICD-10-CM

## 2021-09-14 ENCOUNTER — Telehealth: Payer: Self-pay | Admitting: Obstetrics and Gynecology

## 2021-09-15 ENCOUNTER — Telehealth (INDEPENDENT_AMBULATORY_CARE_PROVIDER_SITE_OTHER): Payer: Medicaid Other | Admitting: Obstetrics and Gynecology

## 2021-09-15 ENCOUNTER — Encounter: Payer: Self-pay | Admitting: Obstetrics and Gynecology

## 2021-09-15 MED ORDER — SERTRALINE HCL 50 MG PO TABS: 50.0000 mg | ORAL_TABLET | Freq: Every day | ORAL | 6 refills | Status: DC

## 2021-09-15 NOTE — Progress Notes (Signed)
? ?Provider location: Center for [employer] [facility] [employer] for Women  ? ?Patient location: Home ? ?I connected withNAME@ on 09/15/21 at  1:15 PM EDT by Mychart Video Encounter and verified that I am speaking with the correct person using two identifiers.     ?  ?I discussed the limitations, risks, security and privacy concerns of performing an evaluation and management service virtually and the availability of in person appointments. I also discussed with the patient that there may be a patient responsible charge related to this service. The patient expressed understanding and agreed to proceed. ? ?Post Partum Visit Note ?Subjective:  ? ?Veronica Jones is a 31 y.o. G42P1011 female who presents for a postpartum visit. She is 6 weeks postpartum following a normal spontaneous vaginal delivery.  I have fully reviewed the prenatal and intrapartum course. The delivery was at 38.4 gestational weeks.  Anesthesia: epidural. Postpartum course has been uncomplicated. Baby is doing well. Baby is feeding by breast. Bleeding no bleeding. Bowel function is normal. Bladder function is normal. Patient is sexually active. Contraception method is condoms. Postpartum depression screening: negative. ? ? ?The pregnancy intention screening data noted above was reviewed. Potential methods of contraception were discussed. The patient elected to proceed with No data recorded. ? ? Edinburgh Postnatal Depression Scale - 09/15/21 1321   ? ?  ? Edinburgh Postnatal Depression Scale:  In the Past 7 Days  ? I have been able to laugh and see the funny side of things. 0   ? I have looked forward with enjoyment to things. 0   ? I have blamed myself unnecessarily when things went wrong. 2   ? I have been anxious or worried for no good reason. 2   ? I have felt scared or panicky for no good reason. 1   ? Things have been getting on top of me. 1   ? I have been so unhappy that I have had difficulty sleeping. 0   ? I have felt sad or miserable. 0   ?  I have been so unhappy that I have been crying. 2   ? The thought of harming myself has occurred to me. 0   ? Edinburgh Postnatal Depression Scale Total 8   ? ?  ?  ? ?  ? ? ?  ? ?Review of Systems ?Pertinent items noted in HPI and remainder of comprehensive ROS otherwise negative. ? ?Objective:  ?LMP 11/06/2020 (Exact Date)     ?General:  Alert, oriented and cooperative. Patient is in no acute distress.  ?Respiratory: Normal respiratory effort, no problems with respiration noted  ?Mental Status: Normal mood and affect. Normal behavior. Normal judgment and thought content.  ?Rest of physical exam deferred due to type of encounter   ?Assessment:  ? ? Normal postpartum exam. ? ?Plan:  ?Essential components of care per ACOG recommendations: ? ?1.  Mood and well being: Patient with negative depression screening today. Reviewed local resources for support. Patient is requesting a prescription for antidepressant as a preventative while being followed by intergrated behavioral health ?- Patient does not use tobacco.  ?- hx of drug use? No   ? ?2. Infant care and feeding:  ?-Patient currently breastmilk feeding? Yes If breastmilk feeding discussed return to work and pumping. If needed, patient was provided letter for work to allow for every 2-3 hr pumping breaks, and to be granted a private location to express breastmilk and refrigerated area to store breastmilk. Reviewed importance of draining breast regularly  to support lactation. ?-Social determinants of health (SDOH) reviewed in EPIC. No concerns ? ?3. Sexuality, contraception and birth spacing ?- Patient does not want a pregnancy in the next year.  Desired family size is 2 children.  ?- Reviewed forms of contraception in tiered fashion. Patient desired oral progesterone-only contraceptive today.   ?- Discussed birth spacing of 18 months ? ?4. Sleep and fatigue ?-Encouraged family/partner/community support of 4 hrs of uninterrupted sleep to help with mood and  fatigue ? ?5. Physical Recovery  ?- Discussed patients delivery and complications ?- Patient had a superficial abrasion, perineal healing reviewed. Patient expressed understanding ?- Patient has urinary incontinence? No  ?- Patient is safe to resume physical and sexual activity ? ?6.  Health Maintenance ?- Last pap smear done 01/2021 and was abnormal with LGSIL  with negative HPV. ?Patient needs to return for colposcopy ? ? ? ? ?I provided 15 minutes of face-to-face time during this encounter. ? ? ? ?Return in 4 weeks (on 10/13/2021). ? ?No future appointments. ? ? ?Catalina Antigua, MD ?Center for [employer], [facility] ? ?

## 2021-09-15 NOTE — Progress Notes (Deleted)
? ?Provider location: Center for [place] at {Blank single:19197::"[facility]"}  ? ?Patient location: Home ? ?I connected withNAME@ on 09/15/21 at  1:15 PM EDT by Mychart Video Encounter and verified that I am speaking with the correct person using two identifiers.     ?  ?I discussed the limitations, risks, security and privacy concerns of performing an evaluation and management service virtually and the availability of in person appointments. I also discussed with the patient that there may be a patient responsible charge related to this service. The patient expressed understanding and agreed to proceed. ? ?Post Partum Visit Note ?Subjective:  ? ?Veronica Jones is a 31 y.o. G70P1011 female who presents for a postpartum visit. She is 6 weeks postpartum following a normal spontaneous vaginal delivery.  I have fully reviewed the prenatal and intrapartum course. The delivery was at 38.4 gestational weeks.  Anesthesia: epidural. Postpartum course has been ***. Baby is doing well***. Baby is feeding by breast. Bleeding changing a maxi pad every 3 hours. Bowel function is normal. Bladder function is normal. Patient is sexually active. Contraception method is oral progesterone-only contraceptive. Postpartum depression screening: {gen negative/positive:315881}. ? ? ?The pregnancy intention screening data noted above was reviewed. Potential methods of contraception were discussed. The patient elected to proceed with No data recorded. ? ? ? ?{Common ambulatory SmartLinks:19316} ? ?Review of Systems ?{ros; complete:30496} ? ?Objective:  ?LMP 11/06/2020 (Exact Date)     ?General:  Alert, oriented and cooperative. Patient is in no acute distress.  ?Respiratory: Normal respiratory effort, no problems with respiration noted  ?Mental Status: Normal mood and affect. Normal behavior. Normal judgment and thought  content.  ?Rest of physical exam deferred due to type of encounter   ?Assessment:  ? ? *** postpartum exam. ? ?Plan:  ?Essential components of care per ACOG recommendations: ? ?1.  Mood and well being: Patient with {gen negative/positive:315881} depression screening today. Reviewed local resources for support.  ?- Patient {Action; does/does not:19097} use tobacco. ***If using tobacco we discussed reduction and for recently cessation risk of relapse ?- hx of drug use? {yes/no:20286}  *** If yes, discussed support systems ? ?2. Infant care and feeding:  ?-Patient currently breastmilk feeding? {yes/no:20286} ***If breastmilk feeding discussed return to work and pumping. If needed, patient was provided letter for work to allow for every 2-3 hr pumping breaks, and to be granted a private location to express breastmilk and refrigerated area to store breastmilk. Reviewed importance of draining breast regularly to support lactation. ?-Social determinants of health (SDOH) reviewed in EPIC. No concerns***The following needs were identified*** ? ?3. Sexuality, contraception and birth spacing ?- Patient {DOES_DOES JZ:4998275 want a pregnancy in the next year.  Desired family size is {NUMBER 1-10:22536} children.  ?- Reviewed forms of contraception in tiered fashion. Patient desired {PLAN CONTRACEPTION:313102} today.   ?- Discussed birth spacing of 18 months ? ?4. Sleep and fatigue ?-Encouraged family/partner/community support of 4 hrs of uninterrupted sleep to help with mood and fatigue ? ?5. Physical Recovery  ?- Discussed patients delivery*** and complications ?- Patient had a *** degree laceration, perineal healing reviewed. Patient expressed understanding ?- Patient has urinary incontinence? {yes/no:20286}*** Patient was referred to pelvic floor PT  ?- Patient {ACTION; IS/IS GI:087931 safe to resume physical and sexual activity ? ?6.  Health Maintenance ?- Last pap smear done *** and was {Normal/abnormal wildcard:19619}  with negative HPV. ?***Mammogram ? ?7. ***Chronic Disease ?- PCP follow up ? ?I provided *** minutes of  face-to-face time during this encounter. ? ? ? ?Return in about 2 weeks (around 09/29/2021) for colposcopy in 2-4 weeks. ? ?Future Appointments  ?Date Time Provider Minor  ?09/15/2021  1:15 PM Gaylin Bulthuis, Vickii Chafe, MD [facility]  ? ? ?Mena Goes, CMA ?Center for [place]  ?

## 2021-10-01 IMAGING — US US OB TRANSVAGINAL
1 series · 15 of 28 positions shown · non-contrast
Comparison: 02/27/2020

CLINICAL DATA: Pregnancy of unknown anatomic location

EXAM:
TRANSVAGINAL OB ULTRASOUND
TECHNIQUE: Transvaginal ultrasound was performed for complete evaluation of the
gestation as well as the maternal uterus, adnexal regions, and
pelvic cul-de-sac.

[Series 1: us ob transvaginal · 59 acquisitions, 15 frames shown]
[im 1/59]
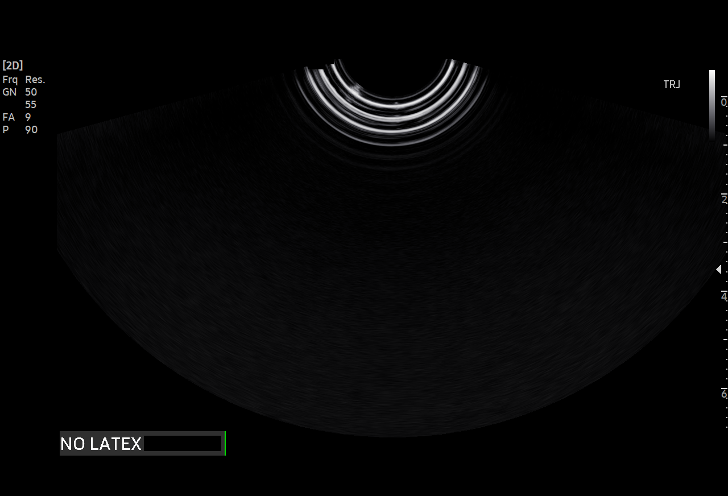
[im 5/59]
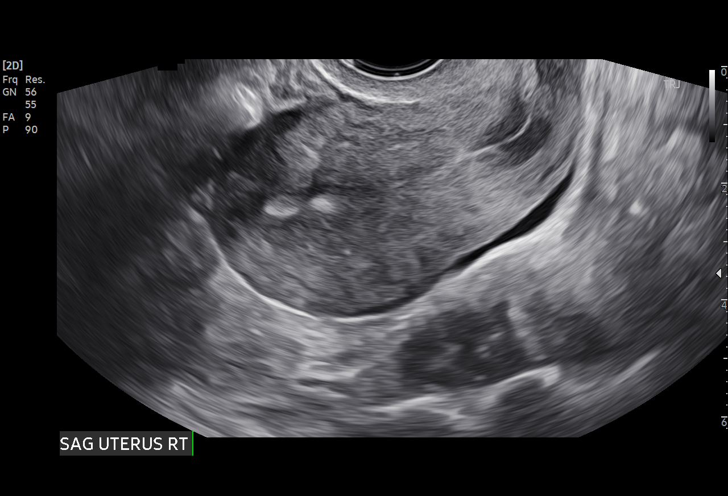
[im 9/59]
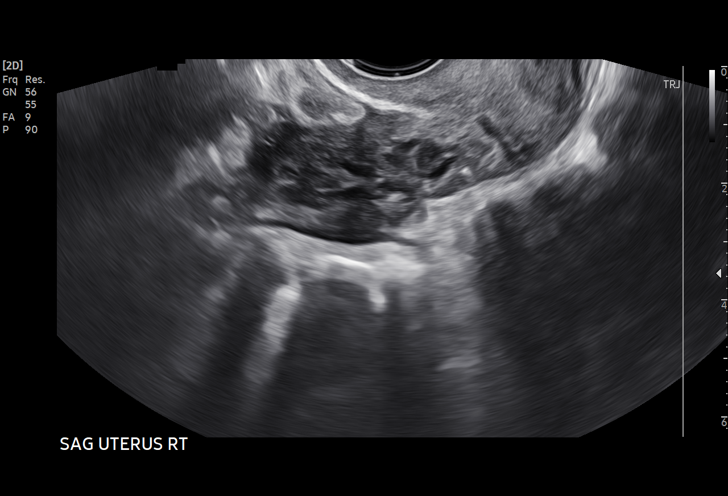
[im 13/59]
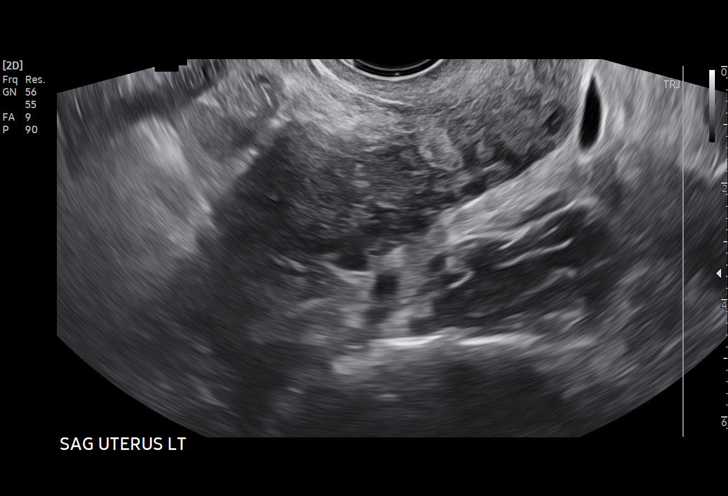
[im 18/59]
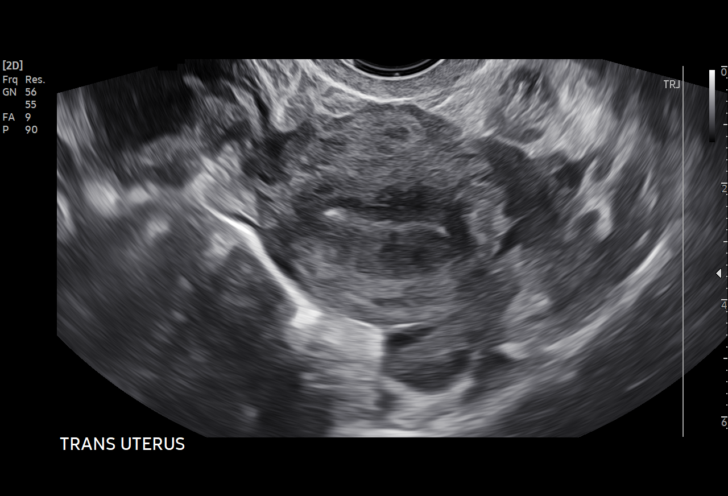
[im 22/59]
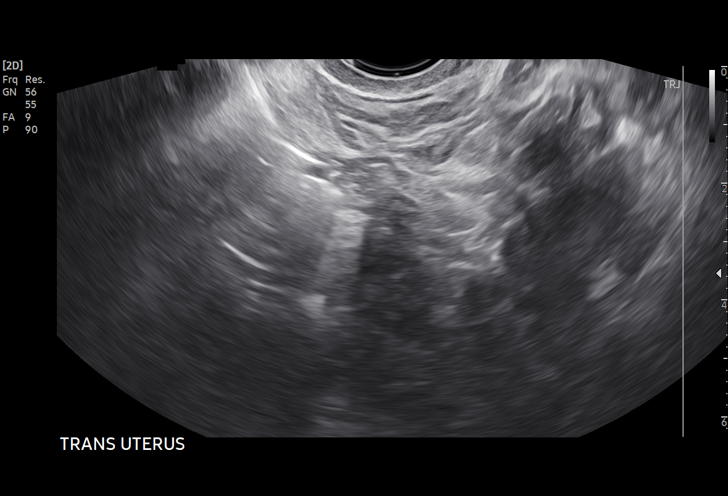
[im 26/59]
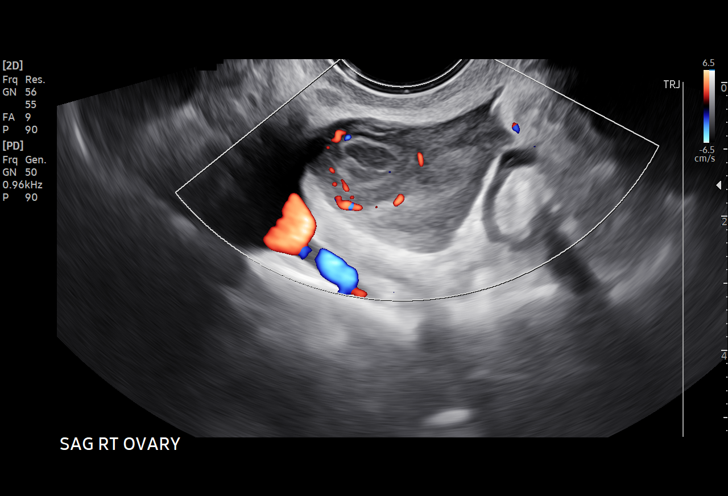
[im 31/59]
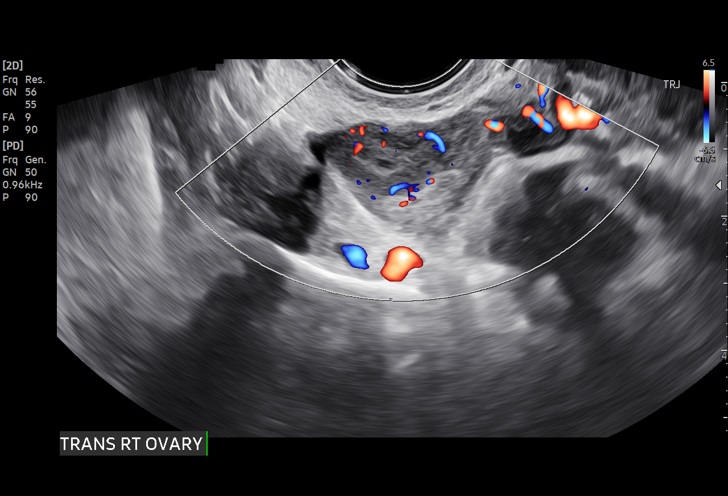
[im 33/59]
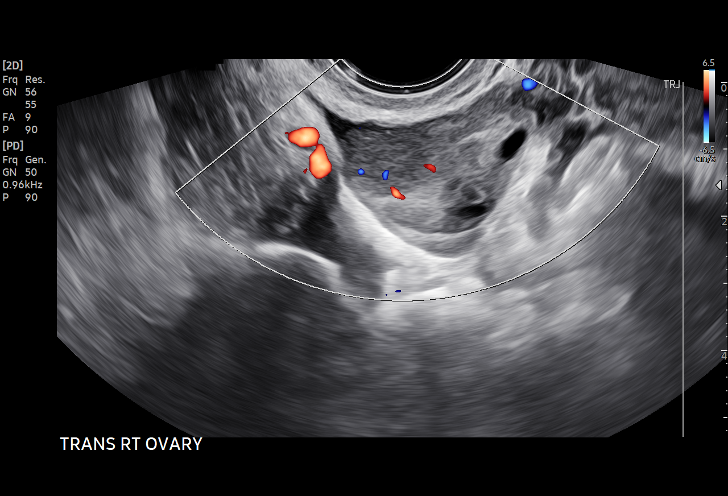
[im 37/59]
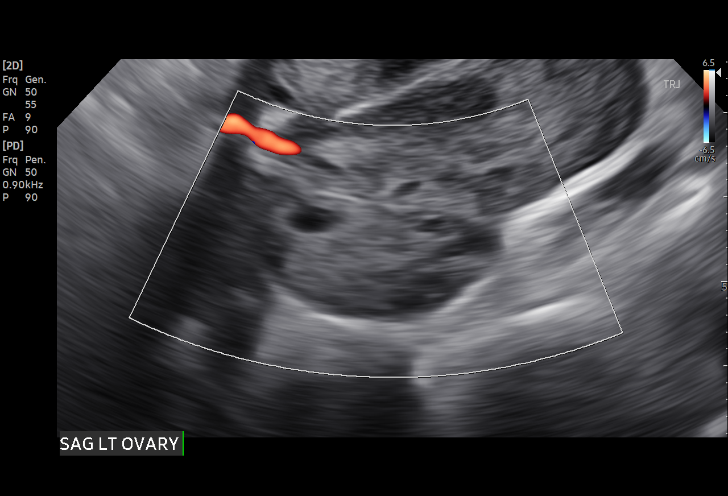
[im 41/59]
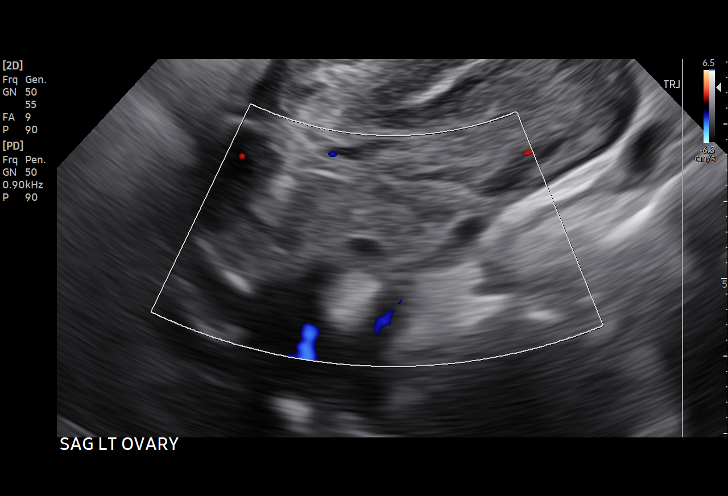
[im 46/59]
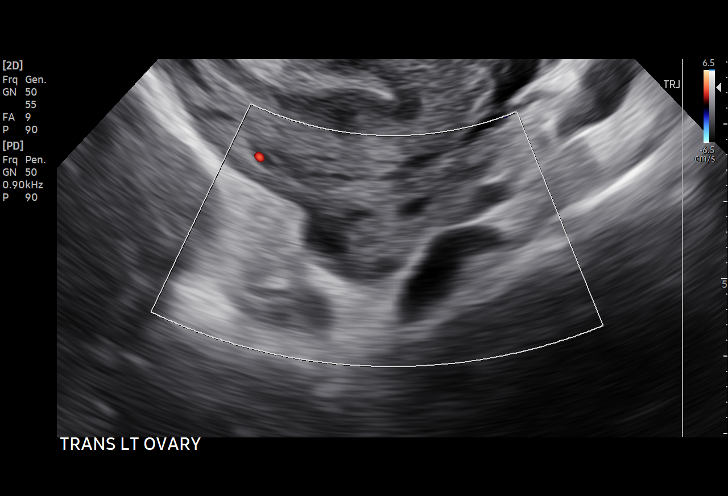
[im 50/59]
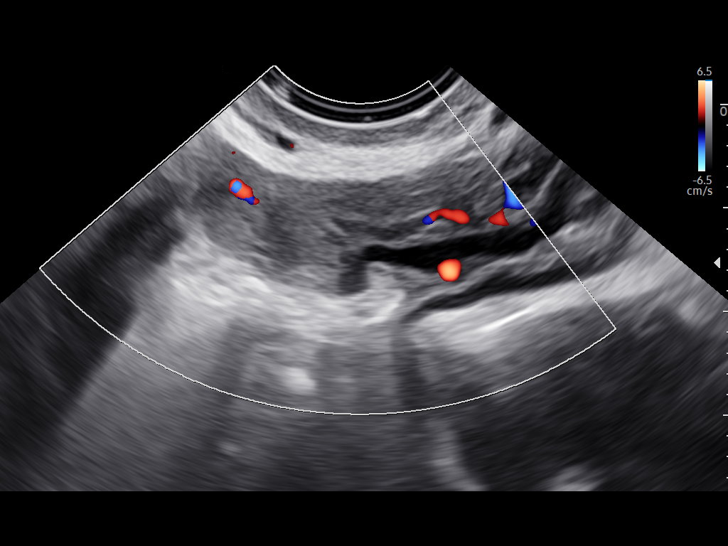
[im 54/59]
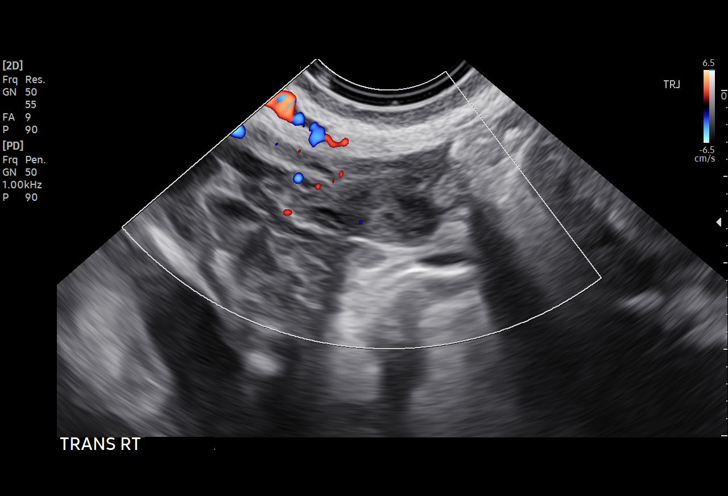
[im 59/59]
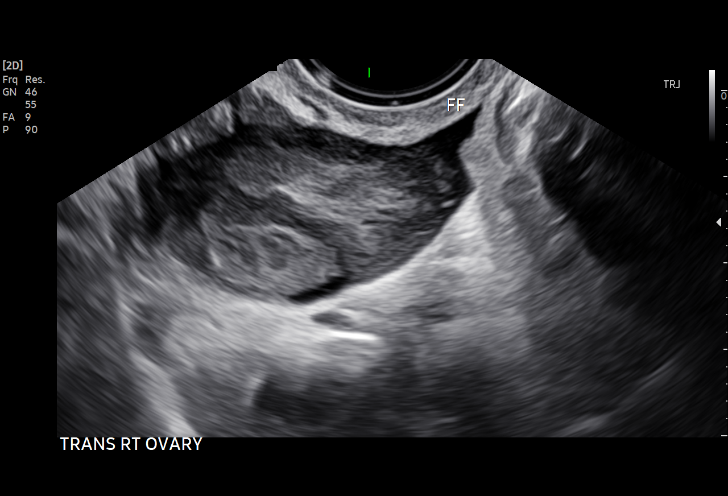

[15 of 28 positions shown; findings below may reference images not displayed]

FINDINGS: Intrauterine gestational sac: Absent

Yolk sac:  N/A

Embryo:  N/A

Cardiac Activity: N/A

Heart Rate: N/A bpm

MSD:   mm    w     d

CRL:     mm    w  d                  US EDC:

Subchorionic hemorrhage:  N/A .

Maternal uterus/adnexae:

Maternal uterus normal appearance with endometrial complex 4 mm
thick, normal.

No uterine mass, gestational sac or endometrial fluid.

RIGHT ovary measures 3.3 x 1.7 x 3.0 cm and contains a small corpus
luteum.

LEFT ovary normal size and morphology, 2.9 x 1.4 x 1.7 cm.

Small amount of nonspecific free pelvic fluid.

Small mass identified within the RIGHT adnexa separate from the
RIGHT ovary measuring 16 x 6 x 13 mm in size, appears to be related
to the fallopian tube.

Finding is suspicious for an early ectopic pregnancy.
IMPRESSION: No intrauterine gestation identified.

16 x 6 x 13 mm diameter nodule seen in RIGHT adnexa separate from
the RIGHT ovary, question along the fallopian tube, suspicious for
an early ectopic pregnancy.

Recommend correlation with quantitative beta HCG.

Critical Value/emergent results were called by telephone at the time
of interpretation on 03/12/2020 at [DATE] to provider MIURA
TORIN TIDWELL, who verbally acknowledged these results.

## 2021-10-04 ENCOUNTER — Encounter: Payer: Self-pay | Admitting: Family Medicine

## 2021-10-30 ENCOUNTER — Emergency Department (HOSPITAL_BASED_OUTPATIENT_CLINIC_OR_DEPARTMENT_OTHER)
Admission: EM | Admit: 2021-10-30 | Discharge: 2021-10-30 | Disposition: A | Discharge status: Home / Self Care | Payer: Medicaid Other | Attending: Emergency Medicine | Admitting: Emergency Medicine

## 2021-10-30 ENCOUNTER — Encounter (HOSPITAL_BASED_OUTPATIENT_CLINIC_OR_DEPARTMENT_OTHER): Payer: Self-pay

## 2021-10-30 ENCOUNTER — Other Ambulatory Visit: Payer: Self-pay

## 2021-10-30 DIAGNOSIS — J069 Acute upper respiratory infection, unspecified: Secondary | ICD-10-CM

## 2021-10-30 DIAGNOSIS — R059 Cough, unspecified: Secondary | ICD-10-CM | POA: Diagnosis present

## 2021-10-30 DIAGNOSIS — Z20822 Contact with and (suspected) exposure to covid-19: Secondary | ICD-10-CM | POA: Diagnosis not present

## 2021-10-30 DIAGNOSIS — J029 Acute pharyngitis, unspecified: Secondary | ICD-10-CM

## 2021-10-30 DIAGNOSIS — B9789 Other viral agents as the cause of diseases classified elsewhere: Secondary | ICD-10-CM | POA: Insufficient documentation

## 2021-10-30 DIAGNOSIS — R000 Tachycardia, unspecified: Secondary | ICD-10-CM | POA: Insufficient documentation

## 2021-10-30 DIAGNOSIS — J028 Acute pharyngitis due to other specified organisms: Secondary | ICD-10-CM | POA: Insufficient documentation

## 2021-10-30 LAB — RESP PANEL BY RT-PCR (FLU A&B, COVID) ARPGX2
Influenza A by PCR: NEGATIVE
Influenza B by PCR: NEGATIVE
SARS Coronavirus 2 by RT PCR: NEGATIVE

## 2021-10-30 LAB — GROUP A STREP BY PCR: Group A Strep by PCR: NOT DETECTED

## 2021-10-30 MED ORDER — IBUPROFEN 200 MG PO TABS
600.0000 mg | ORAL_TABLET | Freq: Once | ORAL | Status: AC
  Administered 2021-10-30: 600 mg via ORAL
  Filled 2021-10-30: qty 1

## 2021-10-30 NOTE — ED Notes (Signed)
PA at the Bedside. ?

## 2021-10-30 NOTE — Discharge Instructions (Addendum)
It was a pleasure taking care of you today!   Your strep, COVID, flu swabs are negative today in the ED.  You may continue taking over-the-counter 600 mg ibuprofen every 6 hours or 1000 mg Tylenol every 6 hours as needed for pain.  Ensure to maintain fluid intake with tea, broth, soup, Pedialyte, Gatorade, water.  You may follow-up with your primary care provider as needed.  You may also take over-the-counter cough and cold medications as needed for your symptoms.  Return to the emergency department for experiencing increasing/worsening trouble breathing, trouble swallowing, decreased fluid intake, excessive vomiting or worsening symptoms.

## 2021-10-30 NOTE — ED Triage Notes (Signed)
Patient here POV from Home.  Endorses Sore Throat and Body Aches since yesterday. States it has progressed since. Family has been ill recently.  No Known Fevers at Home. No Congestion. No Cough.  NAD Noted during Triage. A&Ox4. GCS 15. Ambulatory.

## 2021-10-30 NOTE — ED Provider Notes (Signed)
[facility] EMERGENCY DEPT Provider Note   CSN: [id number] Arrival date & time: 10/30/21  1101     History  Chief Complaint  Patient presents with   Sore Throat     Veronica Jones is a 31 y.o. female who presents to the Emergency Department complaining of sore throat onset yesterday. Has had sick contacts at home with similar symptoms, her significant other was diagnosed with pharyngitis and her child just recently got over strep throat. She has associated generalized body aches, cough. Has tried ibuprofen, muscle relaxer without relief. Warm bath aided with her symptoms. Denies fever, nasal congestion, trouble swallowing, decreased PO intake. Allergic to PCN and amoxil, reaction is hives.      The history is provided by the patient. No language interpreter was used.      Home Medications Prior to Admission medications   Medication Sig Start Date End Date Taking? Authorizing Provider  acetaminophen (TYLENOL) 325 MG tablet Take 3 tablets (975 mg total) by mouth every 6 (six) hours as needed (for pain scale < 4). Patient not taking: Reported on 09/15/2021 08/04/21   Beard, Samantha N, DO  Blood Pressure Monitoring (BLOOD PRESSURE KIT) DEVI 1 Device by Does not apply route as needed. Patient not taking: Reported on 04/21/2021 12/28/20   Bass, Lawrence A, MD  furosemide (LASIX) 20 MG tablet Take 1 tablet (20 mg total) by mouth daily for 4 days. 08/04/21 08/08/21  Beard, Samantha N, DO  ibuprofen (ADVIL) 600 MG tablet Take 1 tablet (600 mg total) by mouth every 6 (six) hours as needed. Patient not taking: Reported on 09/15/2021 08/04/21   Beard, Samantha N, DO  iron polysaccharides (NIFEREX) 150 MG capsule Take 1 capsule (150 mg total) by mouth 2 (two) times daily. Patient not taking: Reported on 09/15/2021 06/30/21   Kale, Gautam Kishore, MD  Prenatal Vit-Fe Fumarate-FA (PREPLUS) 27-1 MG TABS Take 1 tablet by mouth daily.    [provider]  sertraline (ZOLOFT) 50 MG tablet  Take 1 tablet (50 mg total) by mouth daily. 09/15/21   Constant, Peggy, MD  vitamin B-12 (CYANOCOBALAMIN) 1000 MCG tablet Take 1 tablet (1,000 mcg total) by mouth daily. Patient not taking: Reported on 09/15/2021 06/22/21   Kale, Gautam Kishore, MD      Allergies    Amoxicillin and Penicillins    Review of Systems   Review of Systems  Constitutional:  Negative for appetite change, chills and fever.  HENT:  Positive for sore throat. Negative for congestion, rhinorrhea and trouble swallowing.   Respiratory:  Positive for cough.   All other systems reviewed and are negative.  Physical Exam Updated Vital Signs BP 133/86 (BP Location: Right Arm)   Pulse (!) 104   Temp 99.9 F (37.7 C)   Resp 14   Ht 5' 4" (1.626 m)   Wt 88.2 kg   LMP 11/06/2020 (Exact Date)   SpO2 98%   Breastfeeding Unknown   BMI 33.38 kg/m  Physical Exam Vitals and nursing note reviewed.  Constitutional:      General: She is not in acute distress.    Appearance: She is not diaphoretic.  HENT:     Head: Normocephalic and atraumatic.     Mouth/Throat:     Mouth: Mucous membranes are moist.     Pharynx: Oropharynx is clear. Uvula midline. No oropharyngeal exudate, posterior oropharyngeal erythema or uvula swelling.     Tonsils: No tonsillar exudate.     Comments: Uvula midline without swelling.  No   posterior pharyngeal erythema or tonsillar exudate. Eyes:     General: No scleral icterus.    Conjunctiva/sclera: Conjunctivae normal.  Cardiovascular:     Rate and Rhythm: Normal rate and regular rhythm.     Pulses: Normal pulses.     Heart sounds: Normal heart sounds.  Pulmonary:     Effort: Pulmonary effort is normal. No respiratory distress.     Breath sounds: Normal breath sounds. No wheezing.  Abdominal:     General: Bowel sounds are normal.     Palpations: Abdomen is soft. There is no mass.     Tenderness: There is no abdominal tenderness. There is no guarding or rebound.  Musculoskeletal:         General: Normal range of motion.     Cervical back: Normal range of motion and neck supple.  Skin:    General: Skin is warm and dry.  Neurological:     Mental Status: She is alert.  Psychiatric:        Behavior: Behavior normal.    ED Results / Procedures / Treatments   Labs (all labs ordered are listed, but only abnormal results are displayed) Labs Reviewed  GROUP A STREP BY PCR  RESP PANEL BY RT-PCR (FLU A&B, COVID) [id number]    EKG None  Radiology No results found.  Procedures Procedures    Medications Ordered in ED Medications  ibuprofen (ADVIL) tablet 600 mg (600 mg Oral Given 10/30/21 1224)    ED Course/ Medical Decision Making/ A&P                           Medical Decision Making  Pt presents with sore throat and generalized body aches onset yesterday.  Has sick contacts at home with similar symptoms.  Vital signs, patient afebrile, slightly tachycardic.  On exam patient with uvula midline without swelling, no posterior pharyngeal erythema or exudate noted.  No acute cardiovascular or respiratory exam findings.  Differential diagnosis includes viral pharyngitis, strep pharyngitis, COVID, flu, viral URI with cough, pneumonia.    Labs:  I ordered, and personally interpreted labs.  The pertinent results include:   COVID, flu, strep swab negative   Medications:  I ordered medication including ibuprofen for symptom management Reevaluation of the patient after these medicines and interventions, I reevaluated the patient and found that they have improved I have reviewed the patients home medicines and have made adjustments as needed   Disposition: Presentation suspicious for viral pharyngitis and viral URI with cough.  Doubt COVID, flu, strep pharyngitis, pneumonia at this time. After consideration of the diagnostic results and the patients response to treatment, I feel that the patient would benefit from Discharge home. Supportive care measures and strict return  precautions discussed with patient at bedside. Pt acknowledges and verbalizes understanding. Pt appears safe for discharge. Follow up as indicated in discharge paperwork.    This chart was dictated using voice recognition software, Dragon. Despite the best efforts of this provider to proofread and correct errors, errors may still occur which can change documentation meaning.  Final Clinical Impression(s) / ED Diagnoses Final diagnoses:  Viral pharyngitis  Viral URI with cough    Rx / DC Orders ED Discharge Orders     None         ,  A, PA-C 10/30/21 1239    Dykstra, Richard S, MD 10/31/21 1011  

## 2021-10-30 NOTE — ED Notes (Signed)
RN provided AVS using Teachback Method. Patient verbalizes understanding of Discharge Instructions. Opportunity for Questioning and Answers were provided by RN. Patient Discharged from ED ambulatory to Home with Family. ? ?

## 2022-06-13 HISTORY — PX: FINGER SURGERY: SHX640

## 2022-06-27 IMAGING — US US OB < 14 WEEKS - US OB TV
1 series · 15 of 28 positions shown · non-contrast
Comparison: None.

CLINICAL DATA: Vaginal bleeding.  Pregnant patient.

EXAM:
OBSTETRIC <14 WK US AND TRANSVAGINAL OB US
TECHNIQUE: Both transabdominal and transvaginal ultrasound examinations were
performed for complete evaluation of the gestation as well as the
maternal uterus, adnexal regions, and pelvic cul-de-sac.
Transvaginal technique was performed to assess early pregnancy.

[Series 1: us ob < 14 weeks - us ob tv · 15 of 55 slices shown]
[im 1/55]
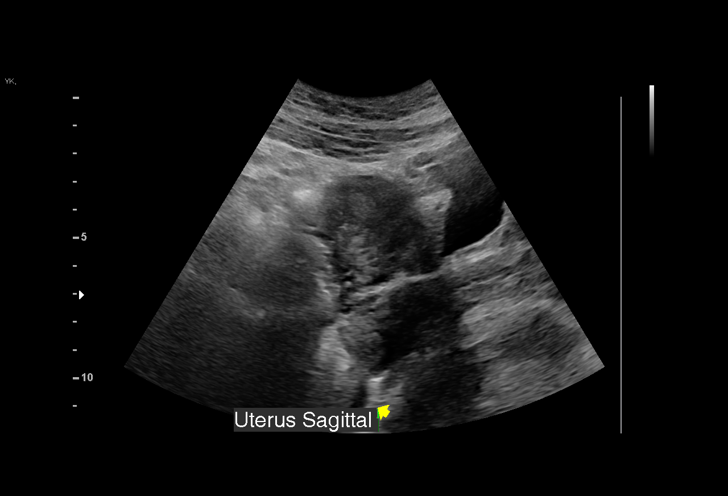
[im 5/55]
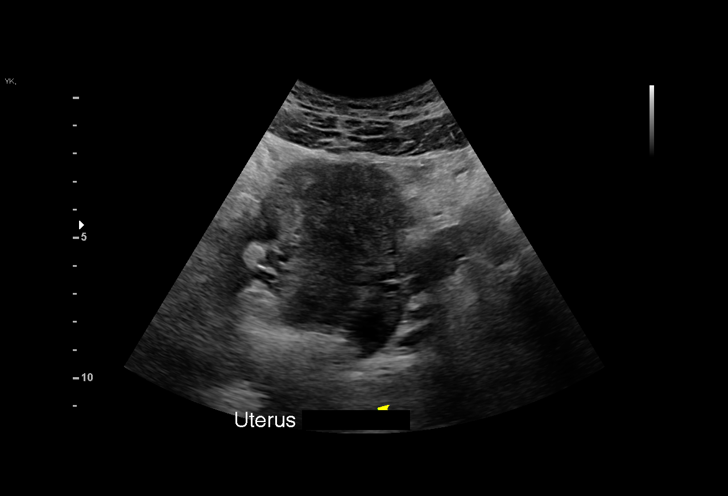
[im 9/55]
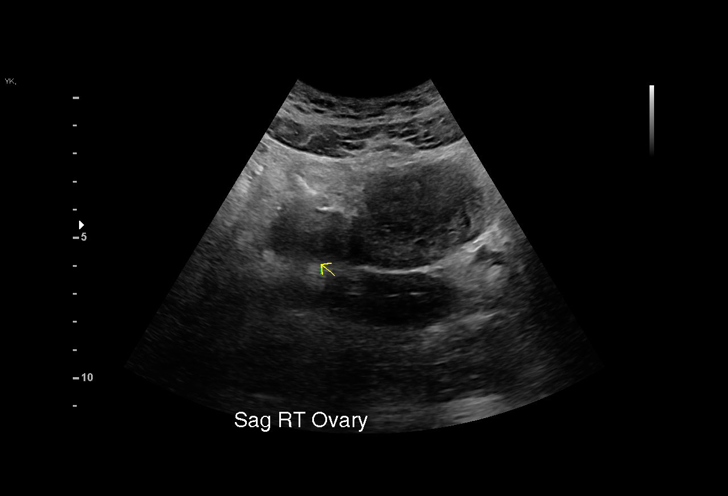
[im 13/55]
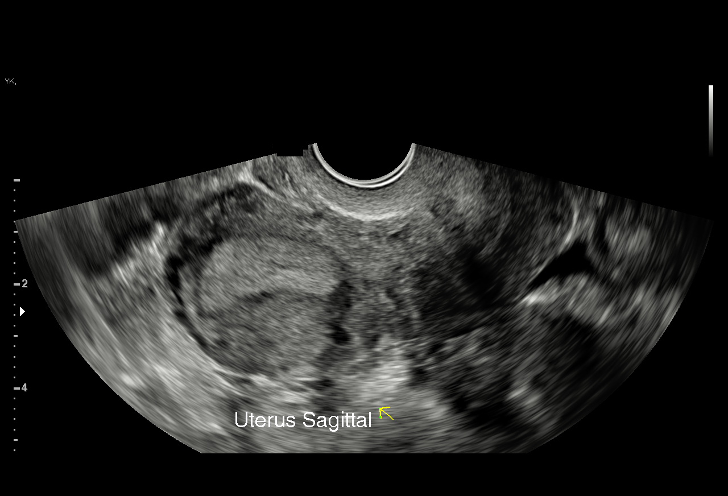
[im 17/55]
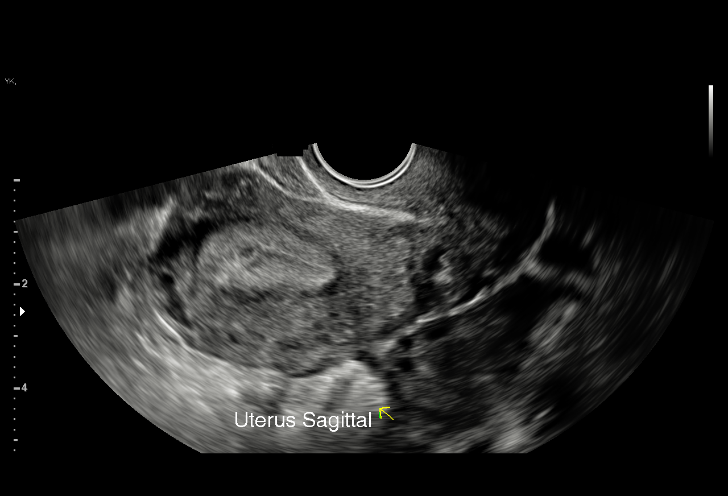
[im 21/55]
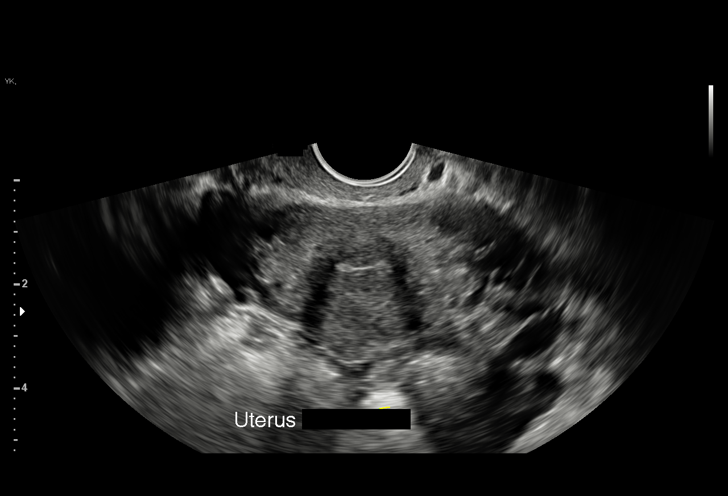
[im 25/55]
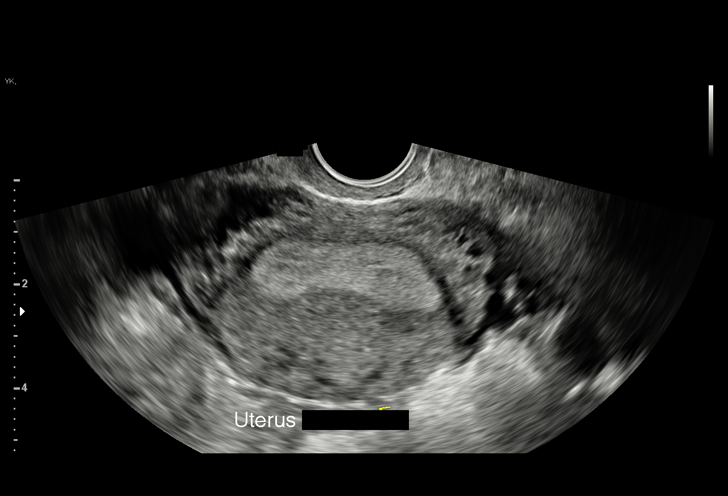
[im 29/55]
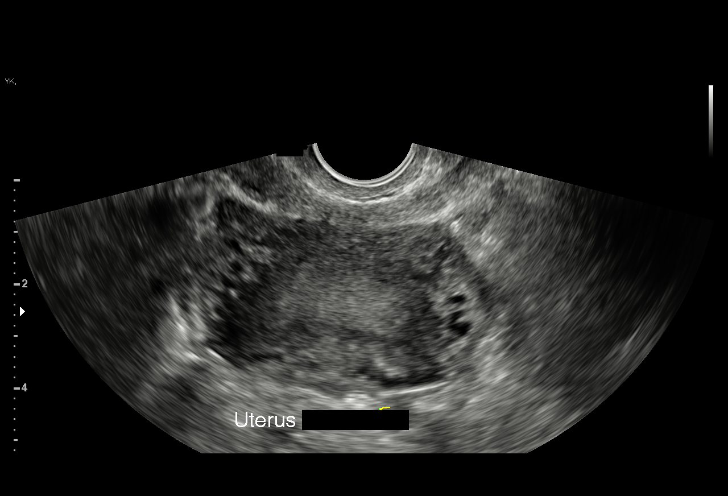
[im 31/55]
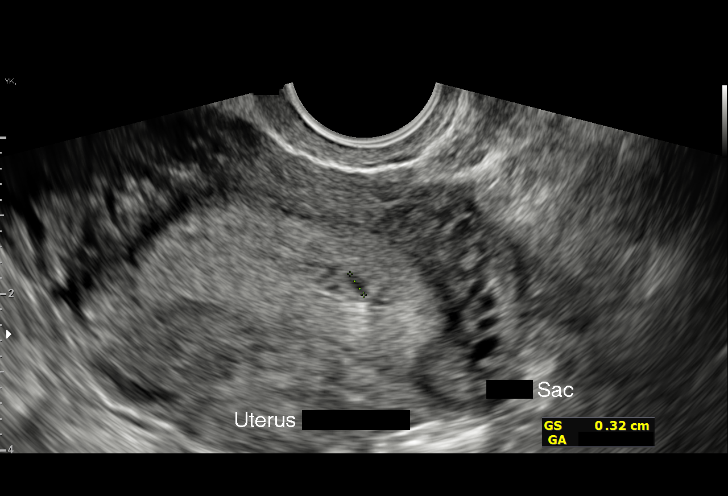
[im 35/55]
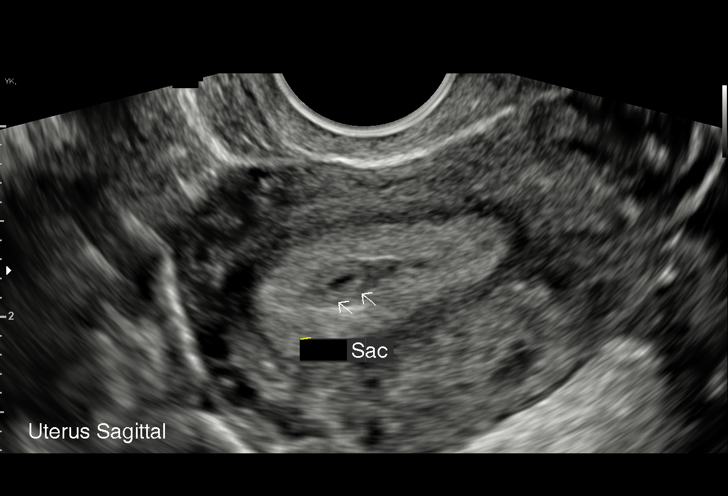
[im 39/55]
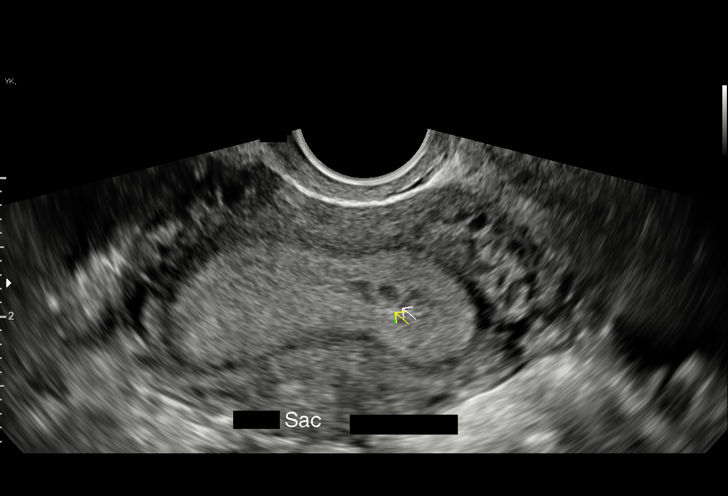
[im 43/55]
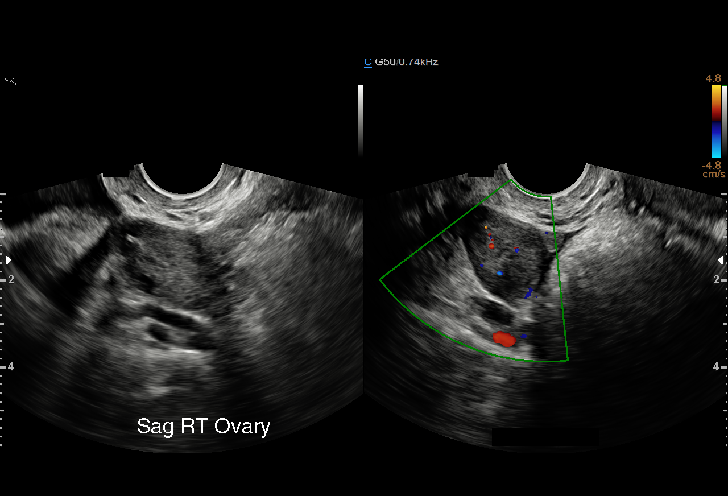
[im 47/55]
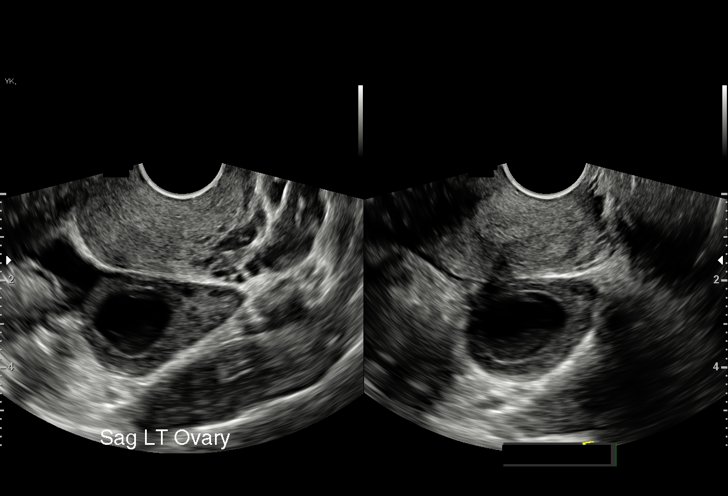
[im 51/55]
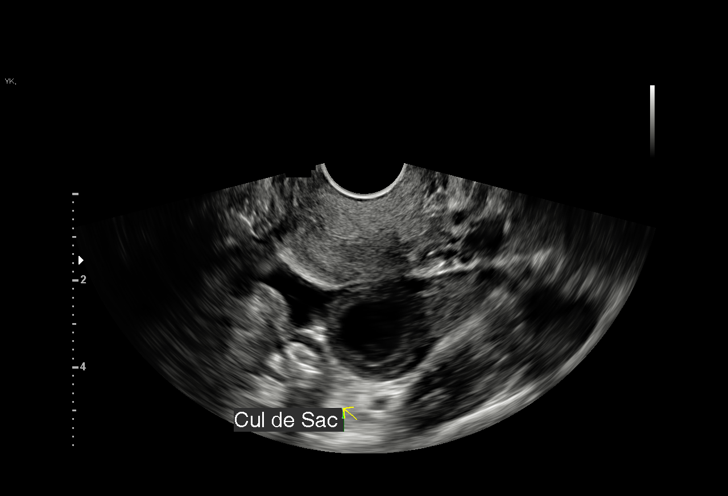
[im 55/55]
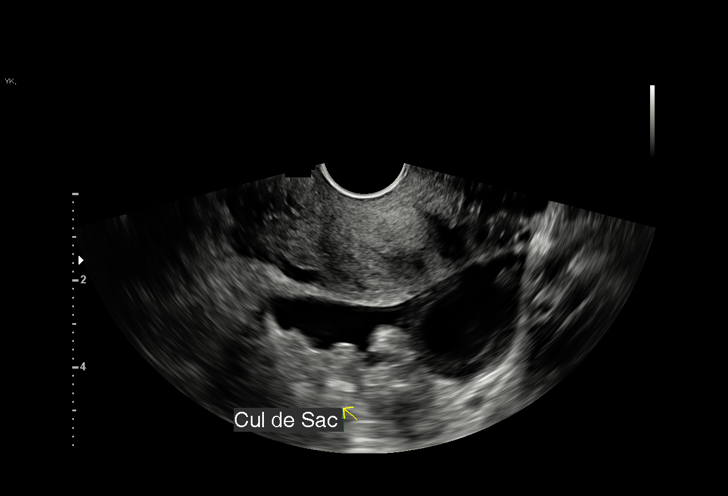

[15 of 28 positions shown; findings below may reference images not displayed]

FINDINGS: Intrauterine gestational sac: The endometrium is thickened. Within
the thickened endometrium is a cluster of hypo echogenic regions, 1
of which is rounded.

Yolk sac:  Not Visualized.

Embryo:  Not Visualized.

MSD: 2.9 mm mm   4 w   6 d

CRL:    mm    w    d                  US EDC:

Subchorionic hemorrhage:  None visualized.

Maternal uterus/adnexae: The ovaries are normal. A corpus luteum
cyst is seen on the left. Trace fluid in the cul-de-sac.
IMPRESSION: 1. There is a cluster of hypoechoic regions, 1 of which is rounded,
within the thickened endometrium. This finding is nonspecific. It is
unclear whether the rounded region of hypoechogenicity represents a
very early gestational sac. No definitive IUP identified. The lack
of an IUP in a pregnant patient could be seen with early pregnancy,
recent miscarriage, or ectopic pregnancy. Recommend clinical
correlation and close follow-up.
2. No other significant abnormalities.

## 2022-10-13 ENCOUNTER — Encounter: Payer: Self-pay | Admitting: Family Medicine

## 2022-10-13 ENCOUNTER — Ambulatory Visit (INDEPENDENT_AMBULATORY_CARE_PROVIDER_SITE_OTHER): Payer: Medicaid Other | Admitting: Family Medicine

## 2022-10-13 ENCOUNTER — Other Ambulatory Visit: Payer: Self-pay

## 2022-10-13 ENCOUNTER — Other Ambulatory Visit (HOSPITAL_COMMUNITY)
Admission: RE | Admit: 2022-10-13 | Discharge: 2022-10-13 | Disposition: A | Discharge status: Home / Self Care | Payer: Medicaid Other | Source: Ambulatory Visit | Attending: Family Medicine | Admitting: Family Medicine

## 2022-10-13 VITALS — BP 120/81 | HR 78 | Ht 64.0 in | Wt 187.7 lb

## 2022-10-13 DIAGNOSIS — R87612 Low grade squamous intraepithelial lesion on cytologic smear of cervix (LGSIL): Secondary | ICD-10-CM | POA: Diagnosis not present

## 2022-10-13 DIAGNOSIS — Z124 Encounter for screening for malignant neoplasm of cervix: Secondary | ICD-10-CM

## 2022-10-13 DIAGNOSIS — N898 Other specified noninflammatory disorders of vagina: Secondary | ICD-10-CM | POA: Insufficient documentation

## 2022-10-13 NOTE — Progress Notes (Signed)
GYNECOLOGY OFFICE VISIT NOTE  History:   Veronica Jones is a 32 y.o. G3P1011 here today for abnormal vaginal discharge. In the last  few months, she has noticed increased pain prior to her periods, periods have been a bit heavier. And she has been particularly bothered by a foul smell of the discharge at the end of her period. Has also experienced some itching and irritation. Has had some pain with intercourse, but more related to dryness. She also feels her symptoms are worse with her BF drinks beer. She has only one sexual partner, and as far she knows, he has no other partners.  Last pap  smear was in 01/2021, noted LGSIL. Has been recommended for post partum colposcopy, but did not get it.   Past Medical History:  Diagnosis Date   Chlamydia 2013   Hx of trichomoniasis    Postpartum eclampsia 04/19/2015    Past Surgical History:  Procedure Laterality Date   NO PAST SURGERIES      The following portions of the patient's history were reviewed and updated as appropriate: allergies, current medications, past family history, past medical history, past social history, past surgical history and problem list.   Health Maintenance:  last pap smear was LGSIL back in 2022, did not follow up for colposcopy as recommended.   Review of Systems:  Pertinent items noted in HPI and remainder of comprehensive ROS otherwise negative.  Physical Exam:  BP 120/81   Pulse 78   Ht 5\' 4"  (1.626 m)   Wt 187 lb 11.2 oz (85.1 kg)   LMP 09/26/2022 (Exact Date)   BMI 32.22 kg/m  CONSTITUTIONAL: Well-developed, well-nourished female in no acute distress.  HEENT:  Normocephalic, atraumatic. External right and left ear normal. No scleral icterus.  NECK: Normal range of motion, supple, no masses noted on observation SKIN: No rash noted. Not diaphoretic. No erythema. No pallor. MUSCULOSKELETAL: Normal range of motion. No edema noted. NEUROLOGIC: Alert and oriented to person, place, and time. Normal muscle tone  coordination. No cranial nerve deficit noted. PSYCHIATRIC: Normal mood and affect. Normal behavior. Normal judgment and thought content. CARDIOVASCULAR: Normal heart rate noted RESPIRATORY: Effort and breath sounds normal, no problems with respiration noted ABDOMEN: No masses noted. No other overt distention noted.   PELVIC: Normal appearing external genitalia; normal urethral meatus; normal appearing vaginal mucosa and cervix. Copious amount of thick white cottage cheese-like discharge noted in vagina.  Normal uterine size, no other palpable masses, no uterine or adnexal tenderness. Performed in the presence of a chaperone  Labs and Imaging No results found for this or any previous visit (from the past 168 hour(s)). No results found.    Assessment and Plan:    1. Cervical cancer screening 3. Pap smear abnormality of cervix with LGSIL Pap smear obtained today. Informed patient of the great importance of following up in results come back abnormal. - Cytology - PAP( [facility])  2. Vaginal odor Vaginal odor and discharge; suspect vaginal candidiasis. Swab samples collected. Will treat based on results.  - Cervicovaginal ancillary only( [facility])  Routine preventative health maintenance measures emphasized. Please refer to After Visit Summary for other counseling recommendations.   No follow-ups on file.    I spent 20 minutes dedicated to the care of this patient including pre-visit review of records, face to face time with the patient discussing her conditions and treatments and post visit orders.  Sheppard Evens MD MPH OB Fellow, Faculty Practice [facility], Center for [facility]  [facility] 10/13/2022

## 2022-10-14 LAB — CERVICOVAGINAL ANCILLARY ONLY
Bacterial Vaginitis (gardnerella): POSITIVE — AB
Candida Glabrata: NEGATIVE
Candida Vaginitis: POSITIVE — AB
Chlamydia: NEGATIVE
Comment: NEGATIVE
Comment: NEGATIVE
Comment: NEGATIVE
Comment: NEGATIVE
Comment: NEGATIVE
Comment: NORMAL
Neisseria Gonorrhea: NEGATIVE
Trichomonas: NEGATIVE

## 2022-10-16 ENCOUNTER — Other Ambulatory Visit: Payer: Self-pay | Admitting: Family Medicine

## 2022-10-16 DIAGNOSIS — B3731 Acute candidiasis of vulva and vagina: Secondary | ICD-10-CM

## 2022-10-16 DIAGNOSIS — B9689 Other specified bacterial agents as the cause of diseases classified elsewhere: Secondary | ICD-10-CM

## 2022-10-16 MED ORDER — TERCONAZOLE 0.4 % VA CREA: 1.0000 | TOPICAL_CREAM | Freq: Every day | VAGINAL | 0 refills | Status: DC

## 2022-10-16 MED ORDER — METRONIDAZOLE 500 MG PO TABS
500.0000 mg | ORAL_TABLET | Freq: Two times a day (BID) | ORAL | 0 refills | Status: AC
Start: 2022-10-16 — End: 2022-10-23

## 2022-10-18 LAB — CYTOLOGY - PAP: Diagnosis: NEGATIVE

## 2023-02-26 ENCOUNTER — Inpatient Hospital Stay (HOSPITAL_COMMUNITY)
Admission: EM | Admit: 2023-02-26 | Discharge: 2023-02-26 | DRG: 158 | Disposition: A | Discharge status: Other Acute Inpatient Hospital | Payer: Medicaid Other | Attending: General Surgery | Admitting: General Surgery

## 2023-02-26 ENCOUNTER — Emergency Department (HOSPITAL_COMMUNITY): Payer: Medicaid Other

## 2023-02-26 DIAGNOSIS — S62511B Displaced fracture of proximal phalanx of right thumb, initial encounter for open fracture: Secondary | ICD-10-CM

## 2023-02-26 DIAGNOSIS — S02609B Fracture of mandible, unspecified, initial encounter for open fracture: Secondary | ICD-10-CM

## 2023-02-26 DIAGNOSIS — S02609A Fracture of mandible, unspecified, initial encounter for closed fracture: Secondary | ICD-10-CM | POA: Diagnosis present

## 2023-02-26 DIAGNOSIS — W3400XA Accidental discharge from unspecified firearms or gun, initial encounter: Principal | ICD-10-CM

## 2023-02-26 DIAGNOSIS — R519 Headache, unspecified: Secondary | ICD-10-CM | POA: Diagnosis present

## 2023-02-26 DIAGNOSIS — Z23 Encounter for immunization: Secondary | ICD-10-CM

## 2023-02-26 DIAGNOSIS — Y9289 Other specified places as the place of occurrence of the external cause: Secondary | ICD-10-CM | POA: Diagnosis not present

## 2023-02-26 DIAGNOSIS — S02652B Fracture of angle of left mandible, initial encounter for open fracture: Principal | ICD-10-CM | POA: Diagnosis present

## 2023-02-26 DIAGNOSIS — D649 Anemia, unspecified: Secondary | ICD-10-CM | POA: Diagnosis present

## 2023-02-26 DIAGNOSIS — S02602B Fracture of unspecified part of body of left mandible, initial encounter for open fracture: Secondary | ICD-10-CM | POA: Diagnosis present

## 2023-02-26 DIAGNOSIS — S02642B Fracture of ramus of left mandible, initial encounter for open fracture: Secondary | ICD-10-CM | POA: Diagnosis present

## 2023-02-26 DIAGNOSIS — Y249XXA Unspecified firearm discharge, undetermined intent, initial encounter: Secondary | ICD-10-CM | POA: Insufficient documentation

## 2023-02-26 HISTORY — DX: Fracture of mandible, unspecified, initial encounter for closed fracture: S02.609A

## 2023-02-26 LAB — CBC WITH DIFFERENTIAL/PLATELET
Abs Immature Granulocytes: 0.01 10*3/uL (ref 0.00–0.07)
Basophils Absolute: 0 10*3/uL (ref 0.0–0.1)
Basophils Relative: 0 %
Eosinophils Absolute: 0.2 10*3/uL (ref 0.0–0.5)
Eosinophils Relative: 2 %
HCT: 35.7 % — ABNORMAL LOW (ref 36.0–46.0)
Hemoglobin: 11.5 g/dL — ABNORMAL LOW (ref 12.0–15.0)
Immature Granulocytes: 0 %
Lymphocytes Relative: 53 %
Lymphs Abs: 4.8 10*3/uL — ABNORMAL HIGH (ref 0.7–4.0)
MCH: 29.4 pg (ref 26.0–34.0)
MCHC: 32.2 g/dL (ref 30.0–36.0)
MCV: 91.3 fL (ref 80.0–100.0)
Monocytes Absolute: 0.9 10*3/uL (ref 0.1–1.0)
Monocytes Relative: 10 %
Neutro Abs: 3.2 10*3/uL (ref 1.7–7.7)
Neutrophils Relative %: 35 %
Platelets: 166 10*3/uL (ref 150–400)
RBC: 3.91 MIL/uL (ref 3.87–5.11)
RDW: 13.2 % (ref 11.5–15.5)
WBC: 9.1 10*3/uL (ref 4.0–10.5)
nRBC: 0 % (ref 0.0–0.2)

## 2023-02-26 LAB — COMPREHENSIVE METABOLIC PANEL
ALT: 24 U/L (ref 0–44)
AST: 28 U/L (ref 15–41)
Albumin: 3.8 g/dL (ref 3.5–5.0)
Alkaline Phosphatase: 45 U/L (ref 38–126)
Anion gap: 20 — ABNORMAL HIGH (ref 5–15)
BUN: 14 mg/dL (ref 6–20)
CO2: 14 mmol/L — ABNORMAL LOW (ref 22–32)
Calcium: 9 mg/dL (ref 8.9–10.3)
Chloride: 103 mmol/L (ref 98–111)
Creatinine, Ser: 0.88 mg/dL (ref 0.44–1.00)
GFR, Estimated: >60 mL/min (ref >60)
Glucose, Bld: 79 mg/dL (ref 70–99)
Potassium: 2.9 mmol/L — ABNORMAL LOW (ref 3.5–5.1)
Sodium: 137 mmol/L (ref 135–145)
Total Bilirubin: 0.5 mg/dL (ref 0.3–1.2)
Total Protein: 7.5 g/dL (ref 6.5–8.1)

## 2023-02-26 LAB — I-STAT CHEM 8, ED
BUN: 13 mg/dL (ref 6–20)
Calcium, Ion: 1.11 mmol/L — ABNORMAL LOW (ref 1.15–1.40)
Chloride: 108 mmol/L (ref 98–111)
Creatinine, Ser: 0.9 mg/dL (ref 0.44–1.00)
Glucose, Bld: 76 mg/dL (ref 70–99)
HCT: 37 % (ref 36.0–46.0)
Hemoglobin: 12.6 g/dL (ref 12.0–15.0)
Potassium: 3 mmol/L — ABNORMAL LOW (ref 3.5–5.1)
Sodium: 140 mmol/L (ref 135–145)
TCO2: 15 mmol/L — ABNORMAL LOW (ref 22–32)

## 2023-02-26 LAB — PROTIME-INR
INR: 1 (ref 0.8–1.2)
Prothrombin Time: 13.7 s (ref 11.4–15.2)

## 2023-02-26 LAB — HCG, SERUM, QUALITATIVE: Preg, Serum: NEGATIVE

## 2023-02-26 LAB — ETHANOL: Alcohol, Ethyl (B): 36 mg/dL — ABNORMAL HIGH (ref <10)

## 2023-02-26 LAB — I-STAT CG4 LACTIC ACID, ED: Lactic Acid, Venous: 5.1 mmol/L (ref 0.5–1.9)

## 2023-02-26 MED ORDER — LACTATED RINGERS IV BOLUS
500.0000 mL | Freq: Once | INTRAVENOUS | Status: AC
  Administered 2023-02-26: 500 mL via INTRAVENOUS

## 2023-02-26 MED ORDER — TETANUS-DIPHTH-ACELL PERTUSSIS 5-2.5-18.5 LF-MCG/0.5 IM SUSY
0.5000 mL | PREFILLED_SYRINGE | Freq: Once | INTRAMUSCULAR | Status: AC
  Administered 2023-02-26: 0.5 mL via INTRAMUSCULAR

## 2023-02-26 MED ORDER — ONDANSETRON HCL 4 MG/2ML IJ SOLN
4.0000 mg | Freq: Once | INTRAMUSCULAR | Status: AC
  Administered 2023-02-26: 4 mg via INTRAVENOUS
  Filled 2023-02-26: qty 2

## 2023-02-26 MED ORDER — HYDRALAZINE HCL 20 MG/ML IJ SOLN: 10.0000 mg | INTRAMUSCULAR | Status: DC | PRN

## 2023-02-26 MED ORDER — ACETAMINOPHEN 500 MG PO TABS: 1000.0000 mg | ORAL_TABLET | Freq: Four times a day (QID) | ORAL | Status: DC

## 2023-02-26 MED ORDER — POLYETHYLENE GLYCOL 3350 17 G PO PACK: 17.0000 g | PACK | Freq: Every day | ORAL | Status: DC | PRN

## 2023-02-26 MED ORDER — GABAPENTIN 300 MG PO CAPS: 300.0000 mg | ORAL_CAPSULE | Freq: Three times a day (TID) | ORAL | Status: DC

## 2023-02-26 MED ORDER — SODIUM CHLORIDE 0.9 % IV SOLN: INTRAVENOUS | Status: DC

## 2023-02-26 MED ORDER — SODIUM CHLORIDE 0.9 % IV BOLUS: 500.0000 mL | Freq: Once | INTRAVENOUS | Status: DC

## 2023-02-26 MED ORDER — METHOCARBAMOL 500 MG PO TABS: 500.0000 mg | ORAL_TABLET | Freq: Three times a day (TID) | ORAL | Status: DC

## 2023-02-26 MED ORDER — ONDANSETRON 4 MG PO TBDP: 4.0000 mg | ORAL_TABLET | Freq: Four times a day (QID) | ORAL | Status: DC | PRN

## 2023-02-26 MED ORDER — METOPROLOL TARTRATE 5 MG/5ML IV SOLN: 5.0000 mg | Freq: Four times a day (QID) | INTRAVENOUS | Status: DC | PRN

## 2023-02-26 MED ORDER — CLINDAMYCIN HCL 150 MG PO CAPS: 300.0000 mg | ORAL_CAPSULE | Freq: Once | ORAL | Status: DC

## 2023-02-26 MED ORDER — CEFAZOLIN SODIUM-DEXTROSE 2-4 GM/100ML-% IV SOLN
2.0000 g | Freq: Once | INTRAVENOUS | Status: AC
  Administered 2023-02-26: 2 g via INTRAVENOUS

## 2023-02-26 MED ORDER — HYDROMORPHONE HCL 1 MG/ML IJ SOLN: 1.0000 mg | INTRAMUSCULAR | Status: DC | PRN

## 2023-02-26 MED ORDER — DOCUSATE SODIUM 100 MG PO CAPS: 100.0000 mg | ORAL_CAPSULE | Freq: Two times a day (BID) | ORAL | Status: DC

## 2023-02-26 MED ORDER — FENTANYL CITRATE PF 50 MCG/ML IJ SOSY
50.0000 ug | PREFILLED_SYRINGE | Freq: Once | INTRAMUSCULAR | Status: AC
  Administered 2023-02-26: 50 ug via INTRAVENOUS
  Filled 2023-02-26: qty 1

## 2023-02-26 MED ORDER — ONDANSETRON HCL 4 MG/2ML IJ SOLN: 4.0000 mg | Freq: Four times a day (QID) | INTRAMUSCULAR | Status: DC | PRN

## 2023-02-26 MED ORDER — METHOCARBAMOL 1000 MG/10ML IJ SOLN
500.0000 mg | Freq: Three times a day (TID) | INTRAVENOUS | Status: DC
  Administered 2023-02-26: 500 mg via INTRAVENOUS
  Filled 2023-02-26: qty 500

## 2023-02-26 MED ORDER — OXYCODONE HCL 5 MG PO TABS: 5.0000 mg | ORAL_TABLET | ORAL | Status: DC | PRN
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
## 2023-02-26 NOTE — Progress Notes (Signed)
Orthopedic Tech Progress Note Patient Details:  Veronica Jones [birth year] [id number]  Patient ID: [name], female   DOB: 1990-10-07, 32 y.o.   MRN: [id number] I attended trauma page. Trinna Post 02/26/2023, 2:19 AM
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
## 2023-02-27 LAB — TYPE AND SCREEN
ABO/RH(D): O POS
Antibody Screen: NEGATIVE

## 2023-02-27 LAB — ABO/RH: ABO/RH(D): O POS

## 2023-03-02 HISTORY — PX: MANDIBLE FRACTURE SURGERY: SHX706

## 2023-10-19 ENCOUNTER — Other Ambulatory Visit (HOSPITAL_COMMUNITY)
Admission: RE | Admit: 2023-10-19 | Discharge: 2023-10-19 | Disposition: A | Discharge status: Home / Self Care | Source: Ambulatory Visit | Attending: Obstetrics and Gynecology | Admitting: Obstetrics and Gynecology

## 2023-10-19 ENCOUNTER — Ambulatory Visit: Admitting: Obstetrics and Gynecology

## 2023-10-19 ENCOUNTER — Other Ambulatory Visit: Payer: Self-pay

## 2023-10-19 VITALS — BP 123/82 | HR 71 | Wt 155.0 lb

## 2023-10-19 DIAGNOSIS — N761 Subacute and chronic vaginitis: Secondary | ICD-10-CM | POA: Insufficient documentation

## 2023-10-19 DIAGNOSIS — Z113 Encounter for screening for infections with a predominantly sexual mode of transmission: Secondary | ICD-10-CM | POA: Diagnosis present

## 2023-10-19 DIAGNOSIS — N941 Unspecified dyspareunia: Secondary | ICD-10-CM | POA: Diagnosis not present
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
## 2023-10-20 ENCOUNTER — Encounter: Payer: Self-pay | Admitting: Obstetrics and Gynecology

## 2023-10-20 LAB — RPR+HBSAG+HCVAB+...
HIV Screen 4th Generation wRfx: NONREACTIVE
Hep C Virus Ab: NONREACTIVE
Hepatitis B Surface Ag: NEGATIVE
RPR Ser Ql: NONREACTIVE

## 2023-10-20 LAB — CERVICOVAGINAL ANCILLARY ONLY
Bacterial Vaginitis (gardnerella): POSITIVE — AB
Candida Glabrata: NEGATIVE
Candida Vaginitis: POSITIVE — AB
Chlamydia: NEGATIVE
Comment: NEGATIVE
Comment: NEGATIVE
Comment: NEGATIVE
Comment: NEGATIVE
Comment: NEGATIVE
Comment: NORMAL
Neisseria Gonorrhea: NEGATIVE
Trichomonas: NEGATIVE

## 2023-10-23 ENCOUNTER — Other Ambulatory Visit: Payer: Self-pay | Admitting: Obstetrics and Gynecology

## 2023-10-23 DIAGNOSIS — B9689 Other specified bacterial agents as the cause of diseases classified elsewhere: Secondary | ICD-10-CM

## 2023-10-23 DIAGNOSIS — B3731 Acute candidiasis of vulva and vagina: Secondary | ICD-10-CM

## 2023-10-23 MED ORDER — METRONIDAZOLE 500 MG PO TABS: 500.0000 mg | ORAL_TABLET | Freq: Two times a day (BID) | ORAL | 0 refills | Status: AC

## 2023-10-23 MED ORDER — FLUCONAZOLE 150 MG PO TABS: 150.0000 mg | ORAL_TABLET | Freq: Once | ORAL | 1 refills | Status: AC

## 2023-11-08 ENCOUNTER — Telehealth: Payer: Self-pay

## 2023-11-08 NOTE — Telephone Encounter (Signed)
 Patient called RN line regarding medication that was prescribed to her. Patient reports that she went to pick up her medication at [facility] and was told to pay $30 to receive this medication. Would like for someone to call her back as patient states reports that she has Medicaid.  Moira Andrews, RN

## 2023-11-08 NOTE — Telephone Encounter (Signed)
 Called and spoke with patient. She has a concern about the cost of her Medication. She was able to get it worked it out as she gave them her new insurance card.

## 2024-01-07 NOTE — Therapy (Incomplete)
 OUTPATIENT PHYSICAL THERAPY FEMALE PELVIC EVALUATION   Patient Name: Veronica Jones MRN: [id number] DOB:16-Feb-1991, 33 y.o., female Today's Date: 01/07/2024  END OF SESSION:   Past Medical History:  Diagnosis Date   Chlamydia 2013   Hx of trichomoniasis    Postpartum eclampsia 04/19/2015   Past Surgical History:  Procedure Laterality Date   NO PAST SURGERIES     Patient Active Problem List   Diagnosis Date Noted   Mandible fracture (HCC) 02/26/2023   Anxiety 06/03/2021   Rubella non-immune status, antepartum 03/01/2021   Pap smear abnormality of cervix with LGSIL 02/27/2021   History of eclampsia 03/12/2020    PCP: no  REFERRING PROVIDER: Jeralyn Crutch, MD  REFERRING DIAG: N94.10 (ICD-10-CM) - Dyspareunia in female 10/19/2023  THERAPY DIAG:  No diagnosis found.  Rationale for Evaluation and Treatment: Rehabilitation  ONSET DATE: ***  ONSET DATE: ***  SUBJECTIVE:                                                                                                                                                                                           SUBJECTIVE STATEMENT: ***   PAIN:  Are you having pain? {yes/no:20286} NPRS scale: ***/10 Pain location: {pelvic pain location:27098}  Pain type: {type:313116} Pain description: {PAIN DESCRIPTION:21022940}   Aggravating factors: *** Relieving factors: ***  PRECAUTIONS: {Therapy precautions:24002}  RED FLAGS: {PT Red Flags:29287}   WEIGHT BEARING RESTRICTIONS: {Yes ***/No:24003}  FALLS:  Has patient fallen in last 6 months? {fallsyesno:27318}  OCCUPATION: ***  ACTIVITY LEVEL : ***  PLOF: {PLOF:24004}  PATIENT GOALS: ***  PERTINENT HISTORY:  *** Sexual abuse: {Yes/No:304960894}  BOWEL MOVEMENT: Pain with bowel movement: {yes/no:20286} Type of bowel movement:{PT BM type:27100} Fully empty rectum: {No/Yes:304960894} Leakage: {Yes/No:304960894} Pads: {Yes/No:304960894} Fiber  supplement/laxative {YES/NO AS:20300}  URINATION: Pain with urination: {yes/no:20286} Fully empty bladder: {Yes/No:304960894} Stream: {PT urination:27102} Urgency: {YES/NO AS:20300} Frequency: *** Fluid Intake:  Leakage: {PT leakage:27103} Pads: {Yes/No:304960894}  INTERCOURSE:  Ability to have vaginal penetration {YES/NO:21197} Pain with intercourse: {pain with intercourse PA:27099} Dryness{YES/NO AS:20300} Climax: *** Marinoff Scale: ***/3 Lubricant:  PREGNANCY: Vaginal deliveries *** Tearing {Yes***/No:304960894} Episiotomy {YES/NO AS:20300} C-section deliveries *** Currently pregnant {Yes***/No:304960894}  PROLAPSE: {PT prolapse:27101}   OBJECTIVE:  Note: Objective measures were completed at Evaluation unless otherwise noted.   PATIENT SURVEYS:   PFIQ-7: ***  COGNITION: Overall cognitive status: Within functional limits for tasks assessed     SENSATION: Light touch: Appears intact   FUNCTIONAL TESTS:  Squat: Single leg stance:  Rt:  Lt: Curl-up test:   GAIT: Assistive device utilized: {Assistive devices:23999} Comments: ***  POSTURE: {posture:25561}   LUMBARAROM/PROM:  A/PROM A/PROM  Eval (% available)  Flexion   Extension   Right lateral flexion   Left lateral flexion   Right rotation   Left rotation    (Blank rows = not tested)  PALPATION:   General: ***  Pelvic Alignment: ***  Abdominal: ***                External Perineal Exam: ***                             Internal Pelvic Floor: ***  Patient confirms identification and approves PT to assess internal pelvic floor and treatment {yes/no:20286}  PELVIC MMT:   MMT eval  Vaginal   Internal Anal Sphincter   External Anal Sphincter   Puborectalis   Diastasis Recti   (Blank rows = not tested)        TONE: ***  PROLAPSE: ***  TODAY'S TREATMENT:                                                                                                                               DATE:  *** EVAL  Manual:  Neuromuscular re-education:  Exercises:  Therapeutic activities:     PATIENT EDUCATION:  Education details: Pt was educated on relevant anatomy, exam findings, HEP, expectations of PT   Person educated: Patient Education method: Explanation, Demonstration, Tactile cues, Verbal cues, and Handouts Education comprehension: verbalized understanding  HOME EXERCISE PROGRAM: ***  ASSESSMENT:  CLINICAL IMPRESSION: Patient is a *** y.o. *** who was seen today for physical therapy evaluation and treatment for ***. Exam findings are notable for *** breathing strategies, abdominal ***, *** pelvic floor muscles. External soft tissues of pelvic floor are ***. Patient demonstrates trunk ***, bilateral hip ***, decreased strength in ***, tightness in ***, reduced range or motion in *** and pain in ***. It is difficult for patient to *** due to *** . Discussed findings with patient, recommended *** today, patient was educated on *** and HEP was initiated. Patient's quality of life has been affected, patient will benefit from physical therapy to address deficits, reduce *** and improve ***.    OBJECTIVE IMPAIRMENTS: decreased activity tolerance, decreased coordination, decreased endurance, decreased mobility, decreased ROM, decreased strength, increased fascial restrictions, increased muscle spasms, impaired flexibility, impaired tone, improper body mechanics, postural dysfunction, and pain.   ACTIVITY LIMITATIONS: {activitylimitations:27494}  PARTICIPATION LIMITATIONS: {participationrestrictions:25113}  PERSONAL FACTORS: {Personal factors:25162} are also affecting patient's functional outcome.   REHAB POTENTIAL: {rehabpotential:25112}  CLINICAL DECISION MAKING: {clinical decision making:25114}  EVALUATION COMPLEXITY: {Evaluation complexity:25115}   GOALS: Goals reviewed with patient? {yes/no:20286}  SHORT TERM GOALS: Target date: ***  Pt will be  independent with HEP.   Baseline: Goal status: INITIAL  2.  *** Baseline:  Goal status: INITIAL  3.  *** Baseline:  Goal status: INITIAL  4.  *** Baseline:  Goal status: INITIAL  5.  *** Baseline:  Goal status: INITIAL  6.  *** Baseline:  Goal status: INITIAL  LONG TERM GOALS: Target date: ***  Pt will be independent with advanced HEP.   Baseline:  Goal status: INITIAL  2.  *** Baseline:  Goal status: INITIAL  3.  *** Baseline:  Goal status: INITIAL  4.  *** Baseline:  Goal status: INITIAL  5.  *** Baseline:  Goal status: INITIAL  6.  *** Baseline:  Goal status: INITIAL  PLAN:  PT FREQUENCY: 1-2x/week  PT DURATION: ***   PLANNED INTERVENTIONS: 97110-Therapeutic exercises, 97530- Therapeutic activity, [id number]- Neuromuscular re-education, 97535- Self Care, 02859- Manual therapy, 20560 (1-2 muscles), 20561 (3+ muscles), 02886- Aquatic Therapy, and Biofeedback  PLAN FOR NEXT SESSION: ***  Tasha Jindra, PT 01/07/2024, 4:47 PM

## 2024-01-08 ENCOUNTER — Telehealth: Payer: Self-pay | Admitting: Physical Therapy

## 2024-01-08 ENCOUNTER — Ambulatory Visit: Attending: Obstetrics and Gynecology | Admitting: Physical Therapy

## 2024-01-08 NOTE — Telephone Encounter (Signed)
 Left patient a VM re no show for PT eval. Patient to call us  and reschedule if she is interested at [phone]

## 2024-02-17 NOTE — Telephone Encounter (Signed)
 Please let the patient know that she may have an allergy to some of the facial products including Moderma that were using for a welt that is a reaction to some of the hardware in her face.  I recommend taking Benadryl  when this occurs and using over-the-counter cortisone to treat the welt.
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
## 2024-03-21 ENCOUNTER — Encounter (HOSPITAL_COMMUNITY): Payer: Self-pay | Admitting: Obstetrics and Gynecology

## 2024-03-21 ENCOUNTER — Inpatient Hospital Stay (HOSPITAL_COMMUNITY)
Admission: AD | Admit: 2024-03-21 | Discharge: 2024-03-21 | Disposition: A | Discharge status: Home / Self Care | Attending: Obstetrics and Gynecology | Admitting: Obstetrics and Gynecology

## 2024-03-21 ENCOUNTER — Other Ambulatory Visit: Payer: Self-pay

## 2024-03-21 DIAGNOSIS — Z3A01 Less than 8 weeks gestation of pregnancy: Secondary | ICD-10-CM | POA: Diagnosis not present

## 2024-03-21 DIAGNOSIS — O218 Other vomiting complicating pregnancy: Secondary | ICD-10-CM | POA: Insufficient documentation

## 2024-03-21 DIAGNOSIS — R1111 Vomiting without nausea: Secondary | ICD-10-CM | POA: Diagnosis present

## 2024-03-21 DIAGNOSIS — O219 Vomiting of pregnancy, unspecified: Secondary | ICD-10-CM

## 2024-03-21 DIAGNOSIS — Z3201 Encounter for pregnancy test, result positive: Secondary | ICD-10-CM | POA: Insufficient documentation

## 2024-03-21 DIAGNOSIS — R197 Diarrhea, unspecified: Secondary | ICD-10-CM | POA: Diagnosis present

## 2024-03-21 LAB — URINALYSIS, ROUTINE W REFLEX MICROSCOPIC
Bilirubin Urine: NEGATIVE
Glucose, UA: NEGATIVE mg/dL
Hgb urine dipstick: NEGATIVE
Ketones, ur: 5 mg/dL — AB
Nitrite: NEGATIVE
Protein, ur: NEGATIVE mg/dL
Specific Gravity, Urine: 1.01 (ref 1.005–1.030)
pH: 6 (ref 5.0–8.0)

## 2024-03-21 LAB — POCT PREGNANCY, URINE: Preg Test, Ur: POSITIVE — AB

## 2024-03-21 MED ORDER — ONDANSETRON HCL 4 MG PO TABS: 8.0000 mg | ORAL_TABLET | Freq: Two times a day (BID) | ORAL | 0 refills | Status: DC

## 2024-03-21 MED ORDER — PRENATAL 28-0.8 MG PO TABS: 1.0000 | ORAL_TABLET | Freq: Every day | ORAL | 12 refills | Status: AC

## 2024-03-21 NOTE — MAU Provider Note (Signed)
 S [name] is a 33 y.o. [phone] patient who presents to MAU today stating she had a positive home pregnancy test yesterday and since then she has been having some retching without vomiting and has had some occasional bouts of diarrhea which have since resolved.  She denies any vaginal bleeding or abdominal cramping.  Patient states her last menstrual period was 02/13/2024 and states she needs a letter confirming her pregnancy prior to starting her prenatal care.    O BP 120/70 (BP Location: Right Arm)   Pulse 89   Temp 98.4 F (36.9 C) (Oral)   Resp 18   Ht 5' 4 (1.626 m)   Wt 80.2 kg   SpO2 100%   BMI 30.35 kg/m  Physical Exam Vitals and nursing note reviewed.  Constitutional:      General: She is not in acute distress.    Appearance: Normal appearance. She is not ill-appearing.  HENT:     Head: Normocephalic.     Nose: Nose normal.     Mouth/Throat:     Mouth: Mucous membranes are moist.  Cardiovascular:     Rate and Rhythm: Normal rate.  Pulmonary:     Effort: Pulmonary effort is normal.  Abdominal:     Palpations: Abdomen is soft.  Musculoskeletal:        General: Normal range of motion.     Cervical back: Normal range of motion.  Skin:    General: Skin is warm.  Neurological:     Mental Status: She is alert and oriented to person, place, and time.  Psychiatric:        Mood and Affect: Mood normal.        Behavior: Behavior normal.     MDM  LOW/MODERATE   Chart reviewed Physical exam performed Urine pregnancy test  ( Positive) Prescriptions for prenatal vitamin and and Zofran  sent at discharge  Orders Placed This Encounter  Procedures   Urinalysis, Routine w reflex microscopic -Urine, Clean Catch    Standing Status:   Standing    Number of Occurrences:   1    Specimen Source:   Urine, Clean Catch [76]   Pregnancy, urine POC    Standing Status:   Standing    Number of Occurrences:   1   Discharge patient Discharge disposition: 01-Home  or Self Care; Discharge patient date: 03/21/2024    Standing Status:   Standing    Number of Occurrences:   1    Discharge disposition:   01-Home or Self Care [1]    Discharge patient date:   03/21/2024      Results for orders placed or performed during the hospital encounter of 03/21/24 (from the past 24 hours)  Pregnancy, urine POC     Status: Abnormal   Collection Time: 03/21/24 10:32 AM  Result Value Ref Range   Preg Test, Ur POSITIVE (A) NEGATIVE     I have reviewed the patient chart and performed the physical exam . Medications ordered as stated below.  A/P as described below.  Counseling and education provided and patient agreeable  with plan as described below. Verbalized understanding.    ASSESSMENT Medical screening exam complete Pregnancy test performed, pregnancy confirmed  Retching during pregnancy    PLAN  Discharge from MAU in stable condition  See AVS for full description of educational information and instructions provided to the patient at time of discharge  List of options for follow-up given with letter confirming pregnancy  Warning signs for worsening condition that would warrant emergency follow-up discussed Patient may return to MAU as needed   Veronica Jones, [facility], NP 03/21/2024 11:15 AM   This chart was dictated using voice recognition software, Dragon. Despite the best efforts of this provider to proofread and correct errors, errors may still occur which can change documentation meaning.

## 2024-03-21 NOTE — MAU Note (Signed)
 Veronica Jones is a 33 y.o. at Unknown here in MAU reporting: she had a +HPT yesterday and has been dry heaving (without vomiting) and has had several bouts of diarrhea intermittently since yesterday.  Denies VB or pain.  LMP: 02/13/2024 Onset of complaint: yesterday Pain score: 0 Vitals:   03/21/24 1022  BP: 120/70  Pulse: 89  Resp: 18  Temp: 98.4 F (36.9 C)  SpO2: 100%     FHT: NA  Lab orders placed from triage: UPT & UA

## 2024-03-21 NOTE — Discharge Instructions (Signed)
 Commonly Asked Questions During Pregnancy  How Will I Feel When I'm Pregnant? Pregnancy symptoms in the first trimester of pregnancy may not appear until the middle or end of the second month. Hormonal changes will cause tenderness in your breasts, and you may begin to feel more tired than usual. Food cravings, an increase in the need to urinate, and morning sickness may all be more noticeable.  Pregnancy symptoms in the second trimester are more prominent. You may start to feel the baby move and become more active. Dental issues, nasal/sinus problems, and skin irritations can begin to appear. Heartburn, leg cramps, dizziness, and a vaginal discharge are also common. Every woman is different when it comes to the symptoms they experience, and some may not experience any at all. Pregnancy symptoms in the third trimester can include increased frequency in urination, leg cramps, constipation, ligament pain in the abdomen, and weight gain. Back pain and Braxton Hicks contractions will become increasingly more common.  Why is nutrition during pregnancy important? Eating well is one of the best things you can do during pregnancy. Good nutrition helps you handle the extra demands on your body as your pregnancy progresses. The goal is to balance getting enough nutrients to support the growth of your fetus and maintaining a healthy weight.  How much water should I drink during pregnancy? During pregnancy you should drink 8 to 12 cups (64 to 96 ounces) of water every day. Water has many benefits. It aids digestion and helps form the amniotic fluid around the fetus. Water also helps nutrients circulate in the body and helps waste leave the body.  What can I do to help with nausea? Eat dry toast or crackers in the morning before you get out of bed to avoid moving around on an empty stomach. Eat five or six "mini meals" a day to ensure that your stomach is never empty. Eat frequent bites of foods like nuts,  fruits, or crackers.  What can help with constipation during pregnancy? Constipation is common near the end of pregnancy. Eating more foods with fiber can help fight constipation. Fiber is found in fruits, vegetables, whole grains, beans, nuts, and seeds. You should aim for about 25 grams of fiber in your diet each day. Drink a lot of water as you increase your fiber intake.  How much coffee can I drink while I'm pregnant? Research suggests that moderate caffeine consumption (less than 200 milligrams per day) does not cause miscarriage or preterm birth. That's the amount in one 12-ounce cup of coffee. Remember that caffeine also is found in tea, chocolate, energy drinks, and soft drinks. Caffeine can interfere with sleep and contribute to nausea and light-headedness. Caffeine also can increase urination and lead to dehydration.  What can I do to prevent or ease back pain during pregnancy? There are several things you can do to prevent or ease back pain. For example, wear supportive clothing and shoes. Pay attention to your position when sitting, sleeping, and lifting things. If you need to stand for a long time, rest one foot on a stool or a box to take the strain off your back. You also can use heat or cold to soothe sore muscles.  Is it safe to exercise during pregnancy? If you are healthy and your pregnancy is normal, it is safe to continue or start regular physical activity. Physical activity does not increase your risk of miscarriage, low birth weight, or early delivery. It's still important to discuss exercise with your ob-gyn  provider during your early prenatal visits.   What are the benefits of exercise during pregnancy? Regular exercise during pregnancy benefits you and your fetus in these key ways: Reduces back pain Eases constipation May decrease your risk of gestational diabetes, preeclampsia, and cesarean birth Promotes healthy weight gain during pregnancy Improves your overall  fitness and strengthens your heart and blood vessels Helps you to lose the baby weight after your baby is born  Is it safe to dye my hair during pregnancy? Yes, it's safe. Only a small amount of chemicals from hair dye is absorbed through the scalp.  Is it safe to keep a cat during pregnancy? Yes, you can keep your cat. You may have heard that cat feces can carry the infection toxoplasmosis. This infection is only found in cats who go outdoors and hunt prey, such as mice and other rodents. If you do have a cat who goes outdoors or eats prey, have someone else take over daily cleaning the litter box. This will keep you away from any cat feces. If you have an indoor cat who only eats cat food and doesn't have contact with outside animals, your risk of toxoplasmosis is very low.  What substances should I avoid during pregnancy? During pregnancy, women should not use tobacco, alcohol, marijuana, illegal drugs, or prescription medications for nonmedical reasons. Avoiding these substances and getting regular prenatal care are important to having a healthy pregnancy and a healthy baby.   What foods to I need to avoid in pregnancy? To help prevent listeriosis, avoid eating the following foods while you are pregnant: Unpasteurized milk and foods made with unpasteurized milk, including soft cheeses Hot dogs and luncheon meats, unless they are heated until steaming hot just before serving Unwashed raw produce such as fruits and vegetables  Avoid all raw and undercooked seafood, eggs, meat, and poultry while you are pregnant. Do not eat sushi made with raw fish (cooked sushi is safe). Cooking and pasteurization are the only ways to kill Listeria.  Limit your exposure to mercury by not eating bigeye tuna, king mackerel, marlin, orange roughy, shark, swordfish, or tilefish. Limit eating white (albacore) tuna to 6 ounces a week. You do not have to avoid all fish during pregnancy. In fact, fish and shellfish are  nutritious foods with vital nutrients for a pregnant woman and her fetus. Be sure to eat at least 8-12 ounces of low-mercury fish and shellfish per week.  Is travel safe to during pregnancy? In most cases, pregnant women can travel safely until close to their due dates. But travel may not be recommended for women who have pregnancy complications. If you are planning a trip, talk with your (ob-gyn) provider. And no matter how you choose to travel, think ahead about your comfort and safety.  Can I use a sauna or hot tub early in pregnancy? It's best not to. Your core body temperature rises when you use saunas and hot tubs. This rise in temperature can be harmful for your fetus.  Can I get a massage while pregnant? Yes. Massage is a good way to relax and improve circulation. The best position for a massage while you're pregnant is lying on your side, rather than facedown. Some massage tables have a cut-out for the belly, allowing you to lie facedown comfortably. Tell your massage therapist that you're pregnant if you're not showing yet. Many health spas offer special prenatal massages done by therapists who are trained to work on pregnant women.  Is Having Dental  Work While Pregnant Safe? Pregnancy and dental work questions are common for expecting moms. Preventive dental cleanings and annual exams during pregnancy are not only safe but are recommended. The rise in hormone levels during pregnancy causes the gums to swell, bleed, and trap food causing increased irritation to your gums. Preventive dental work while pregnant is essential to avoid oral infections such as gum disease, which has been linked to preterm birth. The American Dental Association (ADA) recommends pregnant women eat a balanced diet, brush their teeth thoroughly with ADA-approved fluoride toothpaste twice a day, and floss daily. Have preventive exams and cleanings during your pregnancy. Let your dentist know you are  pregnant. Postpone non-emergency dental work until the second trimester or after delivery, if possible. Elective procedures should be postponed until after the delivery.  We highly recommend childbirth education to help you plan for labor and begin practicing coping skills (which will be needed with or without pain meds).  [place] Childbirth Education Options: Sign up by visiting [email or web address]  Childbirth ~ Self-Paced eClass (English and Spanish) This online class offers you the freedom to complete a childbirth education series in the comfort of your own home at your own pace.  Childbirth Class (In-Person 4-Week Series  or on Saturdays, Virtual 4-Week Series ~ [place]) This interactive in-person class series will help you and your partner prepare for your birth experience. Topics include: Labor & Birth, Comfort Measures, Breathing Techniques, Massage, Medical Interventions, Pain Management Options, Cesarean Birth, Postpartum Care, and Newborn Care  Comfort Techniques for Labor ~ In-Person Class [facility]) This interactive class is designed for parents-to-be who want to learn & practice hands-on skills to help relieve some of the discomfort of labor and encourage their babies to rotate toward the best position for birth. Moms and their partners will be able to try a variety of labor positions with birth balls and rebozos as well as practice breathing, relaxation, and visualization techniques.  Natural Childbirth Class (In-Person 5-Week Series, In-Person on Saturdays or Virtual 5-Week Series ~ [place]) This class series is designed for expectant parents who want to learn and practice natural methods of coping with the process of labor and childbirth.  Cesarean Birth Self-Paced eClass (English and Spanish) This online course provides comprehensive information you can trust as you prepare for a possible cesarean birth. In this class, you'll learn how to make your birth and recovery  comfortable and joyful through instructive video clips, animations, and activities.  Waterbirth ~ Airline pilot Interested in a waterbirth? In addition to a consultation with your credentialed waterbirth provider, this free, informational online class will help you discover whether waterbirth is the right fit for you. Not all obstetrical practices offer waterbirth, so check with your healthcare provider.  Tour Probation officer) - [facility] [employer] our 4 minute video tour of [facility] located in [place].   [place] Parenting Education Options:  Pregnancy 101 (Virtual) Congratulations on your pregnancy! This class is geared toward moms in their first trimester, but everyone is welcome. We are excited to guide you through all aspects of supporting a healthy pregnancy. You will learn what to expect at routine prenatal care appointments, common postpartum adjustments, basic infant safety, and breastfeeding.  Successful Partnering & Parenting ~ In-Person Workshop [facility]) This workshop inspires and equips partners of all economic levels, ages, and cultures to confidently care for their infants, support the birthing persons, and navigate their own transformations into new partners and parents. Learning  activities are geared towards supporting partner, but moms are welcome to attend.  'Baby & Me' Parenting Group (Virtual on Wednesdays at 11am) Enjoy this time discussing newborn & infant parenting topics and family adjustment issues with other new parents in a relaxed environment. Each week brings a new speaker or baby-centered activity. This group offers support and connection to parents as they journey through the adjustments and struggles of that sometimes overwhelming first year after the birth of a child.  Baby Safety, CPR, & Choking Class ~ Virtual This life-saving information is meant to encourage parents as they learn important  safety and prevention tips as well as infant CPR and relief of choking.  Breastfeeding Class (In-Person in [place] or [employer]) Families learn what to expect in the first days and weeks of breastfeeding your newborn. IF YOU ARE AN EMPLOYEE TAKING THIS CLASS FOR CREDIT, DO NOT register yourself. Please e-mail [email or web address].   Breastfeeding Self-Paced eClass (English & Spanish) Families learn what to expect in the first days and weeks of breastfeeding your newborn.  Caring for Baby ~ In-Person, Virtual or Self-Paced Class This in-person class is for both expectant and adoptive parents who want to learn and practice the most up-to-date newborn care for their babies. Focus is on birth through the first six weeks of life.  CPR & Choking Relief for Infants & Children ~ In-Person Class [facility]) This in-person course is designed for any parent, expectant parent, or adult who cares for infants or children. Participants learn and demonstrate cardiopulmonary resuscitation and choking relief procedures for both infants and children.  Grandparent Love ~ In-Person Class Grandparents will learn the most updated infant care and safety recommendations. They will discover ways to support their own children during the transition into the parenting role and receive tips on communicating with the new parents.  [place] Parenting Support Group Options:  Bereavement Grief Support Group (Pregnancy/Infant Loss) - Virtual This is an ongoing experience that meets once a month and is designed to help you honor the past, assist you in discovering tools to strengthen you today, and aid you in developing hope for the future.  Breastfeeding & Pumping Support Group (In-Person on Thursdays at 12pm or Virtual on Tuesdays at 5pm) Join [place] in-person each Thursday starting June 1st, 2023 at 12pm! This support group is free for all families looking for breastfeeding and/or pumping support.   Community-Based  Childbirth Education Options:  [facility] Classes:  Childbirth education classes can help you get ready for a positive parenting experience. You can also meet other expectant parents and get free stuff for your baby. Each class runs for five weeks on the same night and costs $45 for the mother-to-be and her support person. Medicaid covers the cost if you are eligible. Call [phone] to register.  [facility] offers a variety of programs for the [employer] and is another great way to get connected. Please go to [email or web address] for more information.  Childbirth With A Twist! Be informed of your options, get educated on birth, understand what your body is doing, learn how to cope, and have a lot of fun and laughs all while doing it either from the comfort of your couch OR in our cozy office and classroom space near the [place] airport. If you are taking a virtual class, then class is taught LIVE, so you can ask questions and receive answers in real-time from an experienced doula and childbirth educator.  This virtual childbirth education class will  meet for five instruction times online.  Although we are based in [place], [place], this virtual class is open to anyone in the world. Please visit: [email or web address] for more information.  Books We Love: The Doula Guide to Childbirth by Harland German and Otila Back The First-Time Parent's Childbirth Handbook by Dr. Amie Critchley, CNM The Birth Partner by Truddie Crumble

## 2024-04-25 ENCOUNTER — Telehealth

## 2024-04-25 DIAGNOSIS — Z3A10 10 weeks gestation of pregnancy: Secondary | ICD-10-CM

## 2024-04-25 DIAGNOSIS — O3680X0 Pregnancy with inconclusive fetal viability, not applicable or unspecified: Secondary | ICD-10-CM

## 2024-04-25 DIAGNOSIS — Z3491 Encounter for supervision of normal pregnancy, unspecified, first trimester: Secondary | ICD-10-CM

## 2024-04-25 DIAGNOSIS — Z3490 Encounter for supervision of normal pregnancy, unspecified, unspecified trimester: Secondary | ICD-10-CM | POA: Insufficient documentation

## 2024-04-25 MED ORDER — GOJJI WEIGHT SCALE MISC: 1.0000 | Freq: Once | 0 refills | Status: AC

## 2024-04-25 NOTE — Patient Instructions (Signed)

## 2024-04-25 NOTE — Progress Notes (Signed)
 New OB Intake  I connected with Veronica Jones  on 04/25/24 at  1:15 PM EST by MyChart Video Visit and verified that I am speaking with the correct person using two identifiers. Nurse is located at [facility] and pt is located at Home.  I discussed the limitations, risks, security and privacy concerns of performing an evaluation and management service by telephone and the availability of in person appointments. I also discussed with the patient that there may be a patient responsible charge related to this service. The patient expressed understanding and agreed to proceed.  I explained I am completing New OB Intake today. We discussed EDD of 11/19/2024 based on LMP of 02/13/2024. Pt is [id number]. I reviewed her allergies, medications and Medical/Surgical/OB history.    Patient Active Problem List   Diagnosis Date Noted   Encounter for supervision of low-risk pregnancy, antepartum 04/25/2024   Anxiety 06/03/2021   History of eclampsia 03/12/2020     Concerns addressed today  Delivery Plans Plans to deliver at [facility]. Discussed the nature of our practice with multiple providers including residents and students as well as female and female providers. Due to the size of the practice, the delivering provider may not be the same as those providing prenatal care.   Patient is interested in water birth.  MyChart/Babyscripts MyChart access verified. I explained pt will have some visits in office and some virtually. Babyscripts instructions given and order placed. Patient verifies receipt of registration text/e-mail. Account successfully created and app downloaded. If patient is a candidate for Optimized scheduling, add to sticky note.   Blood Pressure Cuff/Weight Scale Patient has blood pressure cuff. Explained after first prenatal appt pt will check weekly and document in Babyscripts. Patient does not have weight scale; order sent to [facility], patient may track weight weekly in  Babyscripts.  Anatomy US  Explained first scheduled US  will be around 19 weeks. Anatomy US  to be scheduled after viability ultrasound.  Is patient a CenteringPregnancy candidate?  Declined Declined due to Group setting  Is patient a Mom+Baby Combined Care candidate?  Not a candidate    Is patient a candidate for Babyscripts Optimization? No, due to hx eclampsia.   First visit review I reviewed new OB appt with patient. Explained pt will be seen by Vannie, CNM at first visit 05/08/24 at 2:55pm. Discussed [place] genetic screening with patient. Patient desires [employer] and [employer].. Routine prenatal labs needed at [facility] Visit.   Last Pap Diagnosis  Date Value Ref Range Status  10/13/2022   Final   - Negative for intraepithelial lesion or malignancy (NILM)    Waddell LITTIE Burows, RN 04/25/2024  2:06 PM

## 2024-04-29 ENCOUNTER — Other Ambulatory Visit: Payer: Self-pay | Admitting: Certified Nurse Midwife

## 2024-04-29 ENCOUNTER — Ambulatory Visit

## 2024-04-29 ENCOUNTER — Other Ambulatory Visit: Payer: Self-pay

## 2024-04-29 DIAGNOSIS — Z3A10 10 weeks gestation of pregnancy: Secondary | ICD-10-CM

## 2024-04-29 DIAGNOSIS — Z3491 Encounter for supervision of normal pregnancy, unspecified, first trimester: Secondary | ICD-10-CM

## 2024-04-29 DIAGNOSIS — O3680X0 Pregnancy with inconclusive fetal viability, not applicable or unspecified: Secondary | ICD-10-CM

## 2024-04-29 DIAGNOSIS — Z3490 Encounter for supervision of normal pregnancy, unspecified, unspecified trimester: Secondary | ICD-10-CM

## 2024-05-08 ENCOUNTER — Encounter: Admitting: Certified Nurse Midwife

## 2024-05-27 ENCOUNTER — Encounter: Payer: Self-pay | Admitting: Family Medicine

## 2024-05-27 ENCOUNTER — Other Ambulatory Visit: Payer: Self-pay

## 2024-05-27 ENCOUNTER — Ambulatory Visit: Admitting: Family Medicine

## 2024-05-27 ENCOUNTER — Other Ambulatory Visit (HOSPITAL_COMMUNITY)
Admission: RE | Admit: 2024-05-27 | Discharge: 2024-05-27 | Disposition: A | Source: Ambulatory Visit | Attending: Family Medicine | Admitting: Family Medicine

## 2024-05-27 VITALS — BP 118/76 | HR 88 | Wt 193.9 lb

## 2024-05-27 DIAGNOSIS — Z8759 Personal history of other complications of pregnancy, childbirth and the puerperium: Secondary | ICD-10-CM

## 2024-05-27 DIAGNOSIS — F419 Anxiety disorder, unspecified: Secondary | ICD-10-CM | POA: Diagnosis not present

## 2024-05-27 DIAGNOSIS — Z3A15 15 weeks gestation of pregnancy: Secondary | ICD-10-CM | POA: Diagnosis not present

## 2024-05-27 DIAGNOSIS — Z3490 Encounter for supervision of normal pregnancy, unspecified, unspecified trimester: Secondary | ICD-10-CM | POA: Diagnosis present

## 2024-05-27 DIAGNOSIS — Z3492 Encounter for supervision of normal pregnancy, unspecified, second trimester: Secondary | ICD-10-CM

## 2024-05-27 MED ORDER — ASPIRIN 81 MG PO TBEC: 162.0000 mg | DELAYED_RELEASE_TABLET | Freq: Every day | ORAL | 2 refills | Status: AC

## 2024-05-27 NOTE — Progress Notes (Unsigned)
° °

## 2024-05-28 LAB — COMPREHENSIVE METABOLIC PANEL WITH GFR
ALT: 12 IU/L (ref 0–32)
AST: 15 IU/L (ref 0–40)
Albumin: 3.7 g/dL — ABNORMAL LOW (ref 3.9–4.9)
Alkaline Phosphatase: 50 IU/L (ref 41–116)
BUN/Creatinine Ratio: 12 (ref 9–23)
BUN: 8 mg/dL (ref 6–20)
Bilirubin Total: <0.2 mg/dL (ref 0.0–1.2)
CO2: 20 mmol/L (ref 20–29)
Calcium: 9.4 mg/dL (ref 8.7–10.2)
Chloride: 105 mmol/L (ref 96–106)
Creatinine, Ser: 0.69 mg/dL (ref 0.57–1.00)
Globulin, Total: 2.7 g/dL (ref 1.5–4.5)
Glucose: 102 mg/dL — ABNORMAL HIGH (ref 70–99)
Potassium: 4.5 mmol/L (ref 3.5–5.2)
Sodium: 137 mmol/L (ref 134–144)
Total Protein: 6.4 g/dL (ref 6.0–8.5)
eGFR: 119 mL/min/1.73 (ref >59)

## 2024-05-28 LAB — CBC/D/PLT+RPR+RH+ABO+RUBIGG...
Antibody Screen: NEGATIVE
Basophils Absolute: 0 x10E3/uL (ref 0.0–0.2)
Basos: 0 %
EOS (ABSOLUTE): 0.1 x10E3/uL (ref 0.0–0.4)
Eos: 2 %
HCV Ab: NONREACTIVE
HIV Screen 4th Generation wRfx: NONREACTIVE
Hematocrit: 34.9 % (ref 34.0–46.6)
Hemoglobin: 11.5 g/dL (ref 11.1–15.9)
Hepatitis B Surface Ag: NEGATIVE
Immature Grans (Abs): 0 x10E3/uL (ref 0.0–0.1)
Immature Granulocytes: 0 %
Lymphocytes Absolute: 1.9 x10E3/uL (ref 0.7–3.1)
Lymphs: 28 %
MCH: 30.3 pg (ref 26.6–33.0)
MCHC: 33 g/dL (ref 31.5–35.7)
MCV: 92 fL (ref 79–97)
Monocytes Absolute: 0.4 x10E3/uL (ref 0.1–0.9)
Monocytes: 6 %
Neutrophils Absolute: 4.3 x10E3/uL (ref 1.4–7.0)
Neutrophils: 64 %
Platelets: 127 x10E3/uL — ABNORMAL LOW (ref 150–450)
RBC: 3.8 x10E6/uL (ref 3.77–5.28)
RDW: 15.2 % (ref 11.7–15.4)
RPR Ser Ql: NONREACTIVE
Rh Factor: POSITIVE
Rubella Antibodies, IGG: <0.9 {index} — ABNORMAL LOW (ref >0.99)
WBC: 6.8 x10E3/uL (ref 3.4–10.8)

## 2024-05-28 LAB — TSH: TSH: 1.35 u[IU]/mL (ref 0.450–4.500)

## 2024-05-28 LAB — GC/CHLAMYDIA PROBE AMP (~~LOC~~) NOT AT ARMC
Chlamydia: NEGATIVE
Comment: NEGATIVE
Comment: NORMAL
Neisseria Gonorrhea: NEGATIVE

## 2024-05-28 LAB — HEMOGLOBIN A1C
Est. average glucose Bld gHb Est-mCnc: 114 mg/dL
Hgb A1c MFr Bld: 5.6 % (ref 4.8–5.6)

## 2024-05-28 LAB — PROTEIN / CREATININE RATIO, URINE
Creatinine, Urine: 230.2 mg/dL
Protein, Ur: 22.5 mg/dL
Protein/Creat Ratio: 98 mg/g{creat} (ref 0–200)

## 2024-05-28 LAB — HCV INTERPRETATION

## 2024-05-31 LAB — URINE CULTURE, OB REFLEX

## 2024-05-31 LAB — CULTURE, OB URINE

## 2024-06-02 LAB — PANORAMA PRENATAL TEST FULL PANEL:PANORAMA TEST PLUS 5 ADDITIONAL MICRODELETIONS: FETAL FRACTION: 10.5

## 2024-06-03 ENCOUNTER — Encounter: Payer: Self-pay | Admitting: General Practice

## 2024-06-03 ENCOUNTER — Ambulatory Visit: Payer: Self-pay | Admitting: Family Medicine

## 2024-06-03 MED ORDER — CEFDINIR 300 MG PO CAPS: 300.0000 mg | ORAL_CAPSULE | Freq: Two times a day (BID) | ORAL | 0 refills | Status: AC

## 2024-06-27 ENCOUNTER — Other Ambulatory Visit: Payer: Self-pay

## 2024-06-27 ENCOUNTER — Ambulatory Visit: Payer: Self-pay | Admitting: Obstetrics and Gynecology

## 2024-06-27 VITALS — BP 127/70 | HR 96 | Wt 195.9 lb

## 2024-06-27 DIAGNOSIS — Z23 Encounter for immunization: Secondary | ICD-10-CM | POA: Diagnosis not present

## 2024-06-27 DIAGNOSIS — Z8759 Personal history of other complications of pregnancy, childbirth and the puerperium: Secondary | ICD-10-CM | POA: Diagnosis not present

## 2024-06-27 DIAGNOSIS — O99210 Obesity complicating pregnancy, unspecified trimester: Secondary | ICD-10-CM | POA: Insufficient documentation

## 2024-06-27 DIAGNOSIS — Z3A19 19 weeks gestation of pregnancy: Secondary | ICD-10-CM

## 2024-06-27 DIAGNOSIS — G4710 Hypersomnia, unspecified: Secondary | ICD-10-CM

## 2024-06-27 DIAGNOSIS — Z3492 Encounter for supervision of normal pregnancy, unspecified, second trimester: Secondary | ICD-10-CM

## 2024-06-27 DIAGNOSIS — Z3490 Encounter for supervision of normal pregnancy, unspecified, unspecified trimester: Secondary | ICD-10-CM

## 2024-06-27 NOTE — Progress Notes (Signed)
" ° °  PRENATAL VISIT NOTE  Subjective:  Veronica Jones is a 34 y.o. [phone] at [redacted]w[redacted]d being seen today for ongoing prenatal care.  She is currently monitored for the following issues for this high-risk pregnancy and has History of eclampsia; Anxiety; and Encounter for supervision of low-risk pregnancy, antepartum on their problem list.  Patient doing well with no acute concerns today. She reports increased fatigue/somnolence.   . Vag. Bleeding: None.  Movement: Present. Denies leaking of fluid. Pt does note increased fatigue /somnolence and has difficulty staying awake.  The following portions of the patient's history were reviewed and updated as appropriate: allergies, current medications, past family history, past medical history, past social history, past surgical history and problem list. Problem list updated.  Objective:   Vitals:   06/27/24 1113  BP: 127/70  Pulse: 96  Weight: 195 lb 14.4 oz (88.9 kg)    Fetal Status: Fetal Heart Rate (bpm): 153 Fundal Height: 19 cm Movement: Present     General:  Alert, oriented and cooperative. Patient is in no acute distress.  Skin: Skin is warm and dry. No rash noted.   Cardiovascular: Normal heart rate noted  Respiratory: Normal respiratory effort, no problems with respiration noted  Abdomen: Soft, gravid, appropriate for gestational age.  Pain/Pressure: Present     Pelvic: Cervical exam deferred        Extremities: Normal range of motion.     Mental Status:  Normal mood and affect. Normal behavior. Normal judgment and thought content.   Assessment and Plan:  Pregnancy: [id number] at [redacted]w[redacted]d  1. Encounter for supervision of low-risk pregnancy, antepartum (Primary) Continue routine prenatal care - AFP, Serum, Open Spina Bifida - Flu vaccine trivalent PF, 6mos and older(Flulaval,Afluria,Fluarix,Fluzone)  2. [redacted] weeks gestation of pregnancy   3. History of eclampsia Watch BP, no s/sx of preeclampsia  4. Disorder of excessive  somnolence Unsure if patient has sleep apnea,refer to neuro for potential further eval h/h and TSH normal. - Ambulatory referral to Neurology  Preterm labor symptoms and general obstetric precautions including but not limited to vaginal bleeding, contractions, leaking of fluid and fetal movement were reviewed in detail with the patient.  Please refer to After Visit Summary for other counseling recommendations.   Return in about 4 weeks (around 07/25/2024) for ROB, in person.   Jerilynn Buddle, MD Faculty Attending Center for [facility]   "

## 2024-06-29 LAB — AFP, SERUM, OPEN SPINA BIFIDA
AFP MoM: 1.05
AFP Value: 47.9 ng/mL
Gest. Age on Collection Date: 19 wk
Maternal Age At EDD: 31.7 a
OSBR Risk 1 IN: 10000
Test Results:: NEGATIVE
Weight: 196 [lb_av]

## 2024-07-01 ENCOUNTER — Ambulatory Visit: Payer: Self-pay | Admitting: Obstetrics and Gynecology
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
## 2024-07-11 ENCOUNTER — Ambulatory Visit: Attending: Obstetrics and Gynecology | Admitting: Obstetrics

## 2024-07-11 ENCOUNTER — Ambulatory Visit (HOSPITAL_BASED_OUTPATIENT_CLINIC_OR_DEPARTMENT_OTHER)

## 2024-07-11 VITALS — BP 116/64 | HR 97

## 2024-07-11 DIAGNOSIS — Z3490 Encounter for supervision of normal pregnancy, unspecified, unspecified trimester: Secondary | ICD-10-CM | POA: Diagnosis not present

## 2024-07-11 DIAGNOSIS — O99212 Obesity complicating pregnancy, second trimester: Secondary | ICD-10-CM | POA: Diagnosis not present

## 2024-07-11 DIAGNOSIS — Z3A21 21 weeks gestation of pregnancy: Secondary | ICD-10-CM | POA: Insufficient documentation

## 2024-07-11 DIAGNOSIS — Z3689 Encounter for other specified antenatal screening: Secondary | ICD-10-CM | POA: Insufficient documentation

## 2024-07-11 DIAGNOSIS — Z8759 Personal history of other complications of pregnancy, childbirth and the puerperium: Secondary | ICD-10-CM | POA: Diagnosis not present

## 2024-07-11 DIAGNOSIS — O99210 Obesity complicating pregnancy, unspecified trimester: Secondary | ICD-10-CM

## 2024-07-11 DIAGNOSIS — O09292 Supervision of pregnancy with other poor reproductive or obstetric history, second trimester: Secondary | ICD-10-CM | POA: Insufficient documentation

## 2024-07-11 DIAGNOSIS — Z363 Encounter for antenatal screening for malformations: Secondary | ICD-10-CM | POA: Insufficient documentation

## 2024-07-11 NOTE — Progress Notes (Signed)
 MFM Consult Note  Veronica Jones is currently at [redacted]w[redacted]d. She was seen today for a detailed fetal anatomy scan due to maternal obesity.    She reports a history of postpartum eclampsia following the delivery of her daughter almost 10 years ago.  She denies any other significant past medical history and denies any problems in her current pregnancy.    She had a cell free DNA test earlier in her pregnancy which indicated a low risk for trisomy 27, 11, and 13. A female fetus is predicted.   Sonographic findings Single intrauterine pregnancy at 21w 3d. Fetal cardiac activity:  Observed and appears normal. Presentation: Cephalic. The views of the fetal anatomy were limited today due to the fetal position.  What was visualized today appeared within normal limits. Fetal biometry shows the estimated fetal weight of 0 lb 15 oz, 412 grams (36%). Amniotic fluid: Within normal limits.  MVP: 4.82 cm. Placenta: Anterior. Adnexa: No abnormality visualized. Cervical length: 3.2 cm.  The patient was informed that anomalies may be missed due to technical limitations. If the fetus is in a suboptimal position or maternal habitus is increased, visualization of the fetus in the maternal uterus may be impaired.  Due to her history of postpartum preeclampsia in her prior pregnancy, she was advised to continue taking a daily baby aspirin  for preeclampsia prophylaxis.  A follow-up exam was scheduled in 4 weeks to assess the fetal growth and to complete the views of the fetal anatomy.  The patient stated that all of her questions were answered.   A total of 30 minutes was spent counseling, documenting, and coordinating the care for this patient.  Greater than 50% of the time was spent in direct face-to-face contact.

## 2024-07-26 ENCOUNTER — Encounter: Payer: Self-pay | Admitting: Student
